# Patient Record
Sex: Female | Born: 1975 | Race: White | Hispanic: No | Marital: Married | Smoking: Never smoker
Health system: Southern US, Community
[De-identification: ages and names within clinical notes are randomized; demographics above are authoritative.]

## PROBLEM LIST (undated history)

## (undated) DIAGNOSIS — J329 Chronic sinusitis, unspecified: Secondary | ICD-10-CM

## (undated) DIAGNOSIS — A048 Other specified bacterial intestinal infections: Secondary | ICD-10-CM

## (undated) DIAGNOSIS — K529 Noninfective gastroenteritis and colitis, unspecified: Secondary | ICD-10-CM

## (undated) DIAGNOSIS — K209 Esophagitis, unspecified without bleeding: Secondary | ICD-10-CM

## (undated) HISTORY — DX: Esophagitis, unspecified without bleeding: K20.90

## (undated) HISTORY — PX: TONSILLECTOMY: SUR1361

## (undated) HISTORY — DX: Other specified bacterial intestinal infections: A04.8

## (undated) HISTORY — PX: WISDOM TOOTH EXTRACTION: SHX21

## (undated) HISTORY — DX: Esophagitis, unspecified: K20.9

---

## 1999-02-15 ENCOUNTER — Other Ambulatory Visit: Admission: RE | Admit: 1999-02-15 | Discharge: 1999-02-15 | Payer: Self-pay | Admitting: Obstetrics & Gynecology

## 1999-03-01 ENCOUNTER — Ambulatory Visit (HOSPITAL_COMMUNITY): Admission: RE | Admit: 1999-03-01 | Discharge: 1999-03-01 | Payer: Self-pay | Admitting: Obstetrics & Gynecology

## 1999-07-13 ENCOUNTER — Other Ambulatory Visit: Admission: RE | Admit: 1999-07-13 | Discharge: 1999-07-13 | Payer: Self-pay | Admitting: Obstetrics & Gynecology

## 2000-03-24 ENCOUNTER — Ambulatory Visit (HOSPITAL_COMMUNITY): Admission: RE | Admit: 2000-03-24 | Discharge: 2000-03-24 | Payer: Self-pay | Admitting: Obstetrics and Gynecology

## 2000-06-06 ENCOUNTER — Inpatient Hospital Stay (HOSPITAL_COMMUNITY): Admission: AD | Admit: 2000-06-06 | Discharge: 2000-06-08 | Payer: Self-pay | Admitting: Obstetrics & Gynecology

## 2000-07-19 ENCOUNTER — Other Ambulatory Visit: Admission: RE | Admit: 2000-07-19 | Discharge: 2000-07-19 | Payer: Self-pay | Admitting: Obstetrics and Gynecology

## 2001-08-15 ENCOUNTER — Other Ambulatory Visit: Admission: RE | Admit: 2001-08-15 | Discharge: 2001-08-15 | Payer: Self-pay | Admitting: Obstetrics & Gynecology

## 2003-01-15 ENCOUNTER — Encounter: Admission: RE | Admit: 2003-01-15 | Discharge: 2003-01-15 | Payer: Self-pay | Admitting: *Deleted

## 2003-01-15 ENCOUNTER — Encounter: Payer: Self-pay | Admitting: *Deleted

## 2003-02-13 ENCOUNTER — Ambulatory Visit (HOSPITAL_COMMUNITY): Admission: RE | Admit: 2003-02-13 | Discharge: 2003-02-13 | Payer: Self-pay | Admitting: Emergency Medicine

## 2003-02-13 ENCOUNTER — Encounter: Payer: Self-pay | Admitting: Emergency Medicine

## 2003-02-14 ENCOUNTER — Other Ambulatory Visit: Admission: RE | Admit: 2003-02-14 | Discharge: 2003-02-14 | Payer: Self-pay | Admitting: Obstetrics & Gynecology

## 2004-06-14 ENCOUNTER — Other Ambulatory Visit: Admission: RE | Admit: 2004-06-14 | Discharge: 2004-06-14 | Payer: Self-pay | Admitting: Obstetrics & Gynecology

## 2004-12-21 ENCOUNTER — Other Ambulatory Visit: Admission: RE | Admit: 2004-12-21 | Discharge: 2004-12-21 | Payer: Self-pay | Admitting: Obstetrics & Gynecology

## 2005-09-02 ENCOUNTER — Emergency Department (HOSPITAL_COMMUNITY): Admission: EM | Admit: 2005-09-02 | Discharge: 2005-09-02 | Payer: Self-pay | Admitting: Family Medicine

## 2005-09-05 ENCOUNTER — Ambulatory Visit (HOSPITAL_COMMUNITY): Admission: RE | Admit: 2005-09-05 | Discharge: 2005-09-05 | Payer: Self-pay | Admitting: Emergency Medicine

## 2007-10-03 ENCOUNTER — Emergency Department (HOSPITAL_COMMUNITY): Admission: EM | Admit: 2007-10-03 | Discharge: 2007-10-03 | Payer: Self-pay | Admitting: Emergency Medicine

## 2008-06-18 ENCOUNTER — Emergency Department (HOSPITAL_COMMUNITY): Admission: EM | Admit: 2008-06-18 | Discharge: 2008-06-18 | Payer: Self-pay | Admitting: Family Medicine

## 2008-06-30 ENCOUNTER — Emergency Department (HOSPITAL_COMMUNITY): Admission: EM | Admit: 2008-06-30 | Discharge: 2008-06-30 | Payer: Self-pay | Admitting: Family Medicine

## 2009-11-24 ENCOUNTER — Ambulatory Visit: Payer: Self-pay | Admitting: Internal Medicine

## 2009-11-24 DIAGNOSIS — J309 Allergic rhinitis, unspecified: Secondary | ICD-10-CM | POA: Insufficient documentation

## 2009-11-24 DIAGNOSIS — D509 Iron deficiency anemia, unspecified: Secondary | ICD-10-CM

## 2009-11-24 DIAGNOSIS — R51 Headache: Secondary | ICD-10-CM

## 2009-11-24 DIAGNOSIS — R519 Headache, unspecified: Secondary | ICD-10-CM | POA: Insufficient documentation

## 2009-12-25 ENCOUNTER — Ambulatory Visit: Payer: Self-pay | Admitting: Internal Medicine

## 2010-01-27 ENCOUNTER — Ambulatory Visit: Payer: Self-pay | Admitting: Internal Medicine

## 2010-02-25 ENCOUNTER — Emergency Department (HOSPITAL_COMMUNITY): Admission: EM | Admit: 2010-02-25 | Discharge: 2010-02-25 | Payer: Self-pay | Admitting: Emergency Medicine

## 2010-04-29 ENCOUNTER — Emergency Department (HOSPITAL_COMMUNITY): Admission: EM | Admit: 2010-04-29 | Discharge: 2010-04-29 | Payer: Self-pay | Admitting: Emergency Medicine

## 2010-09-28 NOTE — Assessment & Plan Note (Signed)
Summary: new / umr / # /cd   Vital Signs:  Patient profile:   35 year old female Menstrual status:  regular LMP:     11/02/2009 Height:      63 inches Weight:      162.75 pounds BMI:     28.93 O2 Sat:      98 % on Room air Temp:     98.4 degrees F oral Pulse rate:   69 / minute Pulse rhythm:   regular Resp:     16 per minute BP sitting:   112 / 68  (left arm) Cuff size:   large  Vitals Entered By: Rock Nephew CMA (November 24, 2009 9:22 AM)  Nutrition Counseling: Patient's BMI is greater than 25 and therefore counseled on weight management options.  O2 Flow:  Room air CC: hot flashes, headache x 2 wks, Headaches, Headache Is Patient Diabetic? No Pain Assessment Patient in pain? yes     Location: head Type: throbbing Onset of pain  Constant LMP (date): 11/02/2009     Menstrual Status regular Enter LMP: 11/02/2009   Primary Care Provider:  Etta Grandchild MD  CC:  hot flashes, headache x 2 wks, Headaches, and Headache.  History of Present Illness:  Headaches      This is a 35 year old woman who presents with Headaches.  The patient reports sweats, but denies nausea, vomiting, tearing of eyes, nasal congestion, sinus pain, sinus pressure, photophobia, and phonophobia.  The headache is described as constant and throbbing.  The location of the pain is bitemporal.  The patient denies the following high-risk features: fever, neck pain/stiffness, vision loss or change, focal weakness, altered mental status, rash, trauma, pain worse with exertion, new type of headache, age >36 years, immunosuppression, concomitant infection, and anticoagulation use.  The headaches are precipitated by stress.  Prior treatment has included acetaminophen.    Headache HPI (Reviewed):      The patient comes in for an acute visit for headaches.  The current headache started approximately 11/16/2009.  She notes 5+ previous headaches similar to the current one.  The headaches will last anywhere from  30 minutes to several days at a time.  She has approximately 1 headaches per month.  Headaches have been occurring since age 35.  There is no family history of migraine headaches.        On a scale of 1-10, she describes the headache as a 3.  The headaches are not associated with an aura.  The location of the headaches are bitemporal.  Headache quality is throbbing or pulsating.        The patient denies first or worst H/A of life, change in frequency from prior H/A's, change in severity from prior H/A's, change in features from prior H/A's, new onset H/A's in middle-age or later, new or progressive H/A lasting days, H/A's with Valsalva (cough/sneeze), mylagia, fever, malaise, weight loss, scalp tenderness, jaw claudication, focal neurologic symptoms, confusion, seizures, and impaired level of consciousness.         Headache Treatment History:      She has tried acetaminophen-plain which was ineffective.     Preventive Screening-Counseling & Management  Alcohol-Tobacco     Alcohol drinks/day: 0     Smoking Status: never  Caffeine-Diet-Exercise     Does Patient Exercise: no      Drug Use:  no.    Current Medications (verified): 1)  None  Allergies (verified): 1)  ! 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    Physical Exam  General:  alert, well-developed, well-nourished, and well-hydrated.   Head:  normocephalic, atraumatic, no  abnormalities observed, and no abnormalities palpated.   Eyes:  vision grossly intact, pupils equal, pupils round, and pupils reactive to light.   Mouth:  Oral mucosa and oropharynx without lesions or exudates.  Teeth in good repair. Neck:  No deformities, masses, or tenderness noted. Lungs:  Normal respiratory effort, chest expands symmetrically. Lungs are clear to auscultation, no crackles or wheezes. Heart:  Normal rate and regular rhythm. S1 and S2 normal without gallop, murmur, click, rub or other extra sounds. Abdomen:  Bowel sounds positive,abdomen soft and non-tender without masses, organomegaly or hernias noted. Msk:  No deformity or scoliosis noted of thoracic or lumbar spine.   Pulses:  R and L carotid,radial,femoral,dorsalis pedis and posterior tibial pulses are full and equal bilaterally Extremities:  No clubbing, cyanosis, edema, or deformity noted with normal full range of motion of all joints.   Neurologic:  No cranial nerve deficits noted. Station and gait are normal. Plantar reflexes are down-going bilaterally. DTRs are symmetrical throughout. Sensory, motor and coordinative functions appear intact. Skin:  Intact without suspicious lesions or rashes Cervical Nodes:  no anterior cervical adenopathy and no posterior cervical adenopathy.   Axillary Nodes:  no R axillary adenopathy and no L axillary adenopathy.   Psych:  Cognition and judgment appear intact. Alert and cooperative with normal attention span and concentration. No apparent delusions, illusions, hallucinations  Headache Neurologic Exam:    Cranial Nerves Intact:  Yes    Focal Neurologic Deficit:  No    Motor Exam/Strength-Left:       Left Biceps Flexion ( ):  normal       Left Triceps Extension ( ):  normal       Left Hand Grasp ( ):  normal       Left Deltoid ( ):  normal       Left Hip Flexors ( ):  normal       Left Ankle Dorsiflexion (L5,L4):  normal       Left Great Toe Dorsiflexion (L5,L4):  normal        Left Heel Walk (L5,some L4):  normal       Left Single Squat & Rise-Quads (L4):  normal       Left Toe Walk-calf (S1):  normal    Motor Exam/Strength-Right:       Right Biceps Flexion ( ):  normal       Right Triceps Extension ( ):  normal       Right Hand Grasp ( ):  normal       Right Deltoid ( ):  normal       Right Hip Flexors ( ):  normal       Right Ankle Dorsiflexion (L5,L4):  normal       Right Great Toe Dorsiflexion (L5,L4):  normal       Right Heel Walk (L5,some L4):  normal       Right  Single Squat & Rise-Quads (L4):  normal       Right Toe Walk-calf (S1):  normal    Sensory Exam/Pinprick-Left:       Left Face:  normal       Left Upper Extremity:  normal       Left Lower Extremity:  normal       Left Medial Foot (L4):  normal       Left Dorsal Foot (L5):  normal       Left Lateral Foot (S1):  normal    Sensory Exam/Pinprick-Right:       Right Face:  normal       Right Upper Extremity:  normal       Right Lower Extremity:  normal       Right Medial Foot (L4):  normal       Right Dorsal Foot (L5):  normal       Right Lateral Foot (S1):  normal    Reflexes-Left:       Left Biceps ( ):  normal       Left Triceps ( ):  normal       Left Brachioradialis ( ):  normal       Left Knee Jerk (L4):  normal       Left Ankle Reflex (S1):  normal    Reflexes-Right:       Right Biceps ( ):  normal       Right Triceps ( ):  normal       Right Brachioradialis ( ):  normal       Right Knee Jerk (L4):  normal       Right Ankle Reflex (S1):  normal    Cerebellar:  normal    Gait:  normal    Speech:  normal   Impression & Recommendations:  Problem # 1:  HEADACHE (ICD-784.0) Assessment New  this sounds like a protracted migraine so she was given a dose of Sumavel in the office today. she recevied some relief from the HA but she is not inerested in the injection form of a triptan so she was given by mouth sample doses of a triptan.  Headache diary reviewed.  Her updated  medication list for this problem includes:    Maxalt 10 Mg Tabs (Rizatriptan benzoate) .Marland Kitchen... Take one as directed for migraine headache  Complete Medication List: 1)  Maxalt 10 Mg Tabs (Rizatriptan benzoate) .... Take one as directed for migraine headache  Other Orders: Admin of Therapeutic Inj  intramuscular or subcutaneous (16109) Depo- Medrol 40mg  (J1030) Depo- Medrol 80mg  (J1040)  Headache Assessment/Plan:    Headache Classification:  Migraine without aura  Patient Instructions: 1)  Please schedule a follow-up appointment in 1 month. Prescriptions: MAXALT 10 MG TABS (RIZATRIPTAN BENZOATE) take one as directed for migraine headache  #2 x 0   Entered and Authorized by:   Etta Grandchild MD   Signed by:   Etta Grandchild MD on 11/24/2009   Method used:   Samples Given   RxID:   [id number]    Medication Administration  Injection # 1:    Medication: Depo- Medrol 80mg     Diagnosis: ALLERGIC RHINITIS (ICD-477.9)    Route: IM    Site: R deltoid    Exp Date: 06/2012    Lot #: [id number]    Mfr: pfizer    Patient tolerated injection without complications    Given by: Rock Nephew CMA (November 24, 2009 9:56 AM)  Injection #  2:    Medication: Depo- Medrol 40mg     Diagnosis: ALLERGIC RHINITIS (ICD-477.9)    Route: IM    Site: R deltoid    Exp Date: 06/2012    Lot #: [id number]    Mfr: pfizer    Patient tolerated injection without complications    Given by: Rock Nephew CMA (November 24, 2009 9:56 AM)  Orders Added: 1)  Admin of Therapeutic Inj  intramuscular or subcutaneous [96372] 2)  Depo- Medrol 40mg  [J1030] 3)  Depo- Medrol 80mg  [J1040] 4)  New Patient Level IV [16109]

## 2010-09-28 NOTE — Assessment & Plan Note (Signed)
Summary: 1 MO ROV / NWS  #   Vital Signs:  Patient profile:   35 year old female Menstrual status:  regular Height:      63 inches Weight:      160 pounds BMI:     28.45 O2 Sat:      97 % on Room air Temp:     98.0 degrees F oral Pulse rate:   74 / minute Pulse rhythm:   regular Resp:     16 per minute BP sitting:   108 / 72  (left arm) Cuff size:   large  Vitals Entered By: Rock Nephew CMA (December 25, 2009 9:33 AM)  Nutrition Counseling: Patient's BMI is greater than 25 and therefore counseled on weight management options.  O2 Flow:  Room air CC: follow-up visit Is Patient Diabetic? No Pain Assessment Patient in pain? no        Primary Care Provider:  Etta Grandchild MD  CC:  follow-up visit.  History of Present Illness:  Follow-Up Visit      This is a 35 year old woman who presents for Follow-up visit.  The patient denies dizziness and syncope.  Since the last visit the patient notes no new problems or concerns.  The patient reports exercising occasionaly.  When questioned about possible medication side effects, the patient notes none.  Her HA resolved and she has not had to take any Maxalt. She feels well today.  Preventive Screening-Counseling & Management  Alcohol-Tobacco     Alcohol drinks/day: 0     Smoking Status: never  Hep-HIV-STD-Contraception     Hepatitis Risk: no risk noted     HIV Risk: no risk noted     STD Risk: no risk noted      Sexual History:  currently monogamous.        Drug Use:  never.        Blood Transfusions:  no.    Medications Prior to Update: 1)  Maxalt 10 Mg Tabs (Rizatriptan Benzoate) .... Take One As Directed For Migraine Headache  Current Medications (verified): 1)  Maxalt 10 Mg Tabs (Rizatriptan Benzoate) .... Take One As Directed For Migraine Headache  Allergies (verified): 1)  ! Amoxicillin  Past History:  Past Medical History: Reviewed history from 11/24/2009 and no changes required. Headache Anemia-iron  deficiency Allergic rhinitis  Past Surgical History: Reviewed history from 11/24/2009 and no changes required. Tonsillectomy  Family History: Reviewed history from 11/24/2009 and no changes required. Family History Thyroid disease  Social History: Reviewed history from 11/24/2009 and no changes required. 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  Physical Exam  General:  alert, well-developed, well-nourished, and well-hydrated.   Mouth:  Oral mucosa and oropharynx without lesions or exudates.  Teeth in good repair. Neck:  No deformities, masses, or tenderness noted. Lungs:  Normal respiratory effort, chest expands symmetrically. Lungs are clear to auscultation, no crackles or wheezes. Heart:  Normal rate and regular rhythm. S1 and S2 normal without gallop, murmur, click, rub or other extra sounds. Abdomen:  Bowel sounds positive,abdomen soft and non-tender without masses, organomegaly or hernias  noted. Msk:  No deformity or scoliosis noted of thoracic or lumbar spine.   Extremities:  No clubbing, cyanosis, edema, or deformity noted with normal full range of motion of all joints.   Neurologic:  No cranial nerve deficits noted. Station and gait are normal. Plantar reflexes are down-going bilaterally. DTRs are symmetrical throughout. Sensory, motor and coordinative functions appear intact. Skin:  Intact without suspicious lesions or rashes Psych:  Cognition and judgment appear intact. Alert and cooperative with normal attention span and concentration. No  apparent delusions, illusions, hallucinations   Impression & Recommendations:  Problem # 1:  HEADACHE (ICD-784.0) Assessment Improved  Her updated medication list for this problem includes:    Maxalt 10 Mg Tabs (Rizatriptan benzoate) .Marland Kitchen... Take one as directed for migraine headache  Headache diary reviewed.  Complete Medication List: 1)  Maxalt 10 Mg Tabs (Rizatriptan benzoate) .... Take one as directed for migraine headache  PAP Screening:    Hx Cervical Dysplasia in last 5 yrs? No    3 normal PAP smears in last 5 yrs? Yes    Reviewed PAP smear recommendations:  patient defers to GYN provider  Osteoporosis Risk Assessment:  Risk Factors for Fracture or Low Bone Density:   Race (White or Asian):     yes   Smoking status:       never  Immunization & Chemoprophylaxis:    Tetanus vaccine: Td  (05/14/2003)  Patient Instructions: 1)  Please schedule a follow-up appointment as needed.   Tetanus/Td Immunization History:    Tetanus/Td # 1:  Td (05/14/2003)

## 2011-04-08 ENCOUNTER — Inpatient Hospital Stay (INDEPENDENT_AMBULATORY_CARE_PROVIDER_SITE_OTHER)
Admission: RE | Admit: 2011-04-08 | Discharge: 2011-04-08 | Disposition: A | Discharge status: Home / Self Care | Payer: 59 | Source: Ambulatory Visit | Attending: Family Medicine | Admitting: Family Medicine

## 2011-04-08 DIAGNOSIS — R198 Other specified symptoms and signs involving the digestive system and abdomen: Secondary | ICD-10-CM

## 2011-05-30 LAB — POCT PREGNANCY, URINE: Preg Test, Ur: NEGATIVE

## 2011-08-28 ENCOUNTER — Emergency Department (HOSPITAL_COMMUNITY)
Admission: EM | Admit: 2011-08-28 | Discharge: 2011-08-28 | Disposition: A | Discharge status: Home / Self Care | Payer: 59 | Source: Home / Self Care | Attending: Emergency Medicine | Admitting: Emergency Medicine

## 2011-08-28 DIAGNOSIS — J329 Chronic sinusitis, unspecified: Secondary | ICD-10-CM

## 2011-08-28 DIAGNOSIS — H6500 Acute serous otitis media, unspecified ear: Secondary | ICD-10-CM

## 2011-08-28 HISTORY — DX: Chronic sinusitis, unspecified: J32.9

## 2011-08-28 MED ORDER — IBUPROFEN 600 MG PO TABS: 600.0000 mg | ORAL_TABLET | Freq: Four times a day (QID) | ORAL | Status: DC | PRN

## 2011-08-28 MED ORDER — DOXYCYCLINE HYCLATE 100 MG PO CAPS: 100.0000 mg | ORAL_CAPSULE | Freq: Two times a day (BID) | ORAL | Status: DC

## 2011-08-28 MED ORDER — PSEUDOEPHEDRINE-GUAIFENESIN ER 120-1200 MG PO TB12: 1.0000 | ORAL_TABLET | Freq: Two times a day (BID) | ORAL | Status: DC

## 2011-08-28 MED ORDER — FLUTICASONE PROPIONATE 50 MCG/ACT NA SUSP: 2.0000 | Freq: Every day | NASAL | Status: DC

## 2011-08-28 NOTE — ED Provider Notes (Signed)
History     CSN: [id number]  Arrival date & time 08/28/11  0935   First MD Initiated Contact with Patient 08/28/11 1023      Chief Complaint  Patient presents with  . Facial Pain    HPI Comments: Pt with URI like sx a week ago. C/o nasal congestion, postnasal drip, ST, nonproductive cough, ear fullness. Got better but several days ago c/o bilateral frontal sinus pain/pressure worse with bending forward/lying down, purulent nasal d/c, L ear pain. No fevers, N/V, other HA, bodyaches, dental pain, change in hearing. Taking otc cold meds w/o relief. No known sick contacts. States feels simlar to prev episodes of sinusitis.     The history is provided by the patient.    Past Medical History  Diagnosis Date  . Sinusitis     Past Surgical History  Procedure Date  . Tonsillectomy     History reviewed. No pertinent family history.  History  Substance Use Topics  . Smoking status: Never Smoker   . Smokeless tobacco: Not on file  . Alcohol Use: No    OB History    Grav Para Term Preterm Abortions TAB SAB Ect Mult Living                  Review of Systems  Constitutional: Negative.  Negative for fever.  HENT: Positive for ear pain, congestion, sore throat, postnasal drip and sinus pressure. Negative for hearing loss, facial swelling, trouble swallowing, dental problem, voice change and ear discharge.   Gastrointestinal: Negative for nausea and vomiting.  Skin: Negative for rash.  Neurological: Positive for headaches.    Allergies  Amoxicillin  Home Medications   Current Outpatient Rx  Name Route Sig Dispense Refill  . ASPIRIN EFFERVESCENT 325 MG PO TBEF Oral Take 325 mg by mouth every 6 (six) hours as needed.      Marland Kitchen DOXYCYCLINE HYCLATE 100 MG PO CAPS Oral Take 1 capsule (100 mg total) by mouth 2 (two) times daily. X 7 days 14 capsule 0  . FLUTICASONE PROPIONATE 50 MCG/ACT NA SUSP Nasal Place 2 sprays into the nose daily. 16 g 0  . IBUPROFEN 600 MG PO TABS Oral Take  1 tablet (600 mg total) by mouth every 6 (six) hours as needed for pain. 30 tablet 0  . PSEUDOEPHEDRINE-GUAIFENESIN [phone] MG PO TB12 Oral Take 1 tablet by mouth 2 (two) times daily. 20 each 0    BP 120/82  Pulse 65  Temp(Src) 98.1 F (36.7 C) (Oral)  Resp 14  SpO2 98%  LMP 08/23/2011  Physical Exam  Nursing note and vitals reviewed. Constitutional: She is oriented to person, place, and time. She appears well-developed and well-nourished. No distress.  HENT:  Head: Normocephalic and atraumatic.  Right Ear: Tympanic membrane is not retracted. A middle ear effusion is present.  Left Ear: Tympanic membrane is erythematous and bulging. A middle ear effusion is present.  Nose: Mucosal edema and rhinorrhea present. Right sinus exhibits maxillary sinus tenderness and frontal sinus tenderness. Left sinus exhibits maxillary sinus tenderness and frontal sinus tenderness.  Mouth/Throat: Uvula is midline, oropharynx is clear and moist and mucous membranes are normal.  Eyes: Conjunctivae and EOM are normal. Pupils are equal, round, and reactive to light.  Neck: Normal range of motion. Neck supple.  Cardiovascular: Normal rate, regular rhythm and normal heart sounds.   Pulmonary/Chest: Effort normal and breath sounds normal.  Abdominal: She exhibits no distension.  Musculoskeletal: Normal range of motion.  Lymphadenopathy:  She has no cervical adenopathy.  Neurological: She is alert and oriented to person, place, and time.  Skin: Skin is warm and dry.  Psychiatric: She has a normal mood and affect. Her behavior is normal. Judgment and thought content normal.    ED Course  Procedures (including critical care time)  Labs Reviewed - No data to display No results found.   1. Sinusitis   2. Acute serous otitis media       MDM  Pt with true allergy to amoxicillin. Starting on doxy, decongestants, saline nasal irrigation, will have her use afin at night x 3 days.   Luiz Blare, MD 08/28/11 [id number]

## 2011-08-28 NOTE — ED Notes (Signed)
Pt had cold symptoms that started on Sunday and felt better on Wed and now has sinus congestion and earache bilaterally

## 2011-09-05 ENCOUNTER — Other Ambulatory Visit: Payer: Self-pay

## 2011-09-05 ENCOUNTER — Encounter (HOSPITAL_COMMUNITY): Payer: Self-pay

## 2011-09-05 ENCOUNTER — Emergency Department (INDEPENDENT_AMBULATORY_CARE_PROVIDER_SITE_OTHER)
Admission: EM | Admit: 2011-09-05 | Discharge: 2011-09-05 | Disposition: A | Discharge status: Home / Self Care | Payer: 59 | Source: Home / Self Care

## 2011-09-05 DIAGNOSIS — R55 Syncope and collapse: Secondary | ICD-10-CM

## 2011-09-05 DIAGNOSIS — R51 Headache: Secondary | ICD-10-CM

## 2011-09-05 DIAGNOSIS — K5900 Constipation, unspecified: Secondary | ICD-10-CM

## 2011-09-05 LAB — POCT I-STAT, CHEM 8
BUN: 10 mg/dL (ref 6–23)
Calcium, Ion: 1.14 mmol/L (ref 1.12–1.32)
Chloride: 102 mEq/L (ref 96–112)
Glucose, Bld: 92 mg/dL (ref 70–99)
TCO2: 27 mmol/L (ref 0–100)

## 2011-09-05 MED ORDER — ACETAMINOPHEN 325 MG PO TABS
ORAL_TABLET | ORAL | Status: AC
  Filled 2011-09-05: qty 2

## 2011-09-05 MED ORDER — DOCUSATE SODIUM 100 MG PO CAPS: 100.0000 mg | ORAL_CAPSULE | Freq: Two times a day (BID) | ORAL | Status: AC | PRN

## 2011-09-05 MED ORDER — ONDANSETRON 4 MG PO TBDP
8.0000 mg | ORAL_TABLET | Freq: Once | ORAL | Status: AC
  Administered 2011-09-05: 8 mg via ORAL

## 2011-09-05 MED ORDER — ONDANSETRON 4 MG PO TBDP
ORAL_TABLET | ORAL | Status: AC
  Filled 2011-09-05: qty 2

## 2011-09-05 MED ORDER — ONDANSETRON HCL 4 MG PO TABS: 4.0000 mg | ORAL_TABLET | Freq: Four times a day (QID) | ORAL | Status: AC

## 2011-09-05 MED ORDER — ACETAMINOPHEN 325 MG PO TABS
650.0000 mg | ORAL_TABLET | Freq: Once | ORAL | Status: AC
  Administered 2011-09-05: 650 mg via ORAL

## 2011-09-05 NOTE — ED Notes (Signed)
Pt states the nausea is better

## 2011-09-05 NOTE — ED Notes (Signed)
1 day hx of  dizziness.  This am woke up and went to [facility] and became dizzy.  In addition was diaphoretic.  Pt. states she has headache as well.  Questionable passing out when she went to bathroom.  Was here 1 week ago with sinus infection.  Was tx with Doxycycline and completed dose yesterday.

## 2011-09-09 NOTE — ED Provider Notes (Signed)
History     CSN: [id number]  Arrival date & time 09/05/11  0827   None     Chief Complaint  Patient presents with  . Dizziness    1 day hx of  dizziness.  This am woke up and went to [facility] and became dizzy.  In addition was diaphoretic.  Pt. states she has headache as well.  Questionable passing out when she went to bathroom.      (Consider location/radiation/quality/duration/timing/severity/associated sxs/prior treatment) HPI Comments: Pt states she awoke at 4 or 5 am this morning feeling that she needed to have a BM. While sitting on the commode trying to pass stool she became lightheaded and nauseated. She called for her husband to help her. He states when got to the bathroom she was diaphoretic, and he had to assist her back to the bed. Pt states once she lied down on the bed she felt better and fell back to sleep. She had taken Dulcolax before she went to bed because she had been constipated and was having stomach cramping.  She did have a BM later in the morning after she awoke and feels better as far as the constipation. She has had no more dizziness, or diaphoresis since the episode early this morning. She denies chest pain or palpitations.    Past Medical History  Diagnosis Date  . Sinusitis     Past Surgical History  Procedure Date  . Tonsillectomy   . Tonsillectomy     History reviewed. No pertinent family history.  History  Substance Use Topics  . Smoking status: Never Smoker   . Smokeless tobacco: Not on file  . Alcohol Use: No    OB History    Grav Para Term Preterm Abortions TAB SAB Ect Mult Living                  Review of Systems  Constitutional: Negative for fever and chills.  Respiratory: Negative for cough, chest tightness and shortness of breath.   Cardiovascular: Negative for chest pain and palpitations.  Gastrointestinal: Positive for abdominal pain and constipation. Negative for nausea and vomiting.  Genitourinary: Negative for dysuria and  frequency.  Neurological: Positive for dizziness. Negative for syncope, weakness and headaches.    Allergies  Amoxicillin  Home Medications   Current Outpatient Rx  Name Route Sig Dispense Refill  . FLUTICASONE PROPIONATE 50 MCG/ACT NA SUSP Nasal Place 2 sprays into the nose daily. 16 g 0  . PSEUDOEPHEDRINE-GUAIFENESIN ER [phone] MG PO TB12 Oral Take 1 tablet by mouth 2 (two) times daily. 20 each 0  . DOCUSATE SODIUM 100 MG PO CAPS Oral Take 1 capsule (100 mg total) by mouth 2 (two) times daily as needed for constipation. 20 capsule 0  . ONDANSETRON HCL 4 MG PO TABS Oral Take 1 tablet (4 mg total) by mouth every 6 (six) hours. 12 tablet 0    BP 117/70  Pulse 84  Temp(Src) 98.6 F (37 C) (Oral)  Resp 18  SpO2 99%  LMP 08/23/2011  Physical Exam  Nursing note and vitals reviewed. Constitutional: She appears well-developed and well-nourished. No distress.  HENT:  Head: Normocephalic and atraumatic.  Right Ear: Tympanic membrane, external ear and ear canal normal.  Left Ear: Tympanic membrane, external ear and ear canal normal.  Nose: Nose normal.  Mouth/Throat: Uvula is midline, oropharynx is clear and moist and mucous membranes are normal. No oropharyngeal exudate, posterior oropharyngeal edema or posterior oropharyngeal erythema.  Neck: Neck supple.  Cardiovascular: Normal rate, regular rhythm and normal heart sounds.   Pulmonary/Chest: Effort normal and breath sounds normal. No respiratory distress.  Lymphadenopathy:    She has no cervical adenopathy.  Neurological: She is alert.  Skin: Skin is warm and dry.  Psychiatric: She has a normal mood and affect.    ED Course  Procedures (including critical care time)   Labs Reviewed  POCT I-STAT, CHEM 8  LAB REPORT - SCANNED   No results found.   1. Headache   2. Vasovagal near syncope   3. Constipation       MDM  EKG NSR, rate 72.  Vasovagal episose early this morning. No syncope.         Melody Comas, [place] 09/09/11 1311

## 2011-09-12 NOTE — ED Provider Notes (Signed)
Medical screening examination/treatment/procedure(s) were performed by non-physician practitioner and as supervising physician I was immediately available for consultation/collaboration.  Luiz Blare MD   Luiz Blare, MD 09/12/11 [phone]

## 2012-04-03 ENCOUNTER — Encounter (HOSPITAL_COMMUNITY): Payer: Self-pay | Admitting: Emergency Medicine

## 2012-04-03 ENCOUNTER — Emergency Department (HOSPITAL_COMMUNITY)
Admission: EM | Admit: 2012-04-03 | Discharge: 2012-04-03 | Disposition: A | Discharge status: Home / Self Care | Payer: 59 | Source: Home / Self Care

## 2012-04-03 DIAGNOSIS — J329 Chronic sinusitis, unspecified: Secondary | ICD-10-CM

## 2012-04-03 DIAGNOSIS — J069 Acute upper respiratory infection, unspecified: Secondary | ICD-10-CM

## 2012-04-03 MED ORDER — SALINE NASAL SPRAY 0.65 % NA SOLN: 1.0000 | NASAL | Status: DC | PRN

## 2012-04-03 MED ORDER — CETIRIZINE-PSEUDOEPHEDRINE ER 5-120 MG PO TB12: 1.0000 | ORAL_TABLET | Freq: Every day | ORAL | Status: AC

## 2012-04-03 NOTE — ED Notes (Signed)
A week ago Sunday returned form a camping trip.  This past weekend started coughing, losing voice, non-productive cough, denies fever.  Reports runny nose, reports drainage in throat.

## 2012-04-03 NOTE — ED Provider Notes (Signed)
History     CSN: [id number]  Arrival date & time 04/03/12  1730   None     Chief Complaint  Patient presents with  . URI    (Consider location/radiation/quality/duration/timing/severity/associated sxs/prior treatment) Patient is a 36 y.o. female presenting with URI. The history is provided by the patient.  URI  Shivon is a 36 y.o. female who complains of onset of cough symptoms for 7 days.  No sore throat + cough, non productive No pleuritic pain No wheezing + nasal congestion No post-nasal drainage + sinus pain/pressure No laryngitis No chest congestion No itchy/red eyes + earache No hemoptysis No SOB No chills/sweats No fever No nausea No vomiting No abdominal pain No diarrhea No skin rashes No fatigue No myalgias + headache  No ill contacts   Past Medical History  Diagnosis Date  . Sinusitis     Past Surgical History  Procedure Date  . Tonsillectomy   . Tonsillectomy     No family history on file.  History  Substance Use Topics  . Smoking status: Never Smoker   . Smokeless tobacco: Not on file  . Alcohol Use: No    OB History    Grav Para Term Preterm Abortions TAB SAB Ect Mult Living                  Review of Systems  All other systems reviewed and are negative.    Allergies  Amoxicillin  Home Medications   Current Outpatient Rx  Name Route Sig Dispense Refill  . SUDAFED 12 HOUR PO Oral Take by mouth.    . CETIRIZINE-PSEUDOEPHEDRINE ER 5-120 MG PO TB12 Oral Take 1 tablet by mouth daily. 30 tablet 0  . FLUTICASONE PROPIONATE 50 MCG/ACT NA SUSP Nasal Place 2 sprays into the nose daily. 16 g 0  . PSEUDOEPHEDRINE-GUAIFENESIN ER [phone] MG PO TB12 Oral Take 1 tablet by mouth 2 (two) times daily. 20 each 0  . SALINE NASAL SPRAY 0.65 % NA SOLN Nasal Place 1 spray into the nose as needed for congestion. 30 mL 12    BP 125/75  Pulse 70  Temp 98.5 F (36.9 C) (Oral)  Resp 16  SpO2 100%  LMP 03/12/2012  Physical Exam    Nursing note and vitals reviewed. Constitutional: She is oriented to person, place, and time. Vital signs are normal. She appears well-developed and well-nourished. She is active and cooperative.  HENT:  Head: Normocephalic.  Right Ear: Hearing, tympanic membrane, external ear and ear canal normal.  Left Ear: Hearing, tympanic membrane, external ear and ear canal normal.  Nose: Right sinus exhibits maxillary sinus tenderness. Right sinus exhibits no frontal sinus tenderness. Left sinus exhibits maxillary sinus tenderness. Left sinus exhibits no frontal sinus tenderness.  Mouth/Throat: Uvula is midline, oropharynx is clear and moist and mucous membranes are normal.       Right ear mildly bulging  Eyes: Conjunctivae are normal. Pupils are equal, round, and reactive to light. No scleral icterus.  Neck: Trachea normal. Neck supple.  Cardiovascular: Normal rate, regular rhythm, normal heart sounds and normal pulses.   Pulmonary/Chest: Effort normal and breath sounds normal. She exhibits no tenderness.  Lymphadenopathy:       Head (right side): No submental, no submandibular, no tonsillar, no preauricular, no posterior auricular and no occipital adenopathy present.       Head (left side): No submental, no submandibular, no tonsillar, no preauricular, no posterior auricular and no occipital adenopathy present.    She has  no cervical adenopathy.  Neurological: She is alert and oriented to person, place, and time. No cranial nerve deficit or sensory deficit.  Skin: Skin is warm and dry. No rash noted.  Psychiatric: She has a normal mood and affect. Her speech is normal and behavior is normal. Judgment and thought content normal. Cognition and memory are normal.    ED Course  Procedures (including critical care time)  Labs Reviewed - No data to display No results found.   1. Sinusitis   2. URI (upper respiratory infection)       MDM  Zyrtec daily, saline nasal spray as instructed.  Follow  up with primary care provider if symptoms are not improved.         Johnsie Kindred, NP 04/03/12 2055

## 2012-04-05 NOTE — ED Provider Notes (Signed)
Medical screening examination/treatment/procedure(s) were performed by non-physician practitioner and as supervising physician I was immediately available for consultation/collaboration.  Luiz Blare MD   Luiz Blare, MD 04/05/12 [phone]

## 2013-05-31 ENCOUNTER — Encounter: Payer: Self-pay | Admitting: Internal Medicine

## 2013-05-31 ENCOUNTER — Ambulatory Visit (INDEPENDENT_AMBULATORY_CARE_PROVIDER_SITE_OTHER): Payer: 59 | Admitting: Internal Medicine

## 2013-05-31 VITALS — BP 120/80 | Temp 98.7°F | Wt 173.2 lb

## 2013-05-31 DIAGNOSIS — F329 Major depressive disorder, single episode, unspecified: Secondary | ICD-10-CM

## 2013-05-31 DIAGNOSIS — IMO0002 Reserved for concepts with insufficient information to code with codable children: Secondary | ICD-10-CM

## 2013-05-31 DIAGNOSIS — R209 Unspecified disturbances of skin sensation: Secondary | ICD-10-CM

## 2013-05-31 DIAGNOSIS — T280XXA Burn of mouth and pharynx, initial encounter: Secondary | ICD-10-CM

## 2013-05-31 DIAGNOSIS — F341 Dysthymic disorder: Secondary | ICD-10-CM

## 2013-05-31 DIAGNOSIS — R200 Anesthesia of skin: Secondary | ICD-10-CM

## 2013-05-31 DIAGNOSIS — T285XXA Corrosion of mouth and pharynx, initial encounter: Secondary | ICD-10-CM

## 2013-05-31 MED ORDER — FLUOXETINE HCL 20 MG PO TABS: 20.0000 mg | ORAL_TABLET | Freq: Every day | ORAL | Status: DC

## 2013-05-31 NOTE — Progress Notes (Signed)
Subjective:    Patient ID: Vanessa Byrd, female    DOB: 1975/10/16, 37 y.o.   MRN: [id number]  HPI  Pt presents to the clinic today with c/o tongue numbness. This started on Sunday. She did eat at church for homecoming. She bit into a piece of cornbread that had jalapeno in it. It was very hot. She does not tolerate spicy foods. She felt like it actually burnt the left side of her tongue. Since that time it has been numb. She is also concerned that it may be Bell's Palsy. Her mother and her husband both have had bell's palsy in the past. Additionally, she c/o anxiety and depression. The depression is mild but the anxiety is the issues. She has started to have panic attacks a few times a week. She is under a lot of stress with her job. She has been on Effexor in the past but did not tolerate it well. She denies SI/HI.  Review of Systems      Past Medical History  Diagnosis Date  . Sinusitis     Current Outpatient Prescriptions  Medication Sig Dispense Refill  . fluticasone (FLONASE) 50 MCG/ACT nasal spray Place 2 sprays into the nose daily.  16 g  0  . Pseudoephedrine HCl (SUDAFED 12 HOUR PO) Take by mouth.      . Pseudoephedrine-Guaifenesin (MUCINEX D) [phone] MG TB12 Take 1 tablet by mouth 2 (two) times daily.  20 each  0  . sodium chloride (OCEAN NASAL SPRAY) 0.65 % nasal spray Place 1 spray into the nose as needed for congestion.  30 mL  12   No current facility-administered medications for this visit.    Allergies  Allergen Reactions  . Amoxicillin     REACTION: swelling. angioedema    History reviewed. No pertinent family history.  History   Social History  . Marital Status: Married    Spouse Name: N/A    Number of Children: N/A  . Years of Education: N/A   Occupational History  . Not on file.   Social History Main Topics  . Smoking status: Never Smoker   . Smokeless tobacco: Not on file  . Alcohol Use: No  . Drug Use: No  . Sexual Activity: Not on  file   Other Topics Concern  . Not on file   Social History Narrative  . No narrative on file     Constitutional: Denies fever, malaise, fatigue, headache or abrupt weight changes.  HEENT: Pt reports tongue numbness. Denies eye pain, eye redness, ear pain, ringing in the ears, wax buildup, runny nose, nasal congestion, bloody nose, or sore throat. Skin: Denies redness, rashes, lesions or ulcercations.  Neurological: Denies dizziness, difficulty with memory, difficulty with speech or problems with balance and coordination.  Psych: Pt reports anxiety and depression. Denies SI/HI.  No other specific complaints in a complete review of systems (except as listed in HPI above).  Objective:   Physical Exam   BP 120/80  Temp(Src) 98.7 F (37.1 C) (Oral)  Wt 173 lb 4 oz (78.586 kg)  BMI 30.7 kg/m2  SpO2 99% Wt Readings from Last 3 Encounters:  05/31/13 173 lb 4 oz (78.586 kg)  12/25/09 160 lb (72.576 kg)  11/24/09 162 lb 12 oz (73.823 kg)    General: Appears her stated age,overweight but  well developed, well nourished in NAD. Skin: Warm, dry and intact. No rashes, lesions or ulcerations noted. HEENT: Head: normal shape and size; Eyes: sclera white, no icterus, conjunctiva  pink, PERRLA and EOMs intact; Ears: Tm's gray and intact, normal light reflex; Nose: mucosa pink and moist, septum midline; Throat/Mouth: Teeth present, mucosa pink and moist, no exudate, lesions or ulcerations noted.  Tongue with smooth taste buds on left side ? Burn. Cardiovascular: Normal rate and rhythm. S1,S2 noted.  No murmur, rubs or gallops noted. No JVD or BLE edema. No carotid bruits noted. Pulmonary/Chest: Normal effort and positive vesicular breath sounds. No respiratory distress. No wheezes, rales or ronchi noted.  Neurological: Alert and oriented. Cranial nerves II-XII intact. Coordination normal. +DTRs bilaterally. Psych: Mood anxious but affect normal. Thought content and behavior normal.  BMET     Component Value Date/Time   NA 140 09/05/2011 1018   K 3.5 09/05/2011 1018   CL 102 09/05/2011 1018   GLUCOSE 92 09/05/2011 1018   BUN 10 09/05/2011 1018   CREATININE 0.90 09/05/2011 1018    Lipid Panel  No results found for this basename: chol, trig, hdl, cholhdl, vldl, ldlcalc    CBC    Component Value Date/Time   HGB 15.0 09/05/2011 1018   HCT 44.0 09/05/2011 1018    Hgb A1C No results found for this basename: HGBA1C        Assessment & Plan:   Left tongue numbness, new onset:  Likely chemical burn from jalopeno No evidence of bells palsy- neuro exam normal

## 2013-05-31 NOTE — Assessment & Plan Note (Signed)
Failed effexor Will trial prozac 20 mg  RTC in 1 month to assess medication effectiveness

## 2013-05-31 NOTE — Patient Instructions (Addendum)

## 2013-09-02 ENCOUNTER — Ambulatory Visit: Payer: 59 | Admitting: Internal Medicine

## 2013-09-18 ENCOUNTER — Encounter: Payer: Self-pay | Admitting: Internal Medicine

## 2013-09-18 ENCOUNTER — Ambulatory Visit (INDEPENDENT_AMBULATORY_CARE_PROVIDER_SITE_OTHER): Payer: 59 | Admitting: Internal Medicine

## 2013-09-18 VITALS — BP 122/70 | HR 56 | Temp 98.8°F | Wt 169.5 lb

## 2013-09-18 DIAGNOSIS — J069 Acute upper respiratory infection, unspecified: Secondary | ICD-10-CM

## 2013-09-18 NOTE — Progress Notes (Signed)
HPI  Pt presents to the clinic today with c/o headache, dizziness, ear pain, fatigue, diarrhea and nausea. This started about 4 days ago. The headache seems to come and go. The headache is located near her temples. The diarrhea and nausea seems to have resolved but she still has no appetite. She has had fever, chills or body aches. She has not taken anything OTC. She has had her flu shot. She has had sick contact.  Review of Systems      Past Medical History  Diagnosis Date  . Sinusitis     No family history on file.  History   Social History  . Marital Status: Married    Spouse Name: N/A    Number of Children: N/A  . Years of Education: N/A   Occupational History  . Not on file.   Social History Main Topics  . Smoking status: Never Smoker   . Smokeless tobacco: Not on file  . Alcohol Use: No  . Drug Use: No  . Sexual Activity: Not on file   Other Topics Concern  . Not on file   Social History Narrative  . No narrative on file    Allergies  Allergen Reactions  . Amoxicillin     REACTION: swelling. angioedema     Constitutional: Positive headache, fatigue and fever. Denies abrupt weight changes.  HEENT:  Pt reports ear pain. Denies eye redness, eye pain, pressure behind the eyes, facial pain, nasal congestion, ringing in the ears, wax buildup, runny nose or bloody nose. Respiratory: Denies difficulty breathing or shortness of breath.  Cardiovascular: Denies chest pain, chest tightness, palpitations or swelling in the hands or feet.   No other specific complaints in a complete review of systems (except as listed in HPI above).  Objective:   BP 122/70  Pulse 56  Temp(Src) 98.8 F (37.1 C) (Oral)  Wt 169 lb 8 oz (76.885 kg)  SpO2 98%  LMP 09/08/2013 Wt Readings from Last 3 Encounters:  09/18/13 169 lb 8 oz (76.885 kg)  05/31/13 173 lb 4 oz (78.586 kg)  12/25/09 160 lb (72.576 kg)     General: Appears her stated age, well developed, well nourished in  NAD. HEENT: Head: normal shape and size; Eyes: sclera white, no icterus, conjunctiva pink, PERRLA and EOMs intact; Ears: Tm's gray and intact, normal light reflex; Nose: mucosa pink and moist, septum midline; Throat/Mouth: + PND. Teeth present, mucosa erythematous and moist, no exudate noted, no lesions or ulcerations noted.  Neck: Neck supple, trachea midline. No massses, lumps or thyromegaly present.  Cardiovascular: Normal rate and rhythm. S1,S2 noted.  No murmur, rubs or gallops noted. No JVD or BLE edema. No carotid bruits noted. Pulmonary/Chest: Normal effort and positive vesicular breath sounds. No respiratory distress. No wheezes, rales or ronchi noted.      Assessment & Plan:   Upper Respiratory Infection, likely viral:  Get some rest and drink plenty of water Can take Sudafed or Zyrtec OTC  RTC as needed or if symptoms persist.

## 2013-09-18 NOTE — Progress Notes (Signed)
Pre-visit discussion using our clinic review tool. No additional management support is needed unless otherwise documented below in the visit note.  

## 2013-09-18 NOTE — Patient Instructions (Signed)

## 2013-10-23 ENCOUNTER — Encounter (HOSPITAL_COMMUNITY): Payer: Self-pay | Admitting: Emergency Medicine

## 2013-10-23 ENCOUNTER — Emergency Department (HOSPITAL_COMMUNITY)
Admission: EM | Admit: 2013-10-23 | Discharge: 2013-10-23 | Disposition: A | Discharge status: Home / Self Care | Payer: 59 | Source: Home / Self Care | Attending: Family Medicine | Admitting: Family Medicine

## 2013-10-23 DIAGNOSIS — A048 Other specified bacterial intestinal infections: Secondary | ICD-10-CM

## 2013-10-23 LAB — CBC WITH DIFFERENTIAL/PLATELET
Basophils Absolute: 0 10*3/uL (ref 0.0–0.1)
Basophils Relative: 0 % (ref 0–1)
Eosinophils Absolute: 0.1 10*3/uL (ref 0.0–0.7)
Eosinophils Relative: 1 % (ref 0–5)
HCT: 39.9 % (ref 36.0–46.0)
HEMOGLOBIN: 13.2 g/dL (ref 12.0–15.0)
LYMPHS ABS: 2.6 10*3/uL (ref 0.7–4.0)
Lymphocytes Relative: 28 % (ref 12–46)
MCH: 26.7 pg (ref 26.0–34.0)
MCHC: 33.1 g/dL (ref 30.0–36.0)
MCV: 80.8 fL (ref 78.0–100.0)
MONOS PCT: 5 % (ref 3–12)
Monocytes Absolute: 0.5 10*3/uL (ref 0.1–1.0)
NEUTROS ABS: 6.2 10*3/uL (ref 1.7–7.7)
NEUTROS PCT: 66 % (ref 43–77)
Platelets: 306 10*3/uL (ref 150–400)
RBC: 4.94 MIL/uL (ref 3.87–5.11)
RDW: 14.2 % (ref 11.5–15.5)
WBC: 9.4 10*3/uL (ref 4.0–10.5)

## 2013-10-23 LAB — COMPREHENSIVE METABOLIC PANEL
ALK PHOS: 63 U/L (ref 39–117)
ALT: 16 U/L (ref 0–35)
AST: 14 U/L (ref 0–37)
Albumin: 3.9 g/dL (ref 3.5–5.2)
BILIRUBIN TOTAL: 0.8 mg/dL (ref 0.3–1.2)
BUN: 9 mg/dL (ref 6–23)
CHLORIDE: 104 meq/L (ref 96–112)
CO2: 25 meq/L (ref 19–32)
Calcium: 9.2 mg/dL (ref 8.4–10.5)
Creatinine, Ser: 0.73 mg/dL (ref 0.50–1.10)
GFR calc non Af Amer: >90 mL/min (ref >90)
GLUCOSE: 113 mg/dL — AB (ref 70–99)
POTASSIUM: 3.6 meq/L — AB (ref 3.7–5.3)
Sodium: 141 mEq/L (ref 137–147)
Total Protein: 7 g/dL (ref 6.0–8.3)

## 2013-10-23 LAB — POCT H PYLORI SCREEN: H. PYLORI SCREEN, POC: POSITIVE — AB

## 2013-10-23 LAB — POCT URINALYSIS DIP (DEVICE)
Bilirubin Urine: NEGATIVE
Glucose, UA: NEGATIVE mg/dL
Hgb urine dipstick: NEGATIVE
Ketones, ur: NEGATIVE mg/dL
Nitrite: NEGATIVE
PROTEIN: NEGATIVE mg/dL
Specific Gravity, Urine: 1.02 (ref 1.005–1.030)
UROBILINOGEN UA: 0.2 mg/dL (ref 0.0–1.0)
pH: 6.5 (ref 5.0–8.0)

## 2013-10-23 LAB — LIPASE, BLOOD: LIPASE: 33 U/L (ref 11–59)

## 2013-10-23 LAB — POCT PREGNANCY, URINE: PREG TEST UR: NEGATIVE

## 2013-10-23 MED ORDER — TETRACYCLINE HCL 500 MG PO CAPS: 500.0000 mg | ORAL_CAPSULE | Freq: Four times a day (QID) | ORAL | Status: DC

## 2013-10-23 MED ORDER — ONDANSETRON 4 MG PO TBDP: 4.0000 mg | ORAL_TABLET | Freq: Three times a day (TID) | ORAL | Status: DC | PRN

## 2013-10-23 MED ORDER — ONDANSETRON 4 MG PO TBDP
4.0000 mg | ORAL_TABLET | Freq: Once | ORAL | Status: AC
  Administered 2013-10-23: 4 mg via ORAL

## 2013-10-23 MED ORDER — METRONIDAZOLE 500 MG PO TABS: 500.0000 mg | ORAL_TABLET | Freq: Two times a day (BID) | ORAL | Status: DC

## 2013-10-23 MED ORDER — OMEPRAZOLE 20 MG PO CPDR: 20.0000 mg | DELAYED_RELEASE_CAPSULE | Freq: Two times a day (BID) | ORAL | Status: DC

## 2013-10-23 MED ORDER — ONDANSETRON 4 MG PO TBDP
ORAL_TABLET | ORAL | Status: AC
  Filled 2013-10-23: qty 1

## 2013-10-23 NOTE — ED Notes (Signed)
Pt  Reports  abd   Pain   With  Nausea       Sweaty       X  1  Month  Worse  Recently       This  Am         Felt  Weak   Almost  Passed  Out        But  According to  Husband  Remained  Awake

## 2013-10-23 NOTE — Discharge Instructions (Signed)
Helicobacter Pylori Antibodies Test °This is a blood test which looks for a germ called Helicobacter pylori. This can also be diagnosed by a breath test or a microscopic examination of a portion (biopsy) of the small bowel. H. pylori is a germ that is found in the cells that line the stomach. It is a risk factor for stomach and small bowel ulcers, long standing inflammation of the lining of the stomach, or even ulcers that may occur in the esophagus (the canal that runs from the mouth to the stomach). This bacterium is also a factor in stomach cancer. The amount of the bacteria is found in about 10% of healthy persons younger than 39 years of age and the amount of the bacteria increases with age. Most persons with these bacteria have no symptoms; however, it is thought that when these bacteria cause ulcers, antibiotic medications can be used to help eliminate or reduce the problem.  °PREPARATION FOR TEST °No preparation or fasting is necessary. °NORMAL FINDINGS °Negative (H-pylori bacteria not present) °Ranges for normal findingsmay vary among different laboratories and hospitals. You should always check with your doctor after having lab work or other tests done to discuss the meaning of your test results and whether or not your values are considered within normal limits. °MEANING OF TEST  °Your caregiver will go over the test results with you and discuss the importance and meaning of your results, as well as treatment options and the need for additional tests if necessary. °It is your responsibility to obtain your test results. Ask the lab or department performing the test when and how you will get your results. °OBTAINING THE TEST RESULTS °It is your responsibility to obtain your test results. Ask the lab or department performing the test when and how you will get your results. °Document Released: 09/08/2004 Document Revised: 11/07/2011 Document Reviewed: 07/26/2008 °Exit[employer]® Patient Information ©2014 [employer]. ° °

## 2013-10-23 NOTE — ED Provider Notes (Signed)
CSN: [id number]     Arrival date & time 10/23/13  5093 History   First MD Initiated Contact with Patient 10/23/13 [phone]     Chief Complaint  Patient presents with  . Abdominal Pain     (Consider location/radiation/quality/duration/timing/severity/associated sxs/prior Treatment) Patient is a 38 y.o. female presenting with abdominal pain. The history is provided by the patient. No language interpreter was used.  Abdominal Pain Pain location:  Generalized Pain quality: aching and gnawing   Pain radiates to:  Does not radiate Pain severity:  No pain Onset quality:  Gradual Duration:  4 weeks Progression:  Worsening Chronicity:  New Relieved by:  Nothing Worsened by:  Nothing tried Ineffective treatments:  None tried Associated symptoms: no chest pain, no fever and no vaginal bleeding     Past Medical History  Diagnosis Date  . Sinusitis    Past Surgical History  Procedure Laterality Date  . Tonsillectomy    . Tonsillectomy     No family history on file. History  Substance Use Topics  . Smoking status: Never Smoker   . Smokeless tobacco: Not on file  . Alcohol Use: No   OB History   Grav Para Term Preterm Abortions TAB SAB Ect Mult Living                 Review of Systems  Constitutional: Negative for fever.  Cardiovascular: Negative for chest pain.  Gastrointestinal: Positive for abdominal pain.  Genitourinary: Negative for vaginal bleeding.  All other systems reviewed and are negative.      Allergies  Amoxicillin  Home Medications   Current Outpatient Rx  Name  Route  Sig  Dispense  Refill  . FLUoxetine (PROZAC) 20 MG tablet   Oral   Take 1 tablet (20 mg total) by mouth daily.   30 tablet   3   . EXPIRED: fluticasone (FLONASE) 50 MCG/ACT nasal spray   Nasal   Place 2 sprays into the nose daily.   16 g   0   . Pseudoephedrine HCl (SUDAFED 12 HOUR PO)   Oral   Take by mouth.         . Pseudoephedrine-Guaifenesin (MUCINEX D) [phone] MG  TB12   Oral   Take 1 tablet by mouth 2 (two) times daily.   20 each   0   . EXPIRED: sodium chloride (OCEAN NASAL SPRAY) 0.65 % nasal spray   Nasal   Place 1 spray into the nose as needed for congestion.   30 mL   12    BP 118/72  Pulse 76  Temp(Src) 98.6 F (37 C) (Oral)  Resp 20  SpO2 100%  LMP 10/09/2013 Physical Exam  Nursing note and vitals reviewed. Constitutional: She is oriented to person, place, and time. She appears well-developed and well-nourished.  HENT:  Head: Normocephalic.  Right Ear: External ear normal.  Nose: Nose normal.  Mouth/Throat: Oropharynx is clear and moist.  Eyes: Conjunctivae and EOM are normal. Pupils are equal, round, and reactive to light.  Neck: Normal range of motion.  Cardiovascular: Normal rate and normal heart sounds.   Pulmonary/Chest: Effort normal and breath sounds normal.  Abdominal: Bowel sounds are normal. She exhibits no distension. There is tenderness.  Musculoskeletal: Normal range of motion.  Neurological: She is alert and oriented to person, place, and time.  Skin: Skin is warm.  Psychiatric: She has a normal mood and affect.    ED Course  Procedures (including critical care time) Labs Review Labs  Reviewed  POCT URINALYSIS DIP (DEVICE) - Abnormal; Notable for the following:    Leukocytes, UA SMALL (*)    All other components within normal limits  CBC WITH DIFFERENTIAL  COMPREHENSIVE METABOLIC PANEL  LIPASE, BLOOD  URINALYSIS, DIPSTICK ONLY  POCT PREGNANCY, URINE   Imaging Review No results found.    MDM   Final diagnoses:  H. pylori infection    Positive h pylori.    Pt given rx for flagyl. Tetracycline, zofran and prilosec.   Pt advised to see her MD for recheck.   Pt counseled on signs and symptoms of worsening illness that would indicate more severe process and need to go to ED  Fransico Meadow, PA-C 10/23/13 1708

## 2013-10-25 NOTE — ED Provider Notes (Signed)
Medical screening examination/treatment/procedure(s) were performed by a resident physician or non-physician practitioner and as the supervising physician I was immediately available for consultation/collaboration.  Lynne Leader, MD    Gregor Hams, MD 10/25/13 [phone]

## 2013-10-28 ENCOUNTER — Telehealth: Payer: Self-pay

## 2013-10-28 NOTE — Telephone Encounter (Signed)
Pt called and thought she had missed a call from our office. I asked her about Scarlette Calico as PCP and she stated she switched to you back in January at the [facility] office. I informed her that someone will get back to her as soon as you get a chance to look at the note. Pt .agreed and understood

## 2013-10-28 NOTE — Telephone Encounter (Signed)
Pt left v/m; was recently seen at [facility] and pt was advised had H pylori and recommended pt contact PCP for referral to Gastroenterologist.Please advise. [facility] note advised pt to f/u with PCP.  Does pt need to schedule appt with  Webb Silversmith NP. PCP listed as Scarlette Calico.

## 2013-10-28 NOTE — Telephone Encounter (Signed)
She has to be seen before I can place a referral

## 2013-10-29 ENCOUNTER — Telehealth: Payer: Self-pay | Admitting: Internal Medicine

## 2013-10-29 NOTE — Telephone Encounter (Signed)
Pt is aware of no openings for today and she already has an appt for tomorrow and will keep that appt

## 2013-10-29 NOTE — Telephone Encounter (Signed)
Patient needs a referral to GI for H-Pylori.  Patient has appointment tomorrow,but she had a rough night last night and this morning.  Patient is asking if she can be seen sooner than tomorrow.

## 2013-10-30 ENCOUNTER — Encounter: Payer: Self-pay | Admitting: Internal Medicine

## 2013-10-30 ENCOUNTER — Ambulatory Visit (INDEPENDENT_AMBULATORY_CARE_PROVIDER_SITE_OTHER): Payer: 59 | Admitting: Internal Medicine

## 2013-10-30 VITALS — BP 110/76 | HR 77 | Temp 98.4°F | Wt 162.0 lb

## 2013-10-30 DIAGNOSIS — R1013 Epigastric pain: Secondary | ICD-10-CM

## 2013-10-30 DIAGNOSIS — A048 Other specified bacterial intestinal infections: Secondary | ICD-10-CM

## 2013-10-30 DIAGNOSIS — R110 Nausea: Secondary | ICD-10-CM

## 2013-10-30 NOTE — Patient Instructions (Addendum)
Helicobacter Pylori Antibodies Test °This is a blood test which looks for a germ called Helicobacter pylori. This can also be diagnosed by a breath test or a microscopic examination of a portion (biopsy) of the small bowel. H. pylori is a germ that is found in the cells that line the stomach. It is a risk factor for stomach and small bowel ulcers, long standing inflammation of the lining of the stomach, or even ulcers that may occur in the esophagus (the canal that runs from the mouth to the stomach). This bacterium is also a factor in stomach cancer. The amount of the bacteria is found in about 10% of healthy persons younger than 39 years of age and the amount of the bacteria increases with age. Most persons with these bacteria have no symptoms; however, it is thought that when these bacteria cause ulcers, antibiotic medications can be used to help eliminate or reduce the problem.  °PREPARATION FOR TEST °No preparation or fasting is necessary. °NORMAL FINDINGS °Negative (H-pylori bacteria not present) °Ranges for normal findingsmay vary among different laboratories and hospitals. You should always check with your doctor after having lab work or other tests done to discuss the meaning of your test results and whether or not your values are considered within normal limits. °MEANING OF TEST  °Your caregiver will go over the test results with you and discuss the importance and meaning of your results, as well as treatment options and the need for additional tests if necessary. °It is your responsibility to obtain your test results. Ask the lab or department performing the test when and how you will get your results. °OBTAINING THE TEST RESULTS °It is your responsibility to obtain your test results. Ask the lab or department performing the test when and how you will get your results. °Document Released: 09/08/2004 Document Revised: 11/07/2011 Document Reviewed: 07/26/2008 °ExitCare® Patient Information ©2014 [employer]  [employer]. ° °

## 2013-10-30 NOTE — Progress Notes (Signed)
Pre visit review using our clinic review tool, if applicable. No additional management support is needed unless otherwise documented below in the visit note. 

## 2013-10-30 NOTE — Progress Notes (Signed)
Subjective:    Patient ID: Vanessa Byrd, female    DOB: 01-Jul-1976, 38 y.o.   MRN: [id number]  HPI  Pt presents to the clinic today reporting that she has not been feeling well x 1 week. She did have a syncopal episode. She did go to UC where she was diagnosed with H Pylori. She is currently undergoing treatment.The medications have not changed how she is feeling. She continues to have upper abdominal pain, and nausea. She denies vomiting or blood in her stool. The abdominal pain does not change in reference to eating.  She is having loose stools daily.  Review of Systems      Past Medical History  Diagnosis Date  . Sinusitis     Current Outpatient Prescriptions  Medication Sig Dispense Refill  . FLUoxetine (PROZAC) 20 MG tablet Take 1 tablet (20 mg total) by mouth daily.  30 tablet  3  . metroNIDAZOLE (FLAGYL) 500 MG tablet Take 1 tablet (500 mg total) by mouth 2 (two) times daily.  20 tablet  0  . omeprazole (PRILOSEC) 20 MG capsule Take 1 capsule (20 mg total) by mouth 2 (two) times daily before a meal.  28 capsule  0  . ondansetron (ZOFRAN ODT) 4 MG disintegrating tablet Take 1 tablet (4 mg total) by mouth every 8 (eight) hours as needed for nausea or vomiting.  20 tablet  0  . Pseudoephedrine HCl (SUDAFED 12 HOUR PO) Take by mouth.      . tetracycline (ACHROMYCIN,SUMYCIN) 500 MG capsule Take 1 capsule (500 mg total) by mouth 4 (four) times daily.  40 capsule  0  . fluticasone (FLONASE) 50 MCG/ACT nasal spray Place 2 sprays into the nose daily.  16 g  0  . sodium chloride (OCEAN NASAL SPRAY) 0.65 % nasal spray Place 1 spray into the nose as needed for congestion.  30 mL  12   No current facility-administered medications for this visit.    Allergies  Allergen Reactions  . Amoxicillin     REACTION: swelling. angioedema    No family history on file.  History   Social History  . Marital Status: Married    Spouse Name: N/A    Number of Children: N/A  . Years of  Education: N/A   Occupational History  . Not on file.   Social History Main Topics  . Smoking status: Never Smoker   . Smokeless tobacco: Not on file  . Alcohol Use: No  . Drug Use: No  . Sexual Activity: Not on file   Other Topics Concern  . Not on file   Social History Narrative  . No narrative on file     Constitutional: Denies fever, malaise, fatigue, headache or abrupt weight changes.  Respiratory: Denies difficulty breathing, shortness of breath, cough or sputum production.   Cardiovascular: Denies chest pain, chest tightness, palpitations or swelling in the hands or feet.  Gastrointestinal: Pt reports abdominal pain and loose stools. Denies bloating, constipation, or blood in the stool.    No other specific complaints in a complete review of systems (except as listed in HPI above).  Objective:   Physical Exam   BP 110/76  Pulse 77  Temp(Src) 98.4 F (36.9 C) (Oral)  Wt 162 lb (73.483 kg)  SpO2 98%  LMP 10/09/2013 Wt Readings from Last 3 Encounters:  10/30/13 162 lb (73.483 kg)  09/18/13 169 lb 8 oz (76.885 kg)  05/31/13 173 lb 4 oz (78.586 kg)    General:  Appears her stated age, well developed, well nourished in NAD. Cardiovascular: Normal rate and rhythm. S1,S2 noted.  No murmur, rubs or gallops noted. No JVD or BLE edema. No carotid bruits noted. Pulmonary/Chest: Normal effort and positive vesicular breath sounds. No respiratory distress. No wheezes, rales or ronchi noted.  Abdomen: Soft and tender in the epigastric region. Normal bowel sounds, no bruits noted. No distention or masses noted. Liver, spleen and kidneys non palpable.  BMET    Component Value Date/Time   NA 141 10/23/2013 0903   K 3.6* 10/23/2013 0903   CL 104 10/23/2013 0903   CO2 25 10/23/2013 0903   GLUCOSE 113* 10/23/2013 0903   BUN 9 10/23/2013 0903   CREATININE 0.73 10/23/2013 0903   CALCIUM 9.2 10/23/2013 0903   GFRNONAA >90 10/23/2013 0903   GFRAA >90 10/23/2013 0903    Lipid Panel    No results found for this basename: chol, trig, hdl, cholhdl, vldl, ldlcalc    CBC    Component Value Date/Time   WBC 9.4 10/23/2013 0903   RBC 4.94 10/23/2013 0903   HGB 13.2 10/23/2013 0903   HCT 39.9 10/23/2013 0903   PLT 306 10/23/2013 0903   MCV 80.8 10/23/2013 0903   MCH 26.7 10/23/2013 0903   MCHC 33.1 10/23/2013 0903   RDW 14.2 10/23/2013 0903   LYMPHSABS 2.6 10/23/2013 0903   MONOABS 0.5 10/23/2013 0903   EOSABS 0.1 10/23/2013 0903   BASOSABS 0.0 10/23/2013 0903    Hgb A1C No results found for this basename: HGBA1C        Assessment & Plan:   Nausea, abdominal pain, and H pylori infection:  Already on appropriate medicinal therapy Drink plenty of water to avoid dehydration Will refer to GI  RTC as needed or if symptoms persist or worsen

## 2013-11-01 ENCOUNTER — Telehealth: Payer: Self-pay

## 2013-11-01 ENCOUNTER — Other Ambulatory Visit: Payer: Self-pay | Admitting: Internal Medicine

## 2013-11-01 MED ORDER — PROMETHAZINE HCL 12.5 MG PO TABS: 12.5000 mg | ORAL_TABLET | Freq: Three times a day (TID) | ORAL | Status: DC | PRN

## 2013-11-01 NOTE — Telephone Encounter (Signed)
Will try phenergan. Will call into her pharmacy

## 2013-11-01 NOTE — Telephone Encounter (Signed)
lmovm for pt to return call.  

## 2013-11-01 NOTE — Telephone Encounter (Signed)
Pt taking Zofran but not really helping severe nausea; pt kept down toast last night; pt said nausea worse if pt has to cough or sneeze. No fever. Please advise. [facility].pt request cb.

## 2013-11-08 ENCOUNTER — Encounter: Payer: Self-pay | Admitting: Gastroenterology

## 2013-11-08 ENCOUNTER — Ambulatory Visit (INDEPENDENT_AMBULATORY_CARE_PROVIDER_SITE_OTHER): Payer: 59 | Admitting: Gastroenterology

## 2013-11-08 ENCOUNTER — Other Ambulatory Visit: Payer: 59

## 2013-11-08 VITALS — BP 120/64 | HR 72 | Ht 63.0 in | Wt 162.0 lb

## 2013-11-08 DIAGNOSIS — K5289 Other specified noninfective gastroenteritis and colitis: Secondary | ICD-10-CM

## 2013-11-08 MED ORDER — FAMOTIDINE 40 MG PO TABS: 40.0000 mg | ORAL_TABLET | Freq: Every day | ORAL | Status: DC

## 2013-11-08 MED ORDER — HYOSCYAMINE SULFATE ER 0.375 MG PO TBCR: EXTENDED_RELEASE_TABLET | ORAL | Status: DC

## 2013-11-08 NOTE — Progress Notes (Signed)
    _                                                                                                                History of Present Illness: 38 year old white female referred for evaluation of nausea and diarrhea.  Over the past 6 weeks if she's been complaining of moderately severe nausea, upper abdominal pain and diarrhea.  She's had several episodes of vomiting.  Symptoms began rather acutely but then have continued.  Nausea and pain are not particularly related to eating.  She has seen small amounts of blood on the toilet tissue which she attributes to irritation.  He was treated with a ten-day course of Prevacid, tetracycline and metronidazole for H. Pylori .  Symptoms initially improved but have worsened over the past week since she has been off medications.  She's lost about 10 pounds.  She has no antecedent GI complaints.  She denies pyrosis or lower abdominal pain.    Past Medical History  Diagnosis Date  . Sinusitis   . H. pylori infection    Past Surgical History  Procedure Laterality Date  . Tonsillectomy     family history is negative for Colon cancer. Current Outpatient Prescriptions  Medication Sig Dispense Refill  . ondansetron (ZOFRAN ODT) 4 MG disintegrating tablet Take 1 tablet (4 mg total) by mouth every 8 (eight) hours as needed for nausea or vomiting.  20 tablet  0  . Pseudoephedrine HCl (SUDAFED 12 HOUR PO) Take by mouth.      Marland Kitchen FLUoxetine (PROZAC) 20 MG tablet Take 1 tablet (20 mg total) by mouth daily.  30 tablet  3   No current facility-administered medications for this visit.   Allergies as of 11/08/2013 - Review Complete 11/08/2013  Allergen Reaction Noted  . Amoxicillin      reports that she has never smoked. She has never used smokeless tobacco. She reports that she does not drink alcohol or use illicit drugs.     Review of Systems: Pertinent positive and negative review of systems were noted in the above HPI section. All other review  of systems were otherwise negative.  Vital signs were reviewed in today's medical record Physical Exam: General: Well developed , well nourished, no acute distress Skin: anicteric Head: Normocephalic and atraumatic Eyes:  sclerae anicteric, EOMI Ears: Normal auditory acuity Mouth: No deformity or lesions Neck: Supple, no masses or thyromegaly Lungs: Clear throughout to auscultation Heart: Regular rate and rhythm; no murmurs, rubs or bruits Abdomen: Soft, non tender and non distended. No masses, hepatosplenomegaly or hernias noted. Normal Bowel sounds Rectal:deferred Musculoskeletal: Symmetrical with no gross deformities  Skin: No lesions on visible extremities Pulses:  Normal pulses noted Extremities: No clubbing, cyanosis, edema or deformities noted Neurological: Alert oriented x 4, grossly nonfocal Cervical Nodes:  No significant cervical adenopathy Inguinal Nodes: No significant inguinal adenopathy Psychological:  Alert and cooperative. Normal mood and affect  See Assessment and Plan under Problem List

## 2013-11-08 NOTE — Patient Instructions (Signed)
You have been scheduled for an endoscopy with propofol. Please follow written instructions given to you at your visit today. If you use inhalers (even only as needed), please bring them with you on the day of your procedure. Your physician has requested that you go to www.startemmi.com and enter the access code given to you at your visit today. This web site gives a general overview about your procedure. However, you should still follow specific instructions given to you by our office regarding your preparation for the procedure.  Go to the basement for a lab test

## 2013-11-08 NOTE — Assessment & Plan Note (Addendum)
Six-week history of nausea, vomiting and diarrhea with initial improvement after receiving therapy for an H. pylori infection but with  symptoms and weight loss following completion of therapy.  Recurrent H. pylori infection, persistent enteric infection and active peptic ulcer disease should be ruled out.  Diarrhea could be postinfectious as well.  Recommendations #1 stool pathogen panel #2 begin Pepcid 40 mg daily and hyomax when necessary #3 upper endoscopy pending results of #1 #4 begin flora q one daily for 14 days

## 2013-11-11 ENCOUNTER — Other Ambulatory Visit: Payer: 59

## 2013-11-11 DIAGNOSIS — K5289 Other specified noninfective gastroenteritis and colitis: Secondary | ICD-10-CM

## 2013-11-12 LAB — GASTROINTESTINAL PATHOGEN PANEL PCR
C. difficile Tox A/B, PCR: NEGATIVE
CAMPYLOBACTER, PCR: NEGATIVE
Cryptosporidium, PCR: NEGATIVE
E COLI (ETEC) LT/ST, PCR: NEGATIVE
E COLI (STEC) STX1/STX2, PCR: NEGATIVE
E coli 0157, PCR: NEGATIVE
Giardia lamblia, PCR: NEGATIVE
Norovirus, PCR: NEGATIVE
ROTAVIRUS, PCR: NEGATIVE
SHIGELLA, PCR: NEGATIVE
Salmonella, PCR: NEGATIVE

## 2013-11-14 ENCOUNTER — Ambulatory Visit (AMBULATORY_SURGERY_CENTER): Payer: 59 | Admitting: Gastroenterology

## 2013-11-14 ENCOUNTER — Encounter: Payer: Self-pay | Admitting: Gastroenterology

## 2013-11-14 VITALS — BP 113/69 | HR 82 | Temp 97.9°F | Resp 11 | Ht 63.0 in | Wt 162.0 lb

## 2013-11-14 DIAGNOSIS — K2940 Chronic atrophic gastritis without bleeding: Secondary | ICD-10-CM

## 2013-11-14 DIAGNOSIS — R1013 Epigastric pain: Secondary | ICD-10-CM

## 2013-11-14 DIAGNOSIS — K209 Esophagitis, unspecified without bleeding: Secondary | ICD-10-CM

## 2013-11-14 DIAGNOSIS — R110 Nausea: Secondary | ICD-10-CM

## 2013-11-14 MED ORDER — SODIUM CHLORIDE 0.9 % IV SOLN: 500.0000 mL | INTRAVENOUS | Status: DC

## 2013-11-14 MED ORDER — PANTOPRAZOLE SODIUM 40 MG PO TBEC: 40.0000 mg | DELAYED_RELEASE_TABLET | Freq: Every day | ORAL | Status: DC

## 2013-11-14 NOTE — Progress Notes (Signed)
Pt stable to RR 

## 2013-11-14 NOTE — Op Note (Signed)
[place]  [address] [place], [address]   ENDOSCOPY PROCEDURE REPORT  PATIENT: Vanessa Byrd, Vanessa Byrd  MR#: [id number] BIRTHDATE: 04-24-1976 , 74  yrs. old GENDER: Female ENDOSCOPIST: Inda Castle, MD REFERRED BY: PROCEDURE DATE:  11/14/2013 PROCEDURE:  EGD w/ biopsy ASA CLASS:     Class I INDICATIONS:  Nausea. MEDICATIONS: MAC sedation, administered by CRNA, Propofol (Diprivan) 120 mg IV, and Simethicone 0.6cc PO TOPICAL ANESTHETIC:  DESCRIPTION OF PROCEDURE: After the risks benefits and alternatives of the procedure were thoroughly explained, informed consent was obtained.  The LB [id number] endoscope was introduced through the mouth and advanced to the third portion of the duodenum. Without limitations.  The instrument was slowly withdrawn as the mucosa was fully examined.      In the lower third of the esophagus in the area of the GE junction there was diffuse erythema.   In the lower third of the esophagus in the area of the GE junction there was diffuse erythema.   The remainder of the upper endoscopy exam was otherwise normal. Retroflexed views revealed no abnormalities.   biopsies of the gastric antrum were taken to rule out H. pyloric, in view of recent H. pylori infection.  The scope was then withdrawn from the patient and the procedure completed.  COMPLICATIONS: There were no complications. ENDOSCOPIC IMPRESSION: nonerosive esophagitis RECOMMENDATIONS: 1.  begin Protonix 40 mg daily 2.  office visit 3-4 weeks REPEAT EXAM:  eSigned:  Inda Castle, MD 11/14/2013 4:13 PM   CC: Webb Silversmith, MD

## 2013-11-14 NOTE — Progress Notes (Signed)
Called to room to assist during endoscopic procedure.  Patient ID and intended procedure confirmed with present staff. Received instructions for my participation in the procedure from the performing physician.  

## 2013-11-14 NOTE — Patient Instructions (Signed)
YOU HAD AN ENDOSCOPIC PROCEDURE TODAY AT THE [facility]: Refer to the procedure report that was given to you for any specific questions about what was found during the examination.  If the procedure report does not answer your questions, please call your gastroenterologist to clarify.  If you requested that your care partner not be given the details of your procedure findings, then the procedure report has been included in a sealed envelope for you to review at your convenience later.  YOU SHOULD EXPECT: Some feelings of bloating in the abdomen. Passage of more gas than usual.  Walking can help get rid of the air that was put into your GI tract during the procedure and reduce the bloating. If you had a lower endoscopy (such as a colonoscopy or flexible sigmoidoscopy) you may notice spotting of blood in your stool or on the toilet paper. If you underwent a bowel prep for your procedure, then you may not have a normal bowel movement for a few days.  DIET: Your first meal following the procedure should be a light meal and then it is ok to progress to your normal diet.  A half-sandwich or bowl of soup is an example of a good first meal.  Heavy or fried foods are harder to digest and may make you feel nauseous or bloated.  Likewise meals heavy in dairy and vegetables can cause extra gas to form and this can also increase the bloating.  Drink plenty of fluids but you should avoid alcoholic beverages for 24 hours.  ACTIVITY: Your care partner should take you home directly after the procedure.  You should plan to take it easy, moving slowly for the rest of the day.  You can resume normal activity the day after the procedure however you should NOT DRIVE or use heavy machinery for 24 hours (because of the sedation medicines used during the test).    SYMPTOMS TO REPORT IMMEDIATELY: A gastroenterologist can be reached at any hour.  During normal business hours, 8:30 AM to 5:00 PM Monday through Friday,  call [phone].  After hours and on weekends, please call the GI answering service at [phone] who will take a message and have the physician on call contact you.  Following upper endoscopy (EGD)  Vomiting of blood or coffee ground material  New chest pain or pain under the shoulder blades  Painful or persistently difficult swallowing  New shortness of breath  Fever of 100F or higher  Black, tarry-looking stools  FOLLOW UP: If any biopsies were taken you will be contacted by phone or by letter within the next 1-3 weeks.  Call your gastroenterologist if you have not heard about the biopsies in 3 weeks.  Our staff will call the home number listed on your records the next business day following your procedure to check on you and address any questions or concerns that you may have at that time regarding the information given to you following your procedure. This is a courtesy call and so if there is no answer at the home number and we have not heard from you through the emergency physician on call, we will assume that you have returned to your regular daily activities without incident.  SIGNATURES/CONFIDENTIALITY: You and/or your care partner have signed paperwork which will be entered into your electronic medical record.  These signatures attest to the fact that that the information above on your After Visit Summary has been reviewed and is understood.  Full responsibility of   the confidentiality of this discharge information lies with you and/or your care-partner. 

## 2013-11-15 ENCOUNTER — Telehealth: Payer: Self-pay

## 2013-11-15 NOTE — Telephone Encounter (Signed)
No answer, left message

## 2013-11-19 ENCOUNTER — Telehealth: Payer: Self-pay | Admitting: Gastroenterology

## 2013-11-19 NOTE — Telephone Encounter (Signed)
Pt called to ask about H pylori results and to say that she had had some rectal bleeding. Discussed with pt that the H pylori results were not back yet and that she may be having some bleeding from hemorrhoids. Offered pt an OV appt with [facility] but pt states she cannot come due to her work schedule. Pt encouraged to try some Tucs pads and Prep H suppositories and to call back if she got worse or decided she wanted to be seen. Pt verbalized understanding.

## 2013-11-24 ENCOUNTER — Encounter: Payer: Self-pay | Admitting: Gastroenterology

## 2013-12-19 ENCOUNTER — Ambulatory Visit: Payer: 59 | Admitting: Gastroenterology

## 2014-10-06 ENCOUNTER — Other Ambulatory Visit: Payer: Self-pay | Admitting: Obstetrics & Gynecology

## 2014-10-07 LAB — CYTOLOGY - PAP

## 2014-10-27 ENCOUNTER — Ambulatory Visit (INDEPENDENT_AMBULATORY_CARE_PROVIDER_SITE_OTHER): Payer: 59 | Admitting: Internal Medicine

## 2014-10-27 ENCOUNTER — Encounter: Payer: Self-pay | Admitting: Internal Medicine

## 2014-10-27 VITALS — BP 106/74 | HR 89 | Temp 98.9°F | Wt 180.5 lb

## 2014-10-27 DIAGNOSIS — Z91048 Other nonmedicinal substance allergy status: Secondary | ICD-10-CM

## 2014-10-27 DIAGNOSIS — Z9109 Other allergy status, other than to drugs and biological substances: Secondary | ICD-10-CM

## 2014-10-27 NOTE — Progress Notes (Signed)
HPI  Pt presents to the clinic today with headache, facial pressure, nasal congestion, post nasal drip and cough. This has been intermittent over the last 2-3 months. She does not feel bad but reports her symptoms will not go away. The cough is productive of light green mucous, mostly in the morning after a shower. She denies fever, chills or body aches. She has tried Sudafed intermittently with short term relief. She has had people in her office with similar symptoms over the last 3 months (they had a bad mold infection that was supposedly cleared). She has no history of allergies or breathing problems. She does not smoke.  Review of Systems    Past Medical History  Diagnosis Date  . Sinusitis   . H. pylori infection     Family History  Problem Relation Age of Onset  . Colon cancer Neg Hx   . GER disease Mother     History   Social History  . Marital Status: Married    Spouse Name: N/A  . Number of Children: N/A  . Years of Education: N/A   Occupational History  . Not on file.   Social History Main Topics  . Smoking status: Never Smoker   . Smokeless tobacco: Never Used  . Alcohol Use: No  . Drug Use: No  . Sexual Activity: Not on file   Other Topics Concern  . Not on file   Social History Narrative    Allergies  Allergen Reactions  . Amoxicillin     REACTION: swelling. angioedema     Constitutional: Positive headache. Denies fatigue, fever or abrupt weight changes.  HEENT:  Positive facial pressure, nasal congestion and post nasal drip. Denies eye redness, ear pain, ringing in the ears, wax buildup, runny nose or sore throat. Respiratory: Positive cough. Denies difficulty breathing or shortness of breath.  Cardiovascular: Denies chest pain, chest tightness, palpitations or swelling in the hands or feet.   No other specific complaints in a complete review of systems (except as listed in HPI above).  Objective:  BP 106/74 mmHg  Pulse 89  Temp(Src) 98.9 F  (37.2 C) (Oral)  Wt 180 lb 8 oz (81.874 kg)  SpO2 99%   General: Appears herstated age, well developed, well nourished in NAD. HEENT: Head: normal shape and size, no sinus tenderness noted; Eyes: sclera white, no icterus, conjunctiva pink; Ears: Tm's gray and intact, normal light reflex, + serous effusion on the right; Nose: mucosa boggyand moist, septum midline; Throat/Mouth: + PND. Teeth present, mucosa pink and moist, no exudate noted, no lesions or ulcerations noted.  Neck: No adenopathy. Cardiovascular: Normal rate and rhythm. S1,S2 noted.  No murmur, rubs or gallops noted.  Pulmonary/Chest: Normal effort and positive vesicular breath sounds. No respiratory distress. No wheezes, rales or ronchi noted.      Assessment & Plan:   Environmental Allergies:  Zyrtec daily at [facility] x 2 weeks. Flonase 2 sprays each nostril for 3 days and then daily thereafter x 2 weeks. Will consider referral for allergy testing in the near future if symptoms do not resolve.   RTC as needed or if symptoms persist.

## 2014-10-27 NOTE — Patient Instructions (Signed)

## 2014-10-27 NOTE — Progress Notes (Signed)
   Subjective:    Patient ID: EURETHA NAJARRO, female    DOB: 1976/06/29, 39 y.o.   MRN: [id number]  HPI  Ms. Yamamoto is a 39 year old female who presents today with complaints of facial pain and pressure.  Symptoms began in December and have gotten better and then worse.  She also has post nasal drip and congestion.  She has a mild cough, usually after she gets out of the shower.  She taken sudafed with relief, but symptoms return in a few days.  Denies fever.     Review of Systems  Constitutional: Negative for fever, chills and fatigue.  HENT: Positive for congestion, postnasal drip and sinus pressure. Negative for ear pain, sneezing and sore throat.   Respiratory: Positive for cough. Negative for shortness of breath and wheezing.   Cardiovascular: Negative for chest pain, palpitations and leg swelling.  Skin: Negative for color change, pallor, rash and wound.  Neurological: Positive for headaches. Negative for dizziness and light-headedness.   Past Medical History  Diagnosis Date  . Sinusitis   . H. pylori infection    Family History  Problem Relation Age of Onset  . Colon cancer Neg Hx   . GER disease Mother    Current Outpatient Prescriptions on File Prior to Visit  Medication Sig Dispense Refill  . FLUoxetine (PROZAC) 20 MG tablet Take 1 tablet (20 mg total) by mouth daily. 30 tablet 3  . Hyoscyamine Sulfate 0.375 MG TBCR Take one tab twice a day as needed abdominal pain 25 tablet 1  . ondansetron (ZOFRAN ODT) 4 MG disintegrating tablet Take 1 tablet (4 mg total) by mouth every 8 (eight) hours as needed for nausea or vomiting. 20 tablet 0  . pantoprazole (PROTONIX) 40 MG tablet Take 1 tablet (40 mg total) by mouth daily. 30 tablet 3  . Pseudoephedrine HCl (SUDAFED 12 HOUR PO) Take by mouth.     No current facility-administered medications on file prior to visit.       Objective:   Physical Exam  Constitutional: She is oriented to person, place, and time. She appears  well-developed and well-nourished.  HENT:  Head: Normocephalic and atraumatic.  Mouth/Throat: Oropharynx is clear and moist. No oropharyngeal exudate.  Serous effusion on right   Neck: Normal range of motion. Neck supple.  Cardiovascular: Normal rate, regular rhythm and normal heart sounds.   Pulmonary/Chest: Effort normal and breath sounds normal. She has no wheezes.  Musculoskeletal: Normal range of motion.  Lymphadenopathy:    She has no cervical adenopathy.  Neurological: She is alert and oriented to person, place, and time.  Skin: Skin is warm and dry.     BP 106/74 mmHg  Pulse 89  Temp(Src) 98.9 F (37.2 C) (Oral)  Wt 180 lb 8 oz (81.874 kg)  SpO2 99%      Assessment & Plan:  Advised patient to begin taking zyrtec daily as well as flonase.  Can take Sudafed for 3 days.  Follow up with office if symptoms are not better by Thursday for evaluation for antibiotic.

## 2014-10-27 NOTE — Progress Notes (Signed)
Pre visit review using our clinic review tool, if applicable. No additional management support is needed unless otherwise documented below in the visit note. 

## 2015-03-08 ENCOUNTER — Encounter (HOSPITAL_COMMUNITY): Payer: Self-pay | Admitting: Emergency Medicine

## 2015-03-08 ENCOUNTER — Emergency Department (HOSPITAL_COMMUNITY)
Admission: EM | Admit: 2015-03-08 | Discharge: 2015-03-08 | Disposition: A | Discharge status: Home / Self Care | Payer: 59 | Source: Home / Self Care | Attending: Emergency Medicine | Admitting: Emergency Medicine

## 2015-03-08 DIAGNOSIS — M62830 Muscle spasm of back: Secondary | ICD-10-CM | POA: Diagnosis not present

## 2015-03-08 LAB — POCT I-STAT, CHEM 8
BUN: 15 mg/dL (ref 6–20)
CREATININE: 0.6 mg/dL (ref 0.44–1.00)
Calcium, Ion: 1.26 mmol/L — ABNORMAL HIGH (ref 1.12–1.23)
Chloride: 103 mmol/L (ref 101–111)
Glucose, Bld: 93 mg/dL (ref 65–99)
HCT: 41 % (ref 36.0–46.0)
Hemoglobin: 13.9 g/dL (ref 12.0–15.0)
POTASSIUM: 3.8 mmol/L (ref 3.5–5.1)
Sodium: 140 mmol/L (ref 135–145)
TCO2: 23 mmol/L (ref 0–100)

## 2015-03-08 MED ORDER — CYCLOBENZAPRINE HCL 10 MG PO TABS: 10.0000 mg | ORAL_TABLET | Freq: Three times a day (TID) | ORAL | Status: DC | PRN

## 2015-03-08 MED ORDER — MELOXICAM 15 MG PO TABS: 15.0000 mg | ORAL_TABLET | Freq: Every day | ORAL | Status: DC

## 2015-03-08 NOTE — Discharge Instructions (Signed)
You are having muscle spasms in your back. Continue the icy hot, massage, and heat. Start meloxicam daily for the next few days. Once your symptoms start getting better, you can use it as needed. Take Flexeril every 8 hours as needed for muscle spasms. This medicine will make you drowsy and loopy. Once your spasms resolve, please think about starting a fitness program such as yoga or Pilates to help strengthen the back muscles. Follow-up as needed.

## 2015-03-08 NOTE — ED Provider Notes (Signed)
CSN: [id number]     Arrival date & time 03/08/15  1314 History   First MD Initiated Contact with Patient 03/08/15 1423     Chief Complaint  Patient presents with  . Spasms   (Consider location/radiation/quality/duration/timing/severity/associated sxs/prior Treatment) HPI  She is a 39 year old woman here for evaluation of back spasms. She states this started about 5 days ago. They're located in her right back. She states that yesterday was the worst day with constant pain. Today is better, but has had multiple episodes of the muscle seizing up on her. She is unable to sit up without rolling to her side first. No pain radiating down her legs. No urinary symptoms. She denies any injury or trauma. No new activities. She states she has had spasms in the past, but they only lasted 1-2 days. She has tried icy hot patches, icy hot roller, massage, ice, and Aleve without improvement.  Past Medical History  Diagnosis Date  . Sinusitis   . H. pylori infection    Past Surgical History  Procedure Laterality Date  . Tonsillectomy     Family History  Problem Relation Age of Onset  . Colon cancer Neg Hx   . GER disease Mother    History  Substance Use Topics  . Smoking status: Never Smoker   . Smokeless tobacco: Never Used  . Alcohol Use: No   OB History    No data available     Review of Systems As in history of present illness Allergies  Amoxicillin  Home Medications   Prior to Admission medications   Medication Sig Start Date End Date Taking? Authorizing Provider  cyclobenzaprine (FLEXERIL) 10 MG tablet Take 1 tablet (10 mg total) by mouth 3 (three) times daily as needed for muscle spasms. 03/08/15   Melony Overly, MD  FLUoxetine (PROZAC) 20 MG tablet Take 1 tablet (20 mg total) by mouth daily. 05/31/13   Jearld Fenton, NP  Hyoscyamine Sulfate 0.375 MG TBCR Take one tab twice a day as needed abdominal pain 11/08/13   Inda Castle, MD  meloxicam (MOBIC) 15 MG tablet Take 1 tablet (15  mg total) by mouth daily. 03/08/15   Melony Overly, MD  ondansetron (ZOFRAN ODT) 4 MG disintegrating tablet Take 1 tablet (4 mg total) by mouth every 8 (eight) hours as needed for nausea or vomiting. 10/23/13   Fransico Meadow, PA-C  pantoprazole (PROTONIX) 40 MG tablet Take 1 tablet (40 mg total) by mouth daily. 11/14/13   Inda Castle, MD  Pseudoephedrine HCl (SUDAFED 12 HOUR PO) Take by mouth.    Historical Provider, MD   BP 124/73 mmHg  Pulse 64  Temp(Src) 98.8 F (37.1 C) (Oral)  Resp 16  SpO2 100%  LMP 03/07/2015 Physical Exam  Constitutional: She is oriented to person, place, and time. She appears well-developed and well-nourished. She appears distressed (sitting stiffly).  Cardiovascular: Normal rate.   Pulmonary/Chest: Effort normal.  Musculoskeletal:  Back: No vertebral tenderness or step-offs. She has tenderness and spasm in the right lumbar and thoracic paraspinous musculature.  Neurological: She is alert and oriented to person, place, and time.    ED Course  Procedures (including critical care time) Labs Review Labs Reviewed  POCT I-STAT, CHEM 8 - Abnormal; Notable for the following:    Calcium, Ion 1.26 (*)    All other components within normal limits    Imaging Review No results found.   MDM   1. Back spasm  Continue symptomatic treatment at home. Will add meloxicam and Flexeril. Follow-up as needed.    Melony Overly, MD 03/08/15 [id number]

## 2015-03-08 NOTE — ED Notes (Signed)
C/o back spasms for 5 days now States she has spasms on right side of back Heat, ice and aleve used as tx

## 2015-08-14 ENCOUNTER — Telehealth: Payer: 59 | Admitting: Nurse Practitioner

## 2015-08-14 DIAGNOSIS — J0100 Acute maxillary sinusitis, unspecified: Secondary | ICD-10-CM | POA: Diagnosis not present

## 2015-08-14 MED ORDER — AZITHROMYCIN 250 MG PO TABS: ORAL_TABLET | ORAL | Status: DC

## 2015-08-14 NOTE — Progress Notes (Signed)

## 2015-10-20 ENCOUNTER — Telehealth: Payer: 59 | Admitting: Family

## 2015-10-20 DIAGNOSIS — B9689 Other specified bacterial agents as the cause of diseases classified elsewhere: Secondary | ICD-10-CM

## 2015-10-20 DIAGNOSIS — J019 Acute sinusitis, unspecified: Secondary | ICD-10-CM

## 2015-10-20 MED ORDER — AZITHROMYCIN 250 MG PO TABS: ORAL_TABLET | ORAL | Status: DC

## 2015-10-20 MED ORDER — FLUTICASONE PROPIONATE 50 MCG/ACT NA SUSP: 2.0000 | Freq: Every day | NASAL | Status: DC

## 2015-10-20 MED FILL — FLUTICASONE PROP 50 MCG SPR: 50 | 30 days supply | Qty: 16 | Fill #0

## 2015-10-20 MED FILL — AZITHROMYCIN 250 MG TABLET: 250 | 5 days supply | Qty: 6 | Fill #0

## 2015-10-20 NOTE — Progress Notes (Signed)
We are sorry that you are not feeling well.  Here is how we plan to help!  Based on what you have shared with me it looks like you have sinusitis.  Sinusitis is inflammation and infection in the sinus cavities of the head.  Based on your presentation I believe you most likely have Acute Bacterial Sinusitis.  This is an infection caused by bacteria and is treated with antibiotics. I have prescribed Zpak. Take 2 tabs today and 1 tab daily x 4 more days. You may use an oral decongestant such as Mucinex D or if you have glaucoma or high blood pressure use plain Mucinex. Saline nasal spray help and can safely be used as often as needed for congestion.  If you develop worsening sinus pain, fever or notice severe headache and vision changes, or if symptoms are not better after completion of antibiotic, please schedule an appointment with a health care provider.    Flonase 2 sprays in each nostril once a day has been sent as well.   Sinus infections are not as easily transmitted as other respiratory infection, however we still recommend that you avoid close contact with loved ones, especially the very young and elderly.  Remember to wash your hands thoroughly throughout the day as this is the number one way to prevent the spread of infection!  Home Care:  Only take medications as instructed by your medical team.  Complete the entire course of an antibiotic.  Do not take these medications with alcohol.  A steam or ultrasonic humidifier can help congestion.  You can place a towel over your head and breathe in the steam from hot water coming from a faucet.  Avoid close contacts especially the very young and the elderly.  Cover your mouth when you cough or sneeze.  Always remember to wash your hands.  Get Help Right Away If:  You develop worsening fever or sinus pain.  You develop a severe head ache or visual changes.  Your symptoms persist after you have completed your treatment plan.  Make sure  you  Understand these instructions.  Will watch your condition.  Will get help right away if you are not doing well or get worse.  Your e-visit answers were reviewed by a board certified advanced clinical practitioner to complete your personal care plan.  Depending on the condition, your plan could have included both over the counter or prescription medications.  If there is a problem please reply  once you have received a response from your provider.  Your safety is important to [place].  If you have drug allergies check your prescription carefully.    You can use MyChart to ask questions about today's visit, request a non-urgent call back, or ask for a work or school excuse for 24 hours related to this e-Visit. If it has been greater than 24 hours you will need to follow up with your provider, or enter a new e-Visit to address those concerns.  You will get an e-mail in the next two days asking about your experience.  I hope that your e-visit has been valuable and will speed your recovery. Thank you for using e-visits.

## 2016-06-13 DIAGNOSIS — H5213 Myopia, bilateral: Secondary | ICD-10-CM | POA: Diagnosis not present

## 2016-06-13 DIAGNOSIS — H52223 Regular astigmatism, bilateral: Secondary | ICD-10-CM | POA: Diagnosis not present

## 2016-08-05 DIAGNOSIS — R5383 Other fatigue: Secondary | ICD-10-CM | POA: Diagnosis not present

## 2016-08-05 DIAGNOSIS — L659 Nonscarring hair loss, unspecified: Secondary | ICD-10-CM | POA: Diagnosis not present

## 2016-08-05 DIAGNOSIS — N926 Irregular menstruation, unspecified: Secondary | ICD-10-CM | POA: Diagnosis not present

## 2016-09-13 ENCOUNTER — Other Ambulatory Visit: Payer: Self-pay | Admitting: Obstetrics & Gynecology

## 2016-09-13 DIAGNOSIS — Z3202 Encounter for pregnancy test, result negative: Secondary | ICD-10-CM | POA: Diagnosis not present

## 2016-09-13 DIAGNOSIS — N926 Irregular menstruation, unspecified: Secondary | ICD-10-CM | POA: Diagnosis not present

## 2016-09-13 DIAGNOSIS — Z1231 Encounter for screening mammogram for malignant neoplasm of breast: Secondary | ICD-10-CM | POA: Diagnosis not present

## 2016-09-13 DIAGNOSIS — Z6832 Body mass index (BMI) 32.0-32.9, adult: Secondary | ICD-10-CM | POA: Diagnosis not present

## 2016-09-13 DIAGNOSIS — N643 Galactorrhea not associated with childbirth: Secondary | ICD-10-CM | POA: Diagnosis not present

## 2016-09-13 DIAGNOSIS — Z01419 Encounter for gynecological examination (general) (routine) without abnormal findings: Secondary | ICD-10-CM | POA: Diagnosis not present

## 2016-09-13 DIAGNOSIS — Z124 Encounter for screening for malignant neoplasm of cervix: Secondary | ICD-10-CM | POA: Diagnosis not present

## 2016-09-13 LAB — HM PAP SMEAR: HM Pap smear: NORMAL

## 2016-09-13 LAB — HM MAMMOGRAPHY: HM MAMMO: NORMAL (ref 0–4)

## 2016-09-15 LAB — CYTOLOGY - PAP

## 2016-09-17 ENCOUNTER — Encounter: Payer: Self-pay | Admitting: Internal Medicine

## 2016-09-21 MED FILL — MEDROXYPROGESTERONE 10 MG T: 10 | 7 days supply | Qty: 7 | Fill #0

## 2016-09-30 ENCOUNTER — Telehealth: Payer: 59 | Admitting: Nurse Practitioner

## 2016-09-30 DIAGNOSIS — R059 Cough, unspecified: Secondary | ICD-10-CM

## 2016-09-30 DIAGNOSIS — R05 Cough: Secondary | ICD-10-CM | POA: Diagnosis not present

## 2016-09-30 NOTE — Progress Notes (Signed)

## 2016-11-22 ENCOUNTER — Ambulatory Visit (INDEPENDENT_AMBULATORY_CARE_PROVIDER_SITE_OTHER): Payer: 59 | Admitting: Internal Medicine

## 2016-11-22 ENCOUNTER — Encounter: Payer: Self-pay | Admitting: Internal Medicine

## 2016-11-22 VITALS — BP 118/76 | HR 63 | Temp 98.1°F | Wt 182.8 lb

## 2016-11-22 DIAGNOSIS — R51 Headache: Secondary | ICD-10-CM | POA: Diagnosis not present

## 2016-11-22 DIAGNOSIS — R519 Headache, unspecified: Secondary | ICD-10-CM

## 2016-11-22 MED ORDER — BUTALBITAL-APAP-CAFFEINE 50-325-40 MG PO TABS: 1.0000 | ORAL_TABLET | Freq: Four times a day (QID) | ORAL | 0 refills | Status: DC | PRN

## 2016-11-22 MED FILL — BUTALBITAL/APAP/CAFFEINE TB: 50-325-40 | 3 days supply | Qty: 20 | Fill #0

## 2016-11-22 NOTE — Progress Notes (Signed)
Subjective:    Patient ID: Vanessa Byrd, female    DOB: 11-23-1975, 41 y.o.   MRN: [id number]  HPI  Pt presents to the clinic today with c/o a headache. She reports this started 10 days ago. It started behind her left eye, but the pain is now generalized over her whole head. She describes the pain as achy and throbbing. She does note some stiffness in her neck. She reports associated nausea but no vomiting. She denies dizziness, sensitivity to light or sound. She has tried Ibuprofen, and Excedrin with minimal relief. She has no history of migraines. She denies head trauma.   Review of Systems      Past Medical History:  Diagnosis Date  . H. pylori infection   . Sinusitis     Current Outpatient Prescriptions  Medication Sig Dispense Refill  . fluticasone (FLONASE) 50 MCG/ACT nasal spray Place 2 sprays into both nostrils daily. (Patient not taking: Reported on 11/22/2016) 16 g 0   No current facility-administered medications for this visit.     Allergies  Allergen Reactions  . Amoxicillin     REACTION: swelling. angioedema    Family History  Problem Relation Age of Onset  . Colon cancer Neg Hx   . GER disease Mother     Social History   Social History  . Marital status: Married    Spouse name: N/A  . Number of children: N/A  . Years of education: N/A   Occupational History  . Not on file.   Social History Main Topics  . Smoking status: Never Smoker  . Smokeless tobacco: Never Used  . Alcohol use No  . Drug use: No  . Sexual activity: Not on file   Other Topics Concern  . Not on file   Social History Narrative  . No narrative on file     Constitutional: Pt reports headache. Denies fever, malaise, fatigue, or abrupt weight changes.  HEENT: Denies eye pain, eye redness, ear pain, ringing in the ears, wax buildup, runny nose, nasal congestion, bloody nose, or sore throat. Gastrointestinal: Pt reports nausea. Denies abdominal pain, bloating,  constipation, diarrhea or blood in the stool.  Neurological: Denies dizziness, difficulty with memory, difficulty with speech or problems with balance and coordination.   No other specific complaints in a complete review of systems (except as listed in HPI above).  Objective:   Physical Exam BP 118/76   Pulse 63   Temp 98.1 F (36.7 C) (Oral)   Wt 182 lb 12 oz (82.9 kg)   LMP 11/06/2016   SpO2 98%   BMI 32.37 kg/m  Wt Readings from Last 3 Encounters:  11/22/16 182 lb 12 oz (82.9 kg)  10/27/14 180 lb 8 oz (81.9 kg)  11/14/13 162 lb (73.5 kg)    General: Appears her stated age, in NAD. HEENT: Head: normal shape and size, no sinus tenderness noted; Eyes: sclera white, no icterus, conjunctiva pink, PERRLA and EOMs intact, no nystagmus;  Musculoskeletal: Normal flexion, extension and rotation of the cervical spine. No bony tenderness noted over the spine. Neurological: Alert and oriented. Coordination normal.    BMET    Component Value Date/Time   NA 140 03/08/2015 1449   K 3.8 03/08/2015 1449   CL 103 03/08/2015 1449   CO2 25 10/23/2013 0903   GLUCOSE 93 03/08/2015 1449   BUN 15 03/08/2015 1449   CREATININE 0.60 03/08/2015 1449   CALCIUM 9.2 10/23/2013 0903   GFRNONAA >90 10/23/2013 4098  GFRAA >90 10/23/2013 0903    Lipid Panel  No results found for: CHOL, TRIG, HDL, CHOLHDL, VLDL, LDLCALC  CBC    Component Value Date/Time   WBC 9.4 10/23/2013 0903   RBC 4.94 10/23/2013 0903   HGB 13.9 03/08/2015 1449   HCT 41.0 03/08/2015 1449   PLT 306 10/23/2013 0903   MCV 80.8 10/23/2013 0903   MCH 26.7 10/23/2013 0903   MCHC 33.1 10/23/2013 0903   RDW 14.2 10/23/2013 0903   LYMPHSABS 2.6 10/23/2013 0903   MONOABS 0.5 10/23/2013 0903   EOSABS 0.1 10/23/2013 0903   BASOSABS 0.0 10/23/2013 0903    Hgb A1C No results found for: HGBA1C           Assessment & Plan:   Headache:  She drove today and plans to go to work after this, so will not give Toradol  injection 80 mg Depo IM today RX for Fioricet provided today Increase fluid intake and make sure you are well hydrated  Let me know if pain persists, if worsens, go to ER Webb Silversmith, NP

## 2016-11-22 NOTE — Patient Instructions (Signed)
General Headache Without Cause A headache is pain or discomfort felt around the head or neck area. There are many causes and types of headaches. In some cases, the cause may not be found. Follow these instructions at home: Managing pain   Take over-the-counter and prescription medicines only as told by your doctor.  Lie down in a dark, quiet room when you have a headache.  If directed, apply ice to the head and neck area:  Put ice in a plastic bag.  Place a towel between your skin and the bag.  Leave the ice on for 20 minutes, 2-3 times per day.  Use a heating pad or hot shower to apply heat to the head and neck area as told by your doctor.  Keep lights dim if bright lights bother you or make your headaches worse. Eating and drinking   Eat meals on a regular schedule.  Lessen how much alcohol you drink.  Lessen how much caffeine you drink, or stop drinking caffeine. General instructions   Keep all follow-up visits as told by your doctor. This is important.  Keep a journal to find out if certain things bring on headaches. For example, write down:  What you eat and drink.  How much sleep you get.  Any change to your diet or medicines.  Relax by getting a massage or doing other relaxing activities.  Lessen stress.  Sit up straight. Do not tighten (tense) your muscles.  Do not use tobacco products. This includes cigarettes, chewing tobacco, or e-cigarettes. If you need help quitting, ask your doctor.  Exercise regularly as told by your doctor.  Get enough sleep. This often means 7-9 hours of sleep. Contact a doctor if:  Your symptoms are not helped by medicine.  You have a headache that feels different than the other headaches.  You feel sick to your stomach (nauseous) or you throw up (vomit).  You have a fever. Get help right away if:  Your headache becomes really bad.  You keep throwing up.  You have a stiff neck.  You have trouble seeing.  You have  trouble speaking.  You have pain in the eye or ear.  Your muscles are weak or you lose muscle control.  You lose your balance or have trouble walking.  You feel like you will pass out (faint) or you pass out.  You have confusion. This information is not intended to replace advice given to you by your health care provider. Make sure you discuss any questions you have with your health care provider. Document Released: 05/24/2008 Document Revised: 01/21/2016 Document Reviewed: 12/08/2014 Elsevier Interactive Patient Education  2017 [employer].

## 2016-11-23 MED ORDER — METHYLPREDNISOLONE ACETATE 80 MG/ML IJ SUSP
80.0000 mg | Freq: Once | INTRAMUSCULAR | Status: AC
  Administered 2016-11-22: 80 mg via INTRAMUSCULAR

## 2016-11-23 NOTE — Addendum Note (Signed)
Addended by: Lurlean Nanny on: 11/23/2016 11:42 AM   Modules accepted: Orders

## 2016-12-28 ENCOUNTER — Telehealth: Payer: 59 | Admitting: Nurse Practitioner

## 2016-12-28 DIAGNOSIS — J0100 Acute maxillary sinusitis, unspecified: Secondary | ICD-10-CM | POA: Diagnosis not present

## 2016-12-28 MED ORDER — AZITHROMYCIN 250 MG PO TABS: ORAL_TABLET | ORAL | 0 refills | Status: DC

## 2016-12-28 MED FILL — AZITHROMYCIN 250 MG TABLET: 250 | 5 days supply | Qty: 6 | Fill #0

## 2016-12-28 NOTE — Progress Notes (Signed)

## 2017-01-02 ENCOUNTER — Telehealth: Payer: Self-pay | Admitting: *Deleted

## 2017-01-02 NOTE — Telephone Encounter (Signed)
Pt left vm at triage and states she was seen in 10/2016, and advised to update with status of HA. Her HA has not improved and she is now experiencing nausea and dizziness, which she was not having before. pls requesting a cb with advice how to proceed

## 2017-01-02 NOTE — Telephone Encounter (Signed)
Its been 5 weeks. She needs to make a follow up appt to discuss

## 2017-01-02 NOTE — Telephone Encounter (Signed)
Pt has appt tomorrow

## 2017-01-03 ENCOUNTER — Encounter: Payer: Self-pay | Admitting: Internal Medicine

## 2017-01-03 ENCOUNTER — Ambulatory Visit (INDEPENDENT_AMBULATORY_CARE_PROVIDER_SITE_OTHER): Payer: 59 | Admitting: Internal Medicine

## 2017-01-03 VITALS — BP 118/80 | HR 66 | Temp 97.9°F | Wt 186.0 lb

## 2017-01-03 DIAGNOSIS — R519 Headache, unspecified: Secondary | ICD-10-CM

## 2017-01-03 DIAGNOSIS — R51 Headache: Secondary | ICD-10-CM

## 2017-01-03 MED ORDER — ACETAMINOPHEN-CODEINE #3 300-30 MG PO TABS: 1.0000 | ORAL_TABLET | Freq: Three times a day (TID) | ORAL | 0 refills | Status: DC | PRN

## 2017-01-03 MED FILL — ACETAMINOPHEN/COD #3 TABLET: 300-30 | 5 days supply | Qty: 15 | Fill #0

## 2017-01-03 NOTE — Progress Notes (Signed)
Subjective:    Patient ID: Vanessa Byrd, female    DOB: September 10, 1975, 41 y.o.   MRN: [id number]  HPI  Pt presents to the clinic today to follow up headaches. She reports she has been having daily headaches for the last 6-8 weeks. She has not been able to identify any patterns with them. They occur in different places, during different times of the day and vary in intensity. She has intermittent dizziness but denies visual changes, nasal congestion, runny nose, ear pressure. She is sleeping 6-7 hours at night but reports she has constantly been tossing and turing. She does not feel rested when she wakes up. She denies any increase in stress. She denies neck pain or head trauma. She was seen for the same 10/2016, prescribed Fioricet but reports she has not gotten any relief with. She reports she also intermittently takes Ibuprofen and Tylenol which dulls the pain but her headaches never completely go away.    Review of Systems      Past Medical History:  Diagnosis Date  . H. pylori infection   . Sinusitis     Current Outpatient Prescriptions  Medication Sig Dispense Refill  . azithromycin (ZITHROMAX Z-PAK) 250 MG tablet As directed 6 tablet 0  . butalbital-acetaminophen-caffeine (FIORICET, ESGIC) 50-325-40 MG tablet Take 1-2 tablets by mouth every 6 (six) hours as needed for headache. 20 tablet 0  . fluticasone (FLONASE) 50 MCG/ACT nasal spray Place 2 sprays into both nostrils daily. (Patient not taking: Reported on 11/22/2016) 16 g 0   No current facility-administered medications for this visit.     Allergies  Allergen Reactions  . Amoxicillin     REACTION: swelling. angioedema    Family History  Problem Relation Age of Onset  . GER disease Mother   . Colon cancer Neg Hx     Social History   Social History  . Marital status: Married    Spouse name: N/A  . Number of children: N/A  . Years of education: N/A   Occupational History  . Not on file.   Social History  Main Topics  . Smoking status: Never Smoker  . Smokeless tobacco: Never Used  . Alcohol use No  . Drug use: No  . Sexual activity: Not on file   Other Topics Concern  . Not on file   Social History Narrative  . No narrative on file     Constitutional: Pt reports headaches. Denies fever, malaise, fatigue, or abrupt weight changes.  HEENT: Denies eye pain, eye redness, ear pain, ringing in the ears, wax buildup, runny nose, nasal congestion, bloody nose, or sore throat. Neurological: Pt reports dizziness. Denies difficulty with memory, difficulty with speech or problems with balance and coordination.    No other specific complaints in a complete review of systems (except as listed in HPI above).  Objective:   Physical Exam  BP 118/80   Pulse 66   Temp 97.9 F (36.6 C) (Oral)   Wt 186 lb (84.4 kg)   SpO2 98%   BMI 32.95 kg/m  Wt Readings from Last 3 Encounters:  01/03/17 186 lb (84.4 kg)  11/22/16 182 lb 12 oz (82.9 kg)  10/27/14 180 lb 8 oz (81.9 kg)    General: Appears her stated age, obese in NAD. HEENT: Head: normal shape and size; Eyes: PERRLA and EOMs intact, no nystagmus noted; Ears: Tm's gray and intact, normal light reflex, + serous effusion bilaterally;  Musculoskeletal: Normal flexion, extension, rotation and lateral bending  of the cervical spine. No bony tenderness noted over the cervical spine. Neurological: Alert and oriented. Coordination normal.     BMET    Component Value Date/Time   NA 140 03/08/2015 1449   K 3.8 03/08/2015 1449   CL 103 03/08/2015 1449   CO2 25 10/23/2013 0903   GLUCOSE 93 03/08/2015 1449   BUN 15 03/08/2015 1449   CREATININE 0.60 03/08/2015 1449   CALCIUM 9.2 10/23/2013 0903   GFRNONAA >90 10/23/2013 0903   GFRAA >90 10/23/2013 0903    Lipid Panel  No results found for: CHOL, TRIG, HDL, CHOLHDL, VLDL, LDLCALC  CBC    Component Value Date/Time   WBC 9.4 10/23/2013 0903   RBC 4.94 10/23/2013 0903   HGB 13.9 03/08/2015  1449   HCT 41.0 03/08/2015 1449   PLT 306 10/23/2013 0903   MCV 80.8 10/23/2013 0903   MCH 26.7 10/23/2013 0903   MCHC 33.1 10/23/2013 0903   RDW 14.2 10/23/2013 0903   LYMPHSABS 2.6 10/23/2013 0903   MONOABS 0.5 10/23/2013 0903   EOSABS 0.1 10/23/2013 0903   BASOSABS 0.0 10/23/2013 0903    Hgb A1C No results found for: HGBA1C          Assessment & Plan:   Reoccurring Headaches:  Discussed preventative treatment, but she wants to hold off at this time Advised her to start Flonase that is on her prescription list She will make an appt to get her eyes evaluated 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

## 2017-01-03 NOTE — Patient Instructions (Signed)
General Headache Without Cause A headache is pain or discomfort felt around the head or neck area. There are many causes and types of headaches. In some cases, the cause may not be found. Follow these instructions at home: Managing pain   Take over-the-counter and prescription medicines only as told by your doctor.  Lie down in a dark, quiet room when you have a headache.  If directed, apply ice to the head and neck area:  Put ice in a plastic bag.  Place a towel between your skin and the bag.  Leave the ice on for 20 minutes, 2-3 times per day.  Use a heating pad or hot shower to apply heat to the head and neck area as told by your doctor.  Keep lights dim if bright lights bother you or make your headaches worse. Eating and drinking   Eat meals on a regular schedule.  Lessen how much alcohol you drink.  Lessen how much caffeine you drink, or stop drinking caffeine. General instructions   Keep all follow-up visits as told by your doctor. This is important.  Keep a journal to find out if certain things bring on headaches. For example, write down:  What you eat and drink.  How much sleep you get.  Any change to your diet or medicines.  Relax by getting a massage or doing other relaxing activities.  Lessen stress.  Sit up straight. Do not tighten (tense) your muscles.  Do not use tobacco products. This includes cigarettes, chewing tobacco, or e-cigarettes. If you need help quitting, ask your doctor.  Exercise regularly as told by your doctor.  Get enough sleep. This often means 7-9 hours of sleep. Contact a doctor if:  Your symptoms are not helped by medicine.  You have a headache that feels different than the other headaches.  You feel sick to your stomach (nauseous) or you throw up (vomit).  You have a fever. Get help right away if:  Your headache becomes really bad.  You keep throwing up.  You have a stiff neck.  You have trouble seeing.  You have  trouble speaking.  You have pain in the eye or ear.  Your muscles are weak or you lose muscle control.  You lose your balance or have trouble walking.  You feel like you will pass out (faint) or you pass out.  You have confusion. This information is not intended to replace advice given to you by your health care provider. Make sure you discuss any questions you have with your health care provider. Document Released: 05/24/2008 Document Revised: 01/21/2016 Document Reviewed: 12/08/2014 Elsevier Interactive Patient Education  2017 [employer].

## 2017-01-20 MED FILL — HYDROCODON-APAP 7.5-325: 7.5-325 | 3 days supply | Qty: 20 | Fill #0

## 2017-09-17 ENCOUNTER — Encounter (HOSPITAL_COMMUNITY): Payer: Self-pay | Admitting: Family Medicine

## 2017-09-17 ENCOUNTER — Ambulatory Visit (HOSPITAL_COMMUNITY)
Admission: EM | Admit: 2017-09-17 | Discharge: 2017-09-17 | Disposition: A | Discharge status: Home / Self Care | Payer: No Typology Code available for payment source | Attending: Internal Medicine | Admitting: Internal Medicine

## 2017-09-17 DIAGNOSIS — H73893 Other specified disorders of tympanic membrane, bilateral: Secondary | ICD-10-CM

## 2017-09-17 DIAGNOSIS — R519 Headache, unspecified: Secondary | ICD-10-CM

## 2017-09-17 DIAGNOSIS — R51 Headache: Secondary | ICD-10-CM

## 2017-09-17 DIAGNOSIS — H6593 Unspecified nonsuppurative otitis media, bilateral: Secondary | ICD-10-CM

## 2017-09-17 MED ORDER — METOCLOPRAMIDE HCL 5 MG/ML IJ SOLN
INTRAMUSCULAR | Status: AC
  Filled 2017-09-17: qty 2

## 2017-09-17 MED ORDER — FLUTICASONE PROPIONATE 50 MCG/ACT NA SUSP: 2.0000 | Freq: Every day | NASAL | 0 refills | Status: AC

## 2017-09-17 MED ORDER — METOCLOPRAMIDE HCL 5 MG/ML IJ SOLN
5.0000 mg | Freq: Once | INTRAMUSCULAR | Status: AC
  Administered 2017-09-17: 5 mg via INTRAMUSCULAR

## 2017-09-17 MED ORDER — DEXAMETHASONE SODIUM PHOSPHATE 10 MG/ML IJ SOLN
INTRAMUSCULAR | Status: AC
  Filled 2017-09-17: qty 1

## 2017-09-17 MED ORDER — KETOROLAC TROMETHAMINE 60 MG/2ML IM SOLN
60.0000 mg | Freq: Once | INTRAMUSCULAR | Status: AC
  Administered 2017-09-17: 60 mg via INTRAMUSCULAR

## 2017-09-17 MED ORDER — KETOROLAC TROMETHAMINE 60 MG/2ML IM SOLN
INTRAMUSCULAR | Status: AC
  Filled 2017-09-17: qty 2

## 2017-09-17 MED ORDER — DEXAMETHASONE SODIUM PHOSPHATE 10 MG/ML IJ SOLN
10.0000 mg | Freq: Once | INTRAMUSCULAR | Status: AC
  Administered 2017-09-17: 10 mg via INTRAMUSCULAR

## 2017-09-17 MED ORDER — MELOXICAM 7.5 MG PO TABS: 7.5000 mg | ORAL_TABLET | Freq: Every day | ORAL | 0 refills | Status: DC

## 2017-09-17 NOTE — Discharge Instructions (Signed)
Toradol, Decadron, Reglan injection in office today.  Headache, eye pressure could be due to sinus pressure. Start Flonase as directed.  You can take Mobic for any continued pain.  Monitor for any worsening of symptoms, confusion, worsening headache, blurry vision, dizziness,  one-sided weakness, worse headache in your life, go to the emergency department for further evaluation.

## 2017-09-17 NOTE — ED Provider Notes (Signed)
[place]    CSN: [id number] Arrival date & time: 09/17/17  1646     History   Chief Complaint Chief Complaint  Patient presents with  . Headache    HPI Vanessa Byrd is a 42 y.o. female.   42 year old female comes in for 2-day history of left-sided headache.  States symptoms started yesterday, that traveled down her face.  She has had left-sided eye pressure.  States continued pressure of the left face today.  Denies URI symptoms such as cough, congestion, sore throat.  Does experience some left  ear fullness without changes in hearing.  She states she has some dizziness with nausea when the headache first started, but has since resolved.  She took Aleve, Excedrin with some relief.  She denies any vision changes, photophobia.  Denies worse headache of her life.      Past Medical History:  Diagnosis Date  . H. pylori infection   . Sinusitis     Patient Active Problem List   Diagnosis Date Noted  . Anxiety and depression 05/31/2013  . ANEMIA-IRON DEFICIENCY 11/24/2009  . ALLERGIC RHINITIS 11/24/2009    Past Surgical History:  Procedure Laterality Date  . TONSILLECTOMY      OB History    No data available       Home Medications    Prior to Admission medications   Medication Sig Start Date End Date Taking? Authorizing Provider  acetaminophen-codeine (TYLENOL #3) 300-30 MG tablet Take 1 tablet by mouth every 8 (eight) hours as needed for moderate pain. 01/03/17   Jearld Fenton, NP  fluticasone (FLONASE) 50 MCG/ACT nasal spray Place 2 sprays into both nostrils daily. 09/17/17   Tasia Catchings, Kenyada Dosch V, PA-C  meloxicam (MOBIC) 7.5 MG tablet Take 1 tablet (7.5 mg total) by mouth daily. 09/17/17   Ok Edwards, PA-C    Family History Family History  Problem Relation Age of Onset  . GER disease Mother   . Colon cancer Neg Hx     Social History Social History   Tobacco Use  . Smoking status: Never Smoker  . Smokeless tobacco: Never Used  Substance Use  Topics  . Alcohol use: No  . Drug use: No     Allergies   Amoxicillin   Review of Systems Review of Systems  Reason unable to perform ROS: See HPI as above.     Physical Exam Triage Vital Signs ED Triage Vitals  Enc Vitals Group     BP 09/17/17 1759 130/74     Pulse Rate 09/17/17 1759 64     Resp 09/17/17 1759 16     Temp 09/17/17 1759 98.4 F (36.9 C)     Temp Source 09/17/17 1759 Oral     SpO2 09/17/17 1759 99 %     Weight --      Height --      Head Circumference --      Peak Flow --      Pain Score 09/17/17 1809 4     Pain Loc --      Pain Edu? --      Excl. in [place]? --    No data found.  Updated Vital Signs BP 130/74 (BP Location: Left Arm)   Pulse 64   Temp 98.4 F (36.9 C) (Oral)   Resp 16   LMP 09/08/2017   SpO2 99%   Physical Exam  Constitutional: She is oriented to person, place, and time. She appears well-developed and well-nourished. No  distress.  HENT:  Head: Normocephalic and atraumatic.  Right Ear: External ear and ear canal normal. Tympanic membrane is not erythematous and not bulging. A middle ear effusion is present.  Left Ear: External ear and ear canal normal. Tympanic membrane is not erythematous and not bulging. A middle ear effusion is present.  Nose: Nose normal. Right sinus exhibits no maxillary sinus tenderness and no frontal sinus tenderness. Left sinus exhibits no maxillary sinus tenderness and no frontal sinus tenderness.  Mouth/Throat: Uvula is midline, oropharynx is clear and moist and mucous membranes are normal.  Eyes: Conjunctivae, EOM and lids are normal. Pupils are equal, round, and reactive to light.  Neck: Normal range of motion. Neck supple. No spinous process tenderness and no muscular tenderness present. Normal range of motion present.  Cardiovascular: Normal rate, regular rhythm and normal heart sounds. Exam reveals no gallop and no friction rub.  No murmur heard. Pulmonary/Chest: Effort normal and breath sounds  normal. She has no decreased breath sounds. She has no wheezes. She has no rhonchi. She has no rales.  Lymphadenopathy:    She has no cervical adenopathy.  Neurological: She is alert and oriented to person, place, and time. She has normal strength. She is not disoriented. No cranial nerve deficit or sensory deficit. Coordination and gait normal. GCS eye subscore is 4. GCS verbal subscore is 5. GCS motor subscore is 6.  Skin: Skin is warm and dry.  Psychiatric: She has a normal mood and affect. Her behavior is normal. Judgment normal.     UC Treatments / Results  Labs (all labs ordered are listed, but only abnormal results are displayed) Labs Reviewed - No data to display  EKG  EKG Interpretation None       Radiology No results found.  Procedures Procedures (including critical care time)  Medications Ordered in UC Medications  ketorolac (TORADOL) injection 60 mg (60 mg Intramuscular Given 09/17/17 1915)  metoCLOPramide (REGLAN) injection 5 mg (5 mg Intramuscular Given 09/17/17 1916)  dexamethasone (DECADRON) injection 10 mg (10 mg Intramuscular Given 09/17/17 1916)     Initial Impression / Assessment and Plan / UC Course  I have reviewed the triage vital signs and the nursing notes.  Pertinent labs & imaging results that were available during my care of the patient were reviewed by me and considered in my medical decision making (see chart for details).    No alarming signs on exam.  Toradol, Decadron, Reglan injection in office today.  Discussed possible sinus pressure/middle ear effusion causing symptoms.  Start Flonase as directed.  She can take Mobic for any residual headache.  Strict return precautions given.  Patient expresses understanding and agrees to plan.  Final Clinical Impressions(s) / UC Diagnoses   Final diagnoses:  Acute intractable headache, unspecified headache type  Fluid level behind tympanic membrane of both ears    ED Discharge Orders         Ordered    fluticasone (FLONASE) 50 MCG/ACT nasal spray  Daily     09/17/17 1911    meloxicam (MOBIC) 7.5 MG tablet  Daily     09/17/17 1911        Ok Edwards, PA-C 09/17/17 1920

## 2017-09-17 NOTE — ED Triage Notes (Signed)
Pt here for pressure behind her left eye. sts that it occurred yesterday when she felt a pop  And then the pain radiated into face. Pt still there today. Hx of migraines. sts that this is different. Denies vision changes. sts some dizziness and nausea.

## 2017-09-19 ENCOUNTER — Ambulatory Visit: Payer: Self-pay

## 2017-09-19 NOTE — Telephone Encounter (Signed)
Phone call from pt.  Reported she was in the ER on Sunday and was treated for a possible migraine headache.  Reported she was given a "headache cocktail of Toradol, Decadron, and Reglan."  Stated she developed intermittent tingling in hands, and weakness/ heaviness of legs.  Stated "my calves feel heavy like I have been working out."   Also, noted a intermittent episodes of feeling heart beat in her neck; stated that it feels like she has to take a breath at that time; "like I get winded."  Reported an episode at 4:30 PM yesterday, and again at 8:30 AM this morning.  Episode lasted several minutes between 8:30 and 8:40 AM. Stated both symptoms of weakness in lower legs and feeling heart beat in throat occur with increased activity; ie: walking up flight of stairs and also in lifting boxes.  Reported intermittent flushing in her face.  voiced concern about reaction to medication.  Was advised to start Meloxicam and Flonase for fluid in ears and sinus pressure.  Reported she has only taken one dose of each of above medications, due to delay in getting them filled at correct pharmacy.  Requested an appt. For evaluation of above symptoms.    Appt. Scheduled for 1/23 @ 3:00 PM; advised to call back or go to ER if symptoms worsen.  Verb. Understanding.      Reason for Disposition . [1] MODERATE weakness (i.e., interferes with work, school, normal activities) AND [2] persists > 3 days  Answer Assessment - Initial Assessment Questions 1. DESCRIPTION: "Describe how you are feeling."   My lower legs / calves feel heavy like I've been working out; feels like heart beating in her throat with activity (lifting boxes and walking up a flight of stairs)  2. SEVERITY: "How bad is it?"  "Can you stand and walk?"   - MILD - Feels weak or tired, but does not interfere with work, school or normal activities   - [place] to stand and walk; weakness interferes with work, school, or normal activities   - SEVERE - Unable  to stand or walk     mild 3. ONSET:  "When did the weakness begin?"     Legs - Yesterday prior to lunch; heart beat in throat - 4:30 PM yesterday 4. CAUSE: "What do you think is causing the weakness?"     Unknown 5. MEDICINES: "Have you recently started a new medicine or had a change in the amount of a medicine?"     Flonase, Meloxicam 6. OTHER SYMPTOMS: "Do you have any other symptoms?" (e.g., chest pain, fever, cough, SOB, vomiting, diarrhea, bleeding)     Feels a tightness in throat and sensation of heart beating in throat; intermittent flushing of the chin and neck 7. PREGNANCY: "Is there any chance you are pregnant?" "When was your last menstrual period?"     No; LMP 09/09/17  Protocols used: WEAKNESS (GENERALIZED) AND FATIGUE-A-AH

## 2017-09-20 ENCOUNTER — Encounter: Payer: Self-pay | Admitting: Internal Medicine

## 2017-09-20 ENCOUNTER — Ambulatory Visit (INDEPENDENT_AMBULATORY_CARE_PROVIDER_SITE_OTHER): Payer: No Typology Code available for payment source | Admitting: Internal Medicine

## 2017-09-20 VITALS — BP 122/78 | HR 66 | Temp 98.5°F | Wt 193.0 lb

## 2017-09-20 DIAGNOSIS — H6983 Other specified disorders of Eustachian tube, bilateral: Secondary | ICD-10-CM

## 2017-09-20 DIAGNOSIS — R29898 Other symptoms and signs involving the musculoskeletal system: Secondary | ICD-10-CM | POA: Diagnosis not present

## 2017-09-20 DIAGNOSIS — G43019 Migraine without aura, intractable, without status migrainosus: Secondary | ICD-10-CM | POA: Diagnosis not present

## 2017-09-20 DIAGNOSIS — R0602 Shortness of breath: Secondary | ICD-10-CM

## 2017-09-20 DIAGNOSIS — H6993 Unspecified Eustachian tube disorder, bilateral: Secondary | ICD-10-CM

## 2017-09-20 DIAGNOSIS — R202 Paresthesia of skin: Secondary | ICD-10-CM | POA: Diagnosis not present

## 2017-09-20 DIAGNOSIS — R221 Localized swelling, mass and lump, neck: Secondary | ICD-10-CM

## 2017-09-20 LAB — COMPREHENSIVE METABOLIC PANEL
ALK PHOS: 53 U/L (ref 39–117)
ALT: 15 U/L (ref 0–35)
AST: 13 U/L (ref 0–37)
Albumin: 4 g/dL (ref 3.5–5.2)
BUN: 13 mg/dL (ref 6–23)
CHLORIDE: 104 meq/L (ref 96–112)
CO2: 29 meq/L (ref 19–32)
Calcium: 8.9 mg/dL (ref 8.4–10.5)
Creatinine, Ser: 0.74 mg/dL (ref 0.40–1.20)
GFR: 91.4 mL/min (ref >60.00)
GLUCOSE: 98 mg/dL (ref 70–99)
POTASSIUM: 3.5 meq/L (ref 3.5–5.1)
SODIUM: 141 meq/L (ref 135–145)
TOTAL PROTEIN: 6.6 g/dL (ref 6.0–8.3)
Total Bilirubin: 0.6 mg/dL (ref 0.2–1.2)

## 2017-09-20 LAB — TSH: TSH: 2.02 u[IU]/mL (ref 0.35–4.50)

## 2017-09-20 LAB — VITAMIN D 25 HYDROXY (VIT D DEFICIENCY, FRACTURES): VITD: 14.88 ng/mL — ABNORMAL LOW (ref 30.00–100.00)

## 2017-09-20 LAB — CBC
HEMATOCRIT: 38.3 % (ref 36.0–46.0)
Hemoglobin: 12.8 g/dL (ref 12.0–15.0)
MCHC: 33.6 g/dL (ref 30.0–36.0)
MCV: 85.5 fl (ref 78.0–100.0)
Platelets: 300 10*3/uL (ref 150.0–400.0)
RBC: 4.47 Mil/uL (ref 3.87–5.11)
RDW: 14.3 % (ref 11.5–15.5)
WBC: 8.7 10*3/uL (ref 4.0–10.5)

## 2017-09-20 LAB — VITAMIN B12: Vitamin B-12: 310 pg/mL (ref 211–911)

## 2017-09-20 MED ORDER — METHYLPREDNISOLONE ACETATE 80 MG/ML IJ SUSP
80.0000 mg | Freq: Once | INTRAMUSCULAR | Status: AC
  Administered 2017-09-20: 80 mg via INTRAMUSCULAR

## 2017-09-20 NOTE — Progress Notes (Signed)
Subjective:    Patient ID: Vanessa Byrd, female    DOB: [birth year], 42 y.o.   MRN: [id number]  HPI  Pt presents to the clinic today for ER Follow Up. She went to the ER 1/20, with c/o a sharp stabbing pain above her right eye. The pain radiated into the right side of her jaw. The pain only lasted a few seconds, but throughout the night, she continued to not feel well, had some ear fullness and nausea. She denies visual changes but did feel a little lightheaded. She denied vomiting or diarrhea. She was diagnosed with ETD, bilateral and a migraine. She was treated with a migraine cocktail, RX for Meloxicam and she was advised to get Flonase OTC. She reports at work on Monday, she felt some tingling in her hands and weakness in her legs. This lasted for about an hour, and then resolved. She took her first dose of Meloxicam and Flonase Tuesday morning. Since that time, she has had some facial flushing, feelings of throat swelling and a sensation of a lump in her throat, and some chest heaviness. She is not running fevers. She denies swelling of the eyes, lips or tongue. She denies difficulty swallowing. She denies chest pain or shortness of breath, but reports she feels like she can not take a deep breath. She denies cough or chest congestion. She denies nausea, vomiting or diarrhea. She has not tried anything OTC for her symptoms. She stopped taking the Meloxicam and Flonase in case she was having an reaction to them.   Review of Systems      Past Medical History:  Diagnosis Date  . H. pylori infection   . Sinusitis     Current Outpatient Medications  Medication Sig Dispense Refill  . acetaminophen-codeine (TYLENOL #3) 300-30 MG tablet Take 1 tablet by mouth every 8 (eight) hours as needed for moderate pain. 15 tablet 0  . fluticasone (FLONASE) 50 MCG/ACT nasal spray Place 2 sprays into both nostrils daily. 16 g 0  . meloxicam (MOBIC) 7.5 MG tablet Take 1 tablet (7.5 mg total) by mouth  daily. 15 tablet 0   No current facility-administered medications for this visit.     Allergies  Allergen Reactions  . Amoxicillin     REACTION: swelling. angioedema    Family History  Problem Relation Age of Onset  . GER disease Mother   . Colon cancer Neg Hx     Social History   Socioeconomic History  . Marital status: Married    Spouse name: Not on file  . Number of children: Not on file  . Years of education: Not on file  . Highest education level: Not on file  Social Needs  . Financial resource strain: Not on file  . Food insecurity - worry: Not on file  . Food insecurity - inability: Not on file  . Transportation needs - medical: Not on file  . Transportation needs - non-medical: Not on file  Occupational History  . Not on file  Tobacco Use  . Smoking status: Never Smoker  . Smokeless tobacco: Never Used  Substance and Sexual Activity  . Alcohol use: No  . Drug use: No  . Sexual activity: Not on file  Other Topics Concern  . Not on file  Social History Narrative  . Not on file     Constitutional: Denies fever, malaise, fatigue, headache or abrupt weight changes.  HEENT: Pt reports sensation of throat swelling, lump in throat. Denies eye pain,  eye redness, ear pain, ringing in the ears, wax buildup, runny nose, nasal congestion, bloody nose, or sore throat. Respiratory: Pt reports difficulty taking a deep breath. Denies difficulty breathing, shortness of breath, cough or sputum production.   Cardiovascular: Pt reports chest heaviness. Denies chest pain, chest tightness, palpitations or swelling in the hands or feet.  Gastrointestinal: Denies abdominal pain, bloating, constipation, diarrhea or blood in the stool.  GU: Denies urgency, frequency, pain with urination, burning sensation, blood in urine, odor or discharge. Musculoskeletal: Denies decrease in range of motion, difficulty with gait, muscle pain or joint pain and swelling.  Skin: Denies redness,  rashes, lesions or ulcercations.  Neurological: Denies dizziness, difficulty with memory, difficulty with speech or problems with balance and coordination.    No other specific complaints in a complete review of systems (except as listed in HPI above).  Objective:   Physical Exam   BP 122/78   Pulse 66   Temp 98.5 F (36.9 C) (Oral)   Wt 193 lb (87.5 kg)   LMP 09/08/2017   SpO2 98%   BMI 34.19 kg/m  Wt Readings from Last 3 Encounters:  09/20/17 193 lb (87.5 kg)  01/03/17 186 lb (84.4 kg)  11/22/16 182 lb 12 oz (82.9 kg)    General: Appears her stated age, obese in NAD. Skin: Warm, dry and intact. No rashes noted. HEENT: Head: normal shape and size, no sinus tenderness noted; Ears: Tm's gray and intact, normal light reflex, + serous effusion bilaterally; Throat/Mouth: Teeth present, mucosa pink and moist, no exudate, lesions or ulcerations noted.  Neck:  Neck supple, trachea midline. No masses, lumps present. Thyromegaly noted. Cardiovascular: Normal rate and rhythm. S1,S2 noted.  No murmur, rubs or gallops noted.  Pulmonary/Chest: Normal effort and positive vesicular breath sounds. No respiratory distress. No wheezes, rales or ronchi noted.  Abdomen: Soft and nontender. Normal bowel sounds. No distention or masses noted.  Musculoskeletal: Strength 5/5 BLE. She is able to walk on heels and toes. No difficulty with gait.  Neurological: Alert and oriented. Coordination normal.  Psychiatric: She is very anxious appearing.  BMET    Component Value Date/Time   NA 140 03/08/2015 1449   K 3.8 03/08/2015 1449   CL 103 03/08/2015 1449   CO2 25 10/23/2013 0903   GLUCOSE 93 03/08/2015 1449   BUN 15 03/08/2015 1449   CREATININE 0.60 03/08/2015 1449   CALCIUM 9.2 10/23/2013 0903   GFRNONAA >90 10/23/2013 0903   GFRAA >90 10/23/2013 0903    Lipid Panel  No results found for: CHOL, TRIG, HDL, CHOLHDL, VLDL, LDLCALC  CBC    Component Value Date/Time   WBC 9.4 10/23/2013 0903     RBC 4.94 10/23/2013 0903   HGB 13.9 03/08/2015 1449   HCT 41.0 03/08/2015 1449   PLT 306 10/23/2013 0903   MCV 80.8 10/23/2013 0903   MCH 26.7 10/23/2013 0903   MCHC 33.1 10/23/2013 0903   RDW 14.2 10/23/2013 0903   LYMPHSABS 2.6 10/23/2013 0903   MONOABS 0.5 10/23/2013 0903   EOSABS 0.1 10/23/2013 0903   BASOSABS 0.0 10/23/2013 0903    Hgb A1C No results found for: HGBA1C         Assessment & Plan:   Migraine, ETD, Paresthesia of Hands, Lower Extremity Weakness, Sensation of Throat Swelling, SOB:  ER notes, labs and imaging reviewed Exam benign today Will check CBC, CMET, TSH, B12 and Vit D Could have been an allergic reaction but ? To which med Advised her  to not take the Meloxicam Should be okay to restart the Flonase   Will follow up after labs, return/ER precautions discussed Webb Silversmith, NP

## 2017-09-20 NOTE — Addendum Note (Signed)
Addended by: Lurlean Nanny on: 09/20/2017 02:57 PM   Modules accepted: Orders

## 2017-09-20 NOTE — Patient Instructions (Signed)
Barotitis Media Barotitis media is inflammation of the middle ear. This condition occurs when an auditory tube (eustachian tube) is blocked in one or both ears. These tubes lead from the middle ear to the back of the nose (nasopharynx). This condition typically occurs when you experience changes in pressure, such as when flying or scuba diving. Untreated barotitis media may lead to damage or hearing loss (barotrauma), which may become permanent. What are the causes? This condition may be caused by changes in air pressure from:  Flying.  Scuba diving.  A nearby explosion. What increases the risk? The following factors may make you more likely to develop this condition:  Middle ear infection.  Sinus infection.  A cold.  Environmental allergies.  Small eustachian tubes.  Recent ear surgery. What are the signs or symptoms? Symptoms of this condition may include:  Ear pain.  Hearing loss. In severe cases, symptoms can include:  Dizziness and nausea (vertigo).  Temporary facial paralysis. How is this diagnosed? This condition is diagnosed based on:  A physical exam. Your health care provider may:  Use a device (otoscope) to look into your ear canal and check your eardrum.  Do a test that changes air pressure in the middle ear to check how well the eardrum moves and to see if the eustachian tube is working(tympanogram).  Your medical history. In some cases, your health care provider may have you take a hearing test. You may also be referred to someone who specializes in ear treatment (otolaryngologist, "ENT"). How is this treated? This condition may be treated with:  Medicines to relieve congestion in your nose, sinus, or upper respiratory tract (decongestants).  Techniques to equalize pressure (to "pop" your ears), such as:  Yawning.  Chewing gum.  Swallowing. In severe cases, you may need surgery to relieve your symptoms or to prevent future inflammation. Follow  these instructions at home:  Take over-the-counter and prescription medicines only as told by your health care provider.  Do not put anything into your ears to clean or unplug them. Ear drops will not help.  Keep all follow-up visits as told by your health care provider. This is important. How is this prevented? Using these strategies may help to prevent barotitis media:  Chewing gum with frequent, forceful swallowing during takeoff and landing when flying.  Holding your nose and gently blowing to pop your ears for equalizing pressure changes. This forces air into the eustachian tube.  Yawning during air pressure changes.  Using a nasal decongestant about 30-60 minutes before flying, if you have nasal congestion. Contact a health care provider if:  You have vertigo.  You have hearing loss.  Your symptoms do not get better or they get worse.  You have a fever. Get help right away if:  You have a severe headache, ear pain, and dizziness.  You have balance problems.  You cannot move or feel part of your face.  You have bloody or pus-like drainage from your ears. Summary  Barotitis media is inflammation of the middle ear.  This condition typically occurs when you experience changes in pressure, such as when flying or scuba diving.  You may be at a higher risk for this condition if you have small eustachian tubes, had recent ear surgery, or have allergies, a cold, or sinus or middle ear infection.  This condition may be treated with medicines or techniques to equalize pressure in your ears.  Strategies can be used to help prevent barotitis media. This information is   not intended to replace advice given to you by your health care provider. Make sure you discuss any questions you have with your health care provider. Document Released: 08/12/2000 Document Revised: 07/04/2016 Document Reviewed: 07/04/2016 Elsevier Interactive Patient Education  2017 [employer]  

## 2017-09-21 ENCOUNTER — Other Ambulatory Visit: Payer: Self-pay | Admitting: Internal Medicine

## 2017-09-21 DIAGNOSIS — E559 Vitamin D deficiency, unspecified: Secondary | ICD-10-CM

## 2017-09-21 MED ORDER — VITAMIN D (ERGOCALCIFEROL) 1.25 MG (50000 UNIT) PO CAPS: 50000.0000 [IU] | ORAL_CAPSULE | ORAL | 0 refills | Status: DC

## 2017-12-11 ENCOUNTER — Telehealth: Payer: Self-pay | Admitting: Internal Medicine

## 2017-12-11 NOTE — Telephone Encounter (Signed)
Baity pt, Pt has not been on this medication in over 1 yr... Was on Prozac 20mg ... Please advise as pt has not had OV to talk about anxiety/depression since 2014... Do you think pt should schedule appt with Rollene Fare to discuss as I do not see notes in reference to this medication in any other OV note?

## 2017-12-11 NOTE — Telephone Encounter (Signed)
Please schedule a f/u with Webb Silversmith when she returns Thanks

## 2017-12-11 NOTE — Telephone Encounter (Signed)
Copied from [place] [id number]. Topic: Quick Communication - See Telephone Encounter >> Dec 11, 2017  4:01 PM Robina Ade, Helene Kelp D wrote: CRM for notification. See Telephone encounter for: 12/11/17. Patient called and said that [facility]  prescribed prozac and wanted to talk to the [facility] or Rollene Fare about starting back on it. Please call patient back.

## 2017-12-12 ENCOUNTER — Ambulatory Visit: Payer: Self-pay

## 2017-12-12 ENCOUNTER — Encounter: Payer: Self-pay | Admitting: Family Medicine

## 2017-12-12 ENCOUNTER — Telehealth: Payer: Self-pay

## 2017-12-12 ENCOUNTER — Ambulatory Visit (INDEPENDENT_AMBULATORY_CARE_PROVIDER_SITE_OTHER): Payer: No Typology Code available for payment source | Admitting: Family Medicine

## 2017-12-12 VITALS — BP 122/72 | HR 71 | Temp 98.3°F | Wt 184.8 lb

## 2017-12-12 DIAGNOSIS — R1031 Right lower quadrant pain: Secondary | ICD-10-CM | POA: Diagnosis not present

## 2017-12-12 DIAGNOSIS — R110 Nausea: Secondary | ICD-10-CM

## 2017-12-12 LAB — CBC WITH DIFFERENTIAL/PLATELET
Basophils Absolute: 0 10*3/uL (ref 0.0–0.1)
Basophils Relative: 0.4 % (ref 0.0–3.0)
EOS PCT: 0.8 % (ref 0.0–5.0)
Eosinophils Absolute: 0.1 10*3/uL (ref 0.0–0.7)
HCT: 42.1 % (ref 36.0–46.0)
Hemoglobin: 14.3 g/dL (ref 12.0–15.0)
LYMPHS ABS: 2.7 10*3/uL (ref 0.7–4.0)
Lymphocytes Relative: 34.9 % (ref 12.0–46.0)
MCHC: 34 g/dL (ref 30.0–36.0)
MCV: 83.3 fl (ref 78.0–100.0)
MONO ABS: 0.3 10*3/uL (ref 0.1–1.0)
MONOS PCT: 3.6 % (ref 3.0–12.0)
NEUTROS ABS: 4.7 10*3/uL (ref 1.4–7.7)
NEUTROS PCT: 60.3 % (ref 43.0–77.0)
PLATELETS: 284 10*3/uL (ref 150.0–400.0)
RBC: 5.05 Mil/uL (ref 3.87–5.11)
RDW: 13.3 % (ref 11.5–15.5)
WBC: 7.7 10*3/uL (ref 4.0–10.5)

## 2017-12-12 LAB — POC URINALSYSI DIPSTICK (AUTOMATED)
BILIRUBIN UA: NEGATIVE
GLUCOSE UA: NEGATIVE
KETONES UA: NEGATIVE
Leukocytes, UA: NEGATIVE
NITRITE UA: NEGATIVE
PH UA: 6.5 (ref 5.0–8.0)
Protein, UA: NEGATIVE
RBC UA: NEGATIVE
SPEC GRAV UA: 1.015 (ref 1.010–1.025)
Urobilinogen, UA: 0.2 E.U./dL

## 2017-12-12 LAB — COMPREHENSIVE METABOLIC PANEL
ALK PHOS: 65 U/L (ref 39–117)
ALT: 20 U/L (ref 0–35)
AST: 15 U/L (ref 0–37)
Albumin: 4.4 g/dL (ref 3.5–5.2)
BUN: 10 mg/dL (ref 6–23)
CO2: 27 meq/L (ref 19–32)
Calcium: 9.3 mg/dL (ref 8.4–10.5)
Chloride: 105 mEq/L (ref 96–112)
Creatinine, Ser: 0.63 mg/dL (ref 0.40–1.20)
GFR: 109.93 mL/min (ref >60.00)
GLUCOSE: 101 mg/dL — AB (ref 70–99)
POTASSIUM: 3.5 meq/L (ref 3.5–5.1)
SODIUM: 139 meq/L (ref 135–145)
TOTAL PROTEIN: 6.9 g/dL (ref 6.0–8.3)
Total Bilirubin: 1.4 mg/dL — ABNORMAL HIGH (ref 0.2–1.2)

## 2017-12-12 LAB — POCT URINE PREGNANCY: Preg Test, Ur: NEGATIVE

## 2017-12-12 LAB — LIPASE: LIPASE: 24 U/L (ref 11.0–59.0)

## 2017-12-12 MED ORDER — TRAMADOL HCL 50 MG PO TABS: 50.0000 mg | ORAL_TABLET | Freq: Two times a day (BID) | ORAL | 0 refills | Status: DC | PRN

## 2017-12-12 MED FILL — traMADol HCL 50 MG TABS: 50 | 10 days supply | Qty: 20 | Fill #0

## 2017-12-12 NOTE — Assessment & Plan Note (Addendum)
Ongoing over 10 days, associated with marked nausea and loss of appetite. She did have rash on R lower abdomen 1 month ago that cleared up with OTC hydrocortisone. Anticipate post-herpetic neuralgia. Ddx includes R sided diverticulitis, nephrolithiasis, ovarian cyst, other intra abdominal process like appendicitis.  Exam today not consistent with acute appendicitis or acute abdomen. Check labs and UA today. Upreg today.  Red flags to seek urgent care reviewed. Update [place] if not improving with treatment. Rx ibuprofen 600mg  with meals, tramadol for breakthrough pain. Pt agrees with plan.  UA normal.  Upreg negative.

## 2017-12-12 NOTE — Patient Instructions (Addendum)
Urine check today - normal.  Labs today. 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 Let us know if fever> 101, worsening pain or not improving with treatment.  Postherpetic Neuralgia Postherpetic neuralgia (PHN) is nerve pain that occurs after a shingles infection. Shingles is a painful rash that appears on one side of the body, usually on your trunk or face. Shingles is caused by the varicella-zoster virus. This is the same virus that causes chickenpox. In people who have had chickenpox, the virus can resurface years later and cause shingles. You may have PHN if you continue to have pain for 3 months after your shingles rash has gone away. PHN appears in the same area where you had the shingles rash. For most people, PHN goes away within 1 year. Getting a vaccination for shingles can prevent PHN. This vaccine is recommended for people older than 50. It may prevent shingles and may also lower your risk of PHN if you do get shingles. What are the causes? PHN is caused by damage to your nerves from the varicella-zoster virus. This damage makes your nerves overly sensitive. What increases the risk? Aging is the biggest risk factor for developing PHN. Most people who get PHN are older than 18. Other risk factors include:  Having very bad pain before your shingles rash starts.  Having a very bad rash.  Having shingles in the nerve that supplies your face and eye (trigeminal nerve).  What are the signs or symptoms? Pain is the main symptom of PHN. The pain is often very bad and may be described as stabbing, burning, or feeling like an electric shock. The pain may come and go or may be there all the time. Pain may be triggered by light touches on the skin or changes in temperature. You may have itching along with the pain. How is this diagnosed? Your health care provider may diagnose PHN based on your symptoms  and your history of shingles. Lab studies and other diagnostic tests are usually not needed. How is this treated? There is no cure for PHN. Treatment for PHN will focus on pain relief. Over-the-counter pain relievers do not usually relieve PHN pain. You may need to work with a pain specialist. Treatment may include:  Antidepressant medicines to help with pain and improve sleep.  Antiseizure medicines to relieve nerve pain.  Strong pain relievers (opioids).  A numbing patch worn on the skin (lidocaine patch).  Follow these instructions at home: It may take a long time to recover from PHN. Work closely with your health care provider, and have a good support system at home.  Take all medicines as directed by your health care provider.  Wear loose, comfortable clothing.  Cover sensitive areas with a dressing to reduce friction from clothing rubbing on the area.  If cold does not make your pain worse, try applying a cool compress or cooling gel pack to the area.  Talk to your health care provider if you feel depressed or desperate. Living with long-term pain can be depressing.  Contact a health care provider if:  Your medicine is not helping.  You are struggling to manage your pain at home. This information is not intended to replace advice given to you by your health care provider. Make sure you discuss any questions you have with your health care provider. Document Released: 11/05/2002 Document Revised: 01/21/2016 Document Reviewed: 08/06/2013 Elsevier Interactive Patient Education  2018  [employer].

## 2017-12-12 NOTE — Progress Notes (Signed)
BP 122/72 (BP Location: Right Arm, Patient Position: Sitting, Cuff Size: Normal)   Pulse 71   Temp 98.3 F (36.8 C) (Oral)   Wt 184 lb 12 oz (83.8 kg)   LMP 09/13/2017 (Approximate)   SpO2 99%   BMI 32.73 kg/m    CC: abd pain, nausea Subjective:    Patient ID: ADAMARY SAVARY, female    DOB: 08-26-1976, 42 y.o.   MRN: [id number]  HPI: MARC SIVERTSEN is a 42 y.o. female presenting on 12/12/2017 for Nausea (Associated with right side pain. Started about a week and a half ago. Pt states the pain is waking her up in the night and cannot stand the smell of anything without feeling sick.)   Today when she awoke noted sharp R lower abdominal discomfort associated with nausea with radiation to flank. 1.5 wks ago sharp RLQ pain woke her up from sleep - it did get better with time however she's not felt well all week. Has had to leave work due to feeling ill. H/o ovarian cysts, she thought this was recurrence. Mild diarrhea. No appetite. 9 lb weight loss in last several months. Any food smell worsens pain/nausea. Bending at waist worsens pain. Some improvement with laying down and rest.   No fevers/chills, vomiting, constipation, blood in stool, urinary symptoms of dysuria, urgency, hematuria, frequency.  No h/o kidneys stones.  No h/o shingles.   By the way - had tender erythematous rash R lower abdomen that blistered then dried up 1 month ago, treated with cortisone-10.   Relevant past medical, surgical, family and social history reviewed and updated as indicated. Interim medical history since our last visit reviewed. Allergies and medications reviewed and updated. Outpatient Medications Prior to Visit  Medication Sig Dispense Refill  . fluticasone (FLONASE) 50 MCG/ACT nasal spray Place 2 sprays into both nostrils daily. 16 g 0  . meloxicam (MOBIC) 7.5 MG tablet Take 1 tablet (7.5 mg total) by mouth daily. (Patient not taking: Reported on 12/12/2017) 15 tablet 0  . Vitamin D,  Ergocalciferol, (DRISDOL) 50000 units CAPS capsule Take 1 capsule (50,000 Units total) by mouth every 7 (seven) days. (Patient not taking: Reported on 12/12/2017) 12 capsule 0   No facility-administered medications prior to visit.      Per HPI unless specifically indicated in ROS section below Review of Systems     Objective:    BP 122/72 (BP Location: Right Arm, Patient Position: Sitting, Cuff Size: Normal)   Pulse 71   Temp 98.3 F (36.8 C) (Oral)   Wt 184 lb 12 oz (83.8 kg)   LMP 09/13/2017 (Approximate)   SpO2 99%   BMI 32.73 kg/m   Wt Readings from Last 3 Encounters:  12/12/17 184 lb 12 oz (83.8 kg)  09/20/17 193 lb (87.5 kg)  01/03/17 186 lb (84.4 kg)    Physical Exam  Constitutional: She appears well-developed and well-nourished. No distress.  HENT:  Head: Normocephalic and atraumatic.  Mouth/Throat: Oropharynx is clear and moist. No oropharyngeal exudate.  Eyes: Pupils are equal, round, and reactive to light. EOM are normal.  Neck: Normal range of motion. Neck supple.  Cardiovascular: Normal rate, regular rhythm and normal heart sounds.  No murmur heard. Pulmonary/Chest: Effort normal and breath sounds normal. No stridor. No respiratory distress. She has no wheezes. She has no rales.  Abdominal: Soft. Bowel sounds are normal. She exhibits no distension and no mass. There is no hepatosplenomegaly. There is tenderness in the right lower quadrant, suprapubic area,  left upper quadrant and left lower quadrant. There is no rigidity, no rebound, no guarding, no CVA tenderness and negative Murphy's sign. No hernia.  Neg psoas sign  Musculoskeletal: She exhibits no edema.  Skin: Skin is warm and dry. There is erythema (drying vesicular erythematous rash RLQ abdomen).     Nursing note and vitals reviewed.  Results for orders placed or performed in visit on 12/12/17  POCT Urinalysis Dipstick (Automated)  Result Value Ref Range   Color, UA yellow    Clarity, UA clear     Glucose, UA negative    Bilirubin, UA negative    Ketones, UA negative    Spec Grav, UA 1.015 1.010 - 1.025   Blood, UA negative    pH, UA 6.5 5.0 - 8.0   Protein, UA negative    Urobilinogen, UA 0.2 0.2 or 1.0 E.U./dL   Nitrite, UA negative    Leukocytes, UA Negative Negative  POCT urine pregnancy  Result Value Ref Range   Preg Test, Ur Negative Negative      Assessment & Plan:   Problem List Items Addressed This Visit    RLQ abdominal pain - Primary    Ongoing over 10 days, associated with marked nausea and loss of appetite. She did have rash on R lower abdomen 1 month ago that cleared up with OTC hydrocortisone. Anticipate post-herpetic neuralgia. Ddx includes R sided diverticulitis, nephrolithiasis, ovarian cyst, other intra abdominal process like appendicitis.  Exam today not consistent with acute appendicitis or acute abdomen. Check labs and UA today. Upreg today.  Red flags to seek urgent care reviewed. Update [place] if not improving with treatment. Rx ibuprofen 600mg  with meals, tramadol for breakthrough pain. Pt agrees with plan.  UA normal.  Upreg negative.       Relevant Orders   Comprehensive metabolic panel   CBC with Differential/Platelet   POCT Urinalysis Dipstick (Automated) (Completed)    Other Visit Diagnoses    Nausea       Relevant Orders   Lipase   POCT urine pregnancy (Completed)       Meds ordered this encounter  Medications  . traMADol (ULTRAM) 50 MG tablet    Sig: Take 1 tablet (50 mg total) by mouth 2 (two) times daily as needed for moderate pain.    Dispense:  20 tablet    Refill:  0   Orders Placed This Encounter  Procedures  . Comprehensive metabolic panel  . CBC with Differential/Platelet  . Lipase  . POCT Urinalysis Dipstick (Automated)  . POCT urine pregnancy    Follow up plan: Return if symptoms worsen or fail to improve.  Ria Bush, MD

## 2017-12-12 NOTE — Telephone Encounter (Signed)
Pt has appt with [facility] Monday

## 2017-12-12 NOTE — Telephone Encounter (Signed)
Copied from [place]. Topic: General - Other >> Dec 12, 2017 12:25 PM [name] wrote: Reason for CRM: Patient seen today by Dr Danise Mina, needs a work note. Requesting to be placed in Mychart

## 2017-12-12 NOTE — Telephone Encounter (Signed)
Spoke with pt about work note, explaining we cannot send it via [place].  Pt requests note be mailed to her.  Mailed note.

## 2017-12-12 NOTE — Telephone Encounter (Signed)
Pt. Reports she has abdominal pain that radiates to her back - on the right. States this woke her up in the middle of the night.  Does have some nausea this morning with this and "a little diarrhea." Denies any urinary symptoms. Denies any injury to her abdomen or back. Appointment made today by the agent. Answer Assessment - Initial Assessment Questions 1. LOCATION: "Where does it hurt?"      Right abdomen and radiates to the back 2. RADIATION: "Does the pain shoot anywhere else?" (e.g., chest, back)     No 3. ONSET: "When did the pain begin?" (e.g., minutes, hours or days ago)      Last week 4. SUDDEN: "Gradual or sudden onset?"     Gradual 5. PATTERN "Does the pain come and go, or is it constant?"    - If constant: "Is it getting better, staying the same, or worsening?"      (Note: Constant means the pain never goes away completely; most serious pain is constant and it progresses)     - If intermittent: "How long does it last?" "Do you have pain now?"     (Note: Intermittent means the pain goes away completely between bouts)     Comes and goes 6. SEVERITY: "How bad is the pain?"  (e.g., Scale 1-10; mild, moderate, or severe)   - MILD (1-3): doesn't interfere with normal activities, abdomen soft and not tender to touch    - MODERATE (4-7): interferes with normal activities or awakens from sleep, tender to touch    - SEVERE (8-10): excruciating pain, doubled over, unable to do any normal activities      6-7 7. RECURRENT SYMPTOM: "Have you ever had this type of abdominal pain before?" If so, ask: "When was the last time?" and "What happened that time?"      No 8. CAUSE: "What do you think is causing the abdominal pain?"     Unsure 9. RELIEVING/AGGRAVATING FACTORS: "What makes it better or worse?" (e.g., movement, antacids, bowel movement)     Bending over makes it worse 10. OTHER SYMPTOMS: "Has there been any vomiting, diarrhea, constipation, or urine problems?"       Nausea - small  amount of diarrhea yesterday and today 11. PREGNANCY: "Is there any chance you are pregnant?" "When was your last menstrual period?"       No  Protocols used: ABDOMINAL PAIN - [facility]

## 2017-12-18 ENCOUNTER — Ambulatory Visit (INDEPENDENT_AMBULATORY_CARE_PROVIDER_SITE_OTHER): Payer: No Typology Code available for payment source | Admitting: Internal Medicine

## 2017-12-18 ENCOUNTER — Encounter: Payer: Self-pay | Admitting: Internal Medicine

## 2017-12-18 VITALS — BP 102/78 | HR 75 | Temp 98.2°F | Wt 184.2 lb

## 2017-12-18 DIAGNOSIS — F329 Major depressive disorder, single episode, unspecified: Secondary | ICD-10-CM

## 2017-12-18 DIAGNOSIS — F419 Anxiety disorder, unspecified: Secondary | ICD-10-CM

## 2017-12-18 DIAGNOSIS — R1011 Right upper quadrant pain: Secondary | ICD-10-CM | POA: Diagnosis not present

## 2017-12-18 DIAGNOSIS — F32A Depression, unspecified: Secondary | ICD-10-CM

## 2017-12-18 MED ORDER — FLUOXETINE HCL 10 MG PO TABS: 10.0000 mg | ORAL_TABLET | Freq: Every day | ORAL | 2 refills | Status: DC

## 2017-12-18 NOTE — Progress Notes (Signed)
Subjective:    Patient ID: Vanessa Byrd, female    DOB: 31-May-1976, 42 y.o.   MRN: [id number]  HPI  Pt presents to the clinic today to follow up anxiety and depression. She feels like her symptoms have deteriorated over the last month. She reports she is having issues with her son, who is a Paramedic not wanting to go to school because he is having issues with IBS. She tried to go out of town and was worried about him the entire time, and she couldn't even enjoy herself. She cries at times, finds herself not wanting to leave the house. She would like to get restarted back on her Fluoxetine. She denies SI/HI.  She also reports persistent RUQ pain. This has been going on for 3 weeks. The pain is intermittent. It does seem to radiate into her back. She denies nausea, vomiting, diarrhea Tylenol and Ibuprofen as needed with minimal relief.  Review of Systems      Past Medical History:  Diagnosis Date  . H. pylori infection   . Sinusitis     Current Outpatient Medications  Medication Sig Dispense Refill  . fluticasone (FLONASE) 50 MCG/ACT nasal spray Place 2 sprays into both nostrils daily. 16 g 0  . meloxicam (MOBIC) 7.5 MG tablet Take 1 tablet (7.5 mg total) by mouth daily. (Patient not taking: Reported on 12/12/2017) 15 tablet 0  . traMADol (ULTRAM) 50 MG tablet Take 1 tablet (50 mg total) by mouth 2 (two) times daily as needed for moderate pain. 20 tablet 0  . Vitamin D, Ergocalciferol, (DRISDOL) 50000 units CAPS capsule Take 1 capsule (50,000 Units total) by mouth every 7 (seven) days. (Patient not taking: Reported on 12/12/2017) 12 capsule 0   No current facility-administered medications for this visit.     Allergies  Allergen Reactions  . Amoxicillin     REACTION: swelling. angioedema    Family History  Problem Relation Age of Onset  . GER disease Mother   . Colon cancer Neg Hx     Social History   Socioeconomic History  . Marital status: Married    Spouse name: Not  on file  . Number of children: Not on file  . Years of education: Not on file  . Highest education level: Not on file  Occupational History  . Not on file  Social Needs  . Financial resource strain: Not on file  . Food insecurity:    Worry: Not on file    Inability: Not on file  . Transportation needs:    Medical: Not on file    Non-medical: Not on file  Tobacco Use  . Smoking status: Never Smoker  . Smokeless tobacco: Never Used  Substance and Sexual Activity  . Alcohol use: No  . Drug use: No  . Sexual activity: Not on file  Lifestyle  . Physical activity:    Days per week: Not on file    Minutes per session: Not on file  . Stress: Not on file  Relationships  . Social connections:    Talks on phone: Not on file    Gets together: Not on file    Attends religious service: Not on file    Active member of club or organization: Not on file    Attends meetings of clubs or organizations: Not on file    Relationship status: Not on file  . Intimate partner violence:    Fear of current or ex partner: Not on file  Emotionally abused: Not on file    Physically abused: Not on file    Forced sexual activity: Not on file  Other Topics Concern  . Not on file  Social History Narrative  . Not on file     Constitutional: Denies fever, malaise, fatigue, headache or abrupt weight changes.  Respiratory: Denies difficulty breathing, shortness of breath, cough or sputum production.   Cardiovascular: Denies chest pain, chest tightness, palpitations or swelling in the hands or feet.  Gastrointestinal: Pt reports right upper quadrant abdominal pain. Denies bloating, constipation, diarrhea or blood in the stool.  GU: Denies urgency, frequency, pain with urination, burning sensation, blood in urine, odor or discharge. Psych: Pt reports anxiety and depression. Denies SI/HI.  No other specific complaints in a complete review of systems (except as listed in HPI above).  Objective:    Physical Exam  BP 102/78 (BP Location: Right Arm, Patient Position: Sitting, Cuff Size: Normal)   Pulse 75   Temp 98.2 F (36.8 C) (Oral)   Wt 184 lb 4 oz (83.6 kg)   LMP 09/02/2016 (Approximate)   SpO2 97%   BMI 32.64 kg/m  Wt Readings from Last 3 Encounters:  12/18/17 184 lb 4 oz (83.6 kg)  12/12/17 184 lb 12 oz (83.8 kg)  09/20/17 193 lb (87.5 kg)    General: Appears her stated age, well developed, well nourished in NAD. Cardiovascular: Normal rate and rhythm.  Pulmonary/Chest: Normal effort and positive vesicular breath sounds. No respiratory distress. No wheezes, rales or ronchi noted.  Abdomen: Soft and nontender. Normal bowel sounds. No distention or masses noted.  Neurological: Alert and oriented.  Psychiatric: She seems stressed but mood and affect normal. Behavior is normal. Judgment and thought content normal.    BMET    Component Value Date/Time   NA 140 12/18/2017 1628   K 3.6 12/18/2017 1628   CL 103 12/18/2017 1628   CO2 31 12/18/2017 1628   GLUCOSE 84 12/18/2017 1628   BUN 11 12/18/2017 1628   CREATININE 0.70 12/18/2017 1628   CALCIUM 9.1 12/18/2017 1628   GFRNONAA >90 10/23/2013 0903   GFRAA >90 10/23/2013 0903    Lipid Panel  No results found for: CHOL, TRIG, HDL, CHOLHDL, VLDL, LDLCALC  CBC    Component Value Date/Time   WBC 7.7 12/12/2017 1204   RBC 5.05 12/12/2017 1204   HGB 14.3 12/12/2017 1204   HCT 42.1 12/12/2017 1204   PLT 284.0 12/12/2017 1204   MCV 83.3 12/12/2017 1204   MCH 26.7 10/23/2013 0903   MCHC 34.0 12/12/2017 1204   RDW 13.3 12/12/2017 1204   LYMPHSABS 2.7 12/12/2017 1204   MONOABS 0.3 12/12/2017 1204   EOSABS 0.1 12/12/2017 1204   BASOSABS 0.0 12/12/2017 1204    Hgb A1C No results found for: HGBA1C          Assessment & Plan:   RUQ Pain:  Will check CMET and H Pylori today Consider RUQ ultrasound pending labs Avoid NSAID's for now  Anxiety and Depression:  Deteriorated Support offered today eRx for  Fluoxetine 10 mg daily  Will follow up with you after labs are back, follow up with me via mychart in 1 month and let me know how the anxiety and depression are doing Webb Silversmith, NP

## 2017-12-19 ENCOUNTER — Other Ambulatory Visit: Payer: Self-pay | Admitting: Obstetrics & Gynecology

## 2017-12-19 DIAGNOSIS — R1011 Right upper quadrant pain: Secondary | ICD-10-CM

## 2017-12-19 LAB — HM MAMMOGRAPHY: HM MAMMO: NORMAL (ref 0–4)

## 2017-12-19 LAB — COMPREHENSIVE METABOLIC PANEL
ALT: 25 U/L (ref 0–35)
AST: 15 U/L (ref 0–37)
Albumin: 4.2 g/dL (ref 3.5–5.2)
Alkaline Phosphatase: 60 U/L (ref 39–117)
BUN: 11 mg/dL (ref 6–23)
CHLORIDE: 103 meq/L (ref 96–112)
CO2: 31 meq/L (ref 19–32)
Calcium: 9.1 mg/dL (ref 8.4–10.5)
Creatinine, Ser: 0.7 mg/dL (ref 0.40–1.20)
GFR: 97.34 mL/min (ref >60.00)
GLUCOSE: 84 mg/dL (ref 70–99)
POTASSIUM: 3.6 meq/L (ref 3.5–5.1)
Sodium: 140 mEq/L (ref 135–145)
Total Bilirubin: 0.9 mg/dL (ref 0.2–1.2)
Total Protein: 6.5 g/dL (ref 6.0–8.3)

## 2017-12-19 LAB — H. PYLORI ANTIBODY, IGG: H Pylori IgG: NEGATIVE

## 2017-12-20 ENCOUNTER — Encounter: Payer: Self-pay | Admitting: Internal Medicine

## 2017-12-20 ENCOUNTER — Ambulatory Visit (HOSPITAL_COMMUNITY)
Admission: RE | Admit: 2017-12-20 | Discharge: 2017-12-20 | Disposition: A | Discharge status: Home / Self Care | Payer: No Typology Code available for payment source | Source: Ambulatory Visit | Attending: Obstetrics & Gynecology | Admitting: Obstetrics & Gynecology

## 2017-12-20 ENCOUNTER — Other Ambulatory Visit: Payer: Self-pay | Admitting: Obstetrics & Gynecology

## 2017-12-20 ENCOUNTER — Telehealth: Payer: Self-pay

## 2017-12-20 DIAGNOSIS — R928 Other abnormal and inconclusive findings on diagnostic imaging of breast: Secondary | ICD-10-CM

## 2017-12-20 DIAGNOSIS — R1011 Right upper quadrant pain: Secondary | ICD-10-CM | POA: Insufficient documentation

## 2017-12-20 NOTE — Telephone Encounter (Signed)
I haven't even gotten the results from the ultrasound. I will review the results once they come via fax or in my inbox and we will get in contact with her.

## 2017-12-20 NOTE — Patient Instructions (Signed)

## 2017-12-20 NOTE — Telephone Encounter (Signed)
Copied from [place] [id number]. Topic: General - Other >> Dec 20, 2017  2:53 PM Valla Leaver wrote: Reason for CRM: Patient had [place] of abdomen today and results were fine, however her OB doctor told her to get back in contact with her PCP NP Baity regarding the possible GI concern she is having. Patient needs to know what to do from here. Please advise.

## 2017-12-20 NOTE — Telephone Encounter (Signed)
Spoke to pt and advised per [facility]; verbally expressed understanding

## 2017-12-21 NOTE — Telephone Encounter (Signed)
Pt calling to check status of Vanessa Byrd receiving [place] results. Pt states she is not feeling well and wondering if she needs to go to ER or next steps. Pt requesting call back at [phone].

## 2017-12-21 NOTE — Telephone Encounter (Signed)
I did not receive the results. The went to Dr. Antonieta Pert. Does she know a doctor by that name. The ultrasound was normal so if her pain is worse, I would recommend UC or ER eval

## 2017-12-21 NOTE — Telephone Encounter (Signed)
Called and left a detailed voicemail informing her of results and recommendations.

## 2017-12-22 ENCOUNTER — Ambulatory Visit: Payer: No Typology Code available for payment source

## 2017-12-22 ENCOUNTER — Ambulatory Visit (HOSPITAL_COMMUNITY): Payer: No Typology Code available for payment source

## 2017-12-22 ENCOUNTER — Ambulatory Visit
Admission: RE | Admit: 2017-12-22 | Discharge: 2017-12-22 | Disposition: A | Discharge status: Home / Self Care | Payer: No Typology Code available for payment source | Source: Ambulatory Visit | Attending: Obstetrics & Gynecology | Admitting: Obstetrics & Gynecology

## 2017-12-22 DIAGNOSIS — R928 Other abnormal and inconclusive findings on diagnostic imaging of breast: Secondary | ICD-10-CM

## 2017-12-22 LAB — HM MAMMOGRAPHY

## 2017-12-24 DIAGNOSIS — K529 Noninfective gastroenteritis and colitis, unspecified: Secondary | ICD-10-CM | POA: Insufficient documentation

## 2017-12-24 DIAGNOSIS — R109 Unspecified abdominal pain: Secondary | ICD-10-CM | POA: Diagnosis present

## 2017-12-24 DIAGNOSIS — Z79899 Other long term (current) drug therapy: Secondary | ICD-10-CM | POA: Insufficient documentation

## 2017-12-25 ENCOUNTER — Encounter (HOSPITAL_COMMUNITY): Payer: Self-pay | Admitting: *Deleted

## 2017-12-25 ENCOUNTER — Emergency Department (HOSPITAL_COMMUNITY)
Admission: EM | Admit: 2017-12-25 | Discharge: 2017-12-25 | Disposition: A | Discharge status: Home / Self Care | Payer: No Typology Code available for payment source | Attending: Emergency Medicine | Admitting: Emergency Medicine

## 2017-12-25 ENCOUNTER — Other Ambulatory Visit: Payer: Self-pay

## 2017-12-25 ENCOUNTER — Telehealth: Payer: Self-pay

## 2017-12-25 ENCOUNTER — Emergency Department (HOSPITAL_COMMUNITY): Payer: No Typology Code available for payment source

## 2017-12-25 DIAGNOSIS — K529 Noninfective gastroenteritis and colitis, unspecified: Secondary | ICD-10-CM

## 2017-12-25 LAB — URINALYSIS, ROUTINE W REFLEX MICROSCOPIC
Bilirubin Urine: NEGATIVE
Glucose, UA: NEGATIVE mg/dL
Hgb urine dipstick: NEGATIVE
KETONES UR: NEGATIVE mg/dL
LEUKOCYTES UA: NEGATIVE
Nitrite: NEGATIVE
PH: 7 (ref 5.0–8.0)
Protein, ur: NEGATIVE mg/dL
Specific Gravity, Urine: 1.021 (ref 1.005–1.030)

## 2017-12-25 LAB — COMPREHENSIVE METABOLIC PANEL
ALT: 22 U/L (ref 14–54)
ANION GAP: 9 (ref 5–15)
AST: 18 U/L (ref 15–41)
Albumin: 4 g/dL (ref 3.5–5.0)
Alkaline Phosphatase: 64 U/L (ref 38–126)
BILIRUBIN TOTAL: 1.2 mg/dL (ref 0.3–1.2)
BUN: 6 mg/dL (ref 6–20)
CHLORIDE: 102 mmol/L (ref 101–111)
CO2: 27 mmol/L (ref 22–32)
Calcium: 9.2 mg/dL (ref 8.9–10.3)
Creatinine, Ser: 0.74 mg/dL (ref 0.44–1.00)
Glucose, Bld: 109 mg/dL — ABNORMAL HIGH (ref 65–99)
POTASSIUM: 3.4 mmol/L — AB (ref 3.5–5.1)
Sodium: 138 mmol/L (ref 135–145)
TOTAL PROTEIN: 6.7 g/dL (ref 6.5–8.1)

## 2017-12-25 LAB — CBC
HEMATOCRIT: 40.7 % (ref 36.0–46.0)
HEMOGLOBIN: 14.4 g/dL (ref 12.0–15.0)
MCH: 29.7 pg (ref 26.0–34.0)
MCHC: 35.4 g/dL (ref 30.0–36.0)
MCV: 83.9 fL (ref 78.0–100.0)
Platelets: 226 10*3/uL (ref 150–400)
RBC: 4.85 MIL/uL (ref 3.87–5.11)
RDW: 13.2 % (ref 11.5–15.5)
WBC: 11.1 10*3/uL — AB (ref 4.0–10.5)

## 2017-12-25 LAB — LIPASE, BLOOD: LIPASE: 38 U/L (ref 11–51)

## 2017-12-25 LAB — I-STAT BETA HCG BLOOD, ED (MC, WL, AP ONLY): I-stat hCG, quantitative: <5 m[IU]/mL (ref <5)

## 2017-12-25 MED ORDER — METRONIDAZOLE 500 MG PO TABS: 500.0000 mg | ORAL_TABLET | Freq: Two times a day (BID) | ORAL | 0 refills | Status: DC

## 2017-12-25 MED ORDER — ONDANSETRON 4 MG PO TBDP: 4.0000 mg | ORAL_TABLET | Freq: Once | ORAL | Status: DC | PRN

## 2017-12-25 MED ORDER — CIPROFLOXACIN HCL 500 MG PO TABS: 500.0000 mg | ORAL_TABLET | Freq: Two times a day (BID) | ORAL | 0 refills | Status: DC

## 2017-12-25 MED ORDER — SODIUM CHLORIDE 0.9 % IV BOLUS
1000.0000 mL | Freq: Once | INTRAVENOUS | Status: AC
  Administered 2017-12-25: 1000 mL via INTRAVENOUS

## 2017-12-25 MED ORDER — ONDANSETRON 4 MG PO TBDP: 4.0000 mg | ORAL_TABLET | Freq: Three times a day (TID) | ORAL | 0 refills | Status: DC | PRN

## 2017-12-25 MED ORDER — IOHEXOL 300 MG/ML  SOLN
100.0000 mL | Freq: Once | INTRAMUSCULAR | Status: AC | PRN
  Administered 2017-12-25: 100 mL via INTRAVENOUS

## 2017-12-25 NOTE — ED Provider Notes (Signed)
[facility] EMERGENCY DEPARTMENT Provider Note   CSN: [id number] Arrival date & time: 12/24/17  2352     History   Chief Complaint Chief Complaint  Patient presents with  . Abdominal Pain    HPI Vanessa Byrd is a 42 y.o. female.  Patient presents to the emergency department with a chief complaint of abdominal pain.  She reports right-sided abdominal pain that radiates to her back for the past several weeks.  She states that it has been gradually worsening.  She reports associated nausea and vomiting.  She had an ultrasound of her right upper quadrant performed about 3 days ago which was negative for gallstones.  She denies any fevers or chills.  Denies any cough, shortness of breath, chest pain.  She denies any dysuria, hematuria, or vaginal discharge.  She states that she has had unintentional weight loss.  She reports that she feels very anxious.  She was recently started on Prozac by her PCP as well as on tramadol for the pain that she has been experiencing.  Additionally, patient states that she passed out in triage.  RN recorded blood pressure in the 60s, which came up significantly shortly thereafter.  Patient states that she still feels fatigued.  The history is provided by the patient. No language interpreter was used.    Past Medical History:  Diagnosis Date  . H. pylori infection   . Sinusitis     Patient Active Problem List   Diagnosis Date Noted  . Anxiety and depression 05/31/2013  . ALLERGIC RHINITIS 11/24/2009    Past Surgical History:  Procedure Laterality Date  . TONSILLECTOMY    . WISDOM TOOTH EXTRACTION       OB History   None      Home Medications    Prior to Admission medications   Medication Sig Start Date End Date Taking? Authorizing Provider  FLUoxetine (PROZAC) 10 MG tablet Take 1 tablet (10 mg total) by mouth daily. 12/18/17   Jearld Fenton, NP  fluticasone (FLONASE) 50 MCG/ACT nasal spray Place 2 sprays into both  nostrils daily. 09/17/17   Tasia Catchings, Amy V, PA-C  meloxicam (MOBIC) 7.5 MG tablet Take 1 tablet (7.5 mg total) by mouth daily. 09/17/17   Tasia Catchings, Amy V, PA-C  naproxen sodium (ALEVE) 220 MG tablet Aleve 220 mg tablet  Take 1 tablet every 12 hours by oral route.    [provider]  traMADol (ULTRAM) 50 MG tablet Take 1 tablet (50 mg total) by mouth 2 (two) times daily as needed for moderate pain. 12/12/17   Ria Bush, MD  Vitamin D, Ergocalciferol, (DRISDOL) 50000 units CAPS capsule Take 1 capsule (50,000 Units total) by mouth every 7 (seven) days. 09/21/17   Jearld Fenton, NP    Family History Family History  Problem Relation Age of Onset  . GER disease Mother   . Colon cancer Neg Hx     Social History Social History   Tobacco Use  . Smoking status: Never Smoker  . Smokeless tobacco: Never Used  Substance Use Topics  . Alcohol use: No  . Drug use: No     Allergies   Amoxicillin   Review of Systems Review of Systems  All other systems reviewed and are negative.    Physical Exam Updated Vital Signs BP (!) 86/54   Pulse (!) 58   Temp 98.1 F (36.7 C) (Oral)   Resp 11   Ht 5\' 3"  (1.6 m)   Wt  83.5 kg (184 lb)   LMP 09/25/2017   SpO2 99%   BMI 32.59 kg/m   Physical Exam  Constitutional: She is oriented to person, place, and time. She appears well-developed and well-nourished.  HENT:  Head: Normocephalic and atraumatic.  Eyes: Pupils are equal, round, and reactive to light. Conjunctivae and EOM are normal.  Neck: Normal range of motion. Neck supple.  Cardiovascular: Normal rate and regular rhythm. Exam reveals no gallop and no friction rub.  No murmur heard. Pulmonary/Chest: Effort normal and breath sounds normal. No respiratory distress. She has no wheezes. She has no rales. She exhibits no tenderness.  Abdominal: Soft. Bowel sounds are normal. She exhibits no distension and no mass. There is no tenderness. There is no rebound and no guarding.    Musculoskeletal: Normal range of motion. She exhibits no edema or tenderness.  Neurological: She is alert and oriented to person, place, and time.  Skin: Skin is warm and dry.  Psychiatric: She has a normal mood and affect. Her behavior is normal. Judgment and thought content normal.  Nursing note and vitals reviewed.    ED Treatments / Results  Labs (all labs ordered are listed, but only abnormal results are displayed) Labs Reviewed  CBC - Abnormal; Notable for the following components:      Result Value   WBC 11.1 (*)    All other components within normal limits  LIPASE, BLOOD  COMPREHENSIVE METABOLIC PANEL  URINALYSIS, ROUTINE W REFLEX MICROSCOPIC  I-STAT BETA HCG BLOOD, ED ([facility], [facility], [facility] ONLY)  CBG MONITORING, ED    EKG EKG Interpretation  Date/Time:  Monday December 25 2017 00:33:38 EDT Ventricular Rate:  58 PR Interval:  148 QRS Duration: 103 QT Interval:  453 QTC Calculation: 445 R Axis:   54 Text Interpretation:  Sinus rhythm RSR' in V1 or V2, right VCD or RVH Confirmed by Ripley Fraise [phone]) on 12/25/2017 12:38:36 AM   Radiology No results found.  Procedures Procedures (including critical care time)  Medications Ordered in ED Medications  ondansetron (ZOFRAN-ODT) disintegrating tablet 4 mg (has no administration in time range)  sodium chloride 0.9 % bolus 1,000 mL (has no administration in time range)     Initial Impression / Assessment and Plan / ED Course  I have reviewed the triage vital signs and the nursing notes.  Pertinent labs & imaging results that were available during my care of the patient were reviewed by me and considered in my medical decision making (see chart for details).     Patient with generalized abdominal pain and nausea and vomiting times several weeks.  She also feels very fatigued.  States that she passed out in triage.  Likely vasovagal secondary to having blood drawn.  Hemoglobin is normal.  Patient is not pregnant.  She  does have a mild leukocytosis.  Urinalysis is pending.  Recent ultrasound shows no gallstones.  She does have some pain that goes into her back, consider kidney stones.  CT shows evidence of colitis, but normal appendix, no renal stones.  Patient has had vomiting and diarrhea over the past several weeks.  Colitis is likely.  Given length of symptoms, will give antibiotic trial.  Final Clinical Impressions(s) / ED Diagnoses   Final diagnoses:  Colitis    ED Discharge Orders        Ordered    ondansetron (ZOFRAN ODT) 4 MG disintegrating tablet  Every 8 hours PRN     12/25/17 0405    ciprofloxacin (CIPRO) 500  MG tablet  2 times daily     12/25/17 0405    metroNIDAZOLE (FLAGYL) 500 MG tablet  2 times daily     12/25/17 0405       Montine Circle, PA-C 12/25/17 0444    Ripley Fraise, MD 12/25/17 [phone]

## 2017-12-25 NOTE — ED Notes (Signed)
After triage, pt c/o feeling faint; had a syncopal episode lasting 30seconds. Pressure 67/50; IV placed with fluid bolus hung.

## 2017-12-25 NOTE — ED Triage Notes (Signed)
Pt has had R side pain for the past 2 weeks; pain started in RUQ radiating to back; was seen by PCP and given tramadol for pain. Pt had an US done that was negative Today reports weakness and "feeling loopy." Pt has recently started taking prozac as well.

## 2017-12-25 NOTE — ED Notes (Signed)
Pt ambulated to BR w/ no asst. Showing NAD. RR even and unlabored.

## 2017-12-25 NOTE — Telephone Encounter (Signed)
noted 

## 2017-12-25 NOTE — Telephone Encounter (Signed)
There is a problem with [employer] server; receiving fax from [facility] instead of portal; faxed note from [facility] in [place] NP in box.

## 2017-12-27 ENCOUNTER — Telehealth: Payer: Self-pay | Admitting: Internal Medicine

## 2017-12-27 ENCOUNTER — Encounter: Payer: Self-pay | Admitting: Internal Medicine

## 2017-12-27 NOTE — Telephone Encounter (Signed)
Yes, it is fine to put her in 2 15 minute appts

## 2017-12-27 NOTE — Telephone Encounter (Signed)
See below crm Is it ok to work pt in.  Combining 2- 15 min appointments  Copied from [place]. Topic: Appointment Scheduling - Scheduling Inquiry for Clinic >> Dec 27, 2017  7:39 AM Lennox Solders wrote: Reason for CRM:pt needs post er fup for colitis with in a week per pt. Pt was seen at [facility] 4-28

## 2017-12-27 NOTE — Telephone Encounter (Signed)
Appointment 5/13 offered sooner appointment Pt refused due to work schedule

## 2018-01-03 ENCOUNTER — Ambulatory Visit: Payer: Self-pay | Admitting: *Deleted

## 2018-01-03 NOTE — Telephone Encounter (Signed)
Contacted pt. Stated she has nasal congestion and post nasal drip.  C/o some nausea from the post nasal drip.  Denied fever/ chills or any other symptoms.  Stated she called the pharmacist and was advised to take Flonase nasal spray.  Pt. Stated she has an appt. With Webb Silversmith on Mon., 01/08/18.  Declined triage questions at this time, and agreed to call back if symptoms worsen.

## 2018-01-08 ENCOUNTER — Ambulatory Visit (INDEPENDENT_AMBULATORY_CARE_PROVIDER_SITE_OTHER): Payer: No Typology Code available for payment source | Admitting: Internal Medicine

## 2018-01-08 ENCOUNTER — Encounter: Payer: Self-pay | Admitting: Internal Medicine

## 2018-01-08 VITALS — BP 116/72 | HR 81 | Temp 98.8°F | Wt 177.0 lb

## 2018-01-08 DIAGNOSIS — R1011 Right upper quadrant pain: Secondary | ICD-10-CM

## 2018-01-08 DIAGNOSIS — K529 Noninfective gastroenteritis and colitis, unspecified: Secondary | ICD-10-CM | POA: Diagnosis not present

## 2018-01-08 DIAGNOSIS — R221 Localized swelling, mass and lump, neck: Secondary | ICD-10-CM | POA: Diagnosis not present

## 2018-01-08 NOTE — Patient Instructions (Signed)
Colitis Colitis is inflammation of the colon. Colitis may last a short time (acute) or it may last a long time (chronic). What are the causes? This condition may be caused by:  Viruses.  Bacteria.  Reactions to medicine.  Certain autoimmune diseases, such as Crohn disease or ulcerative colitis.  What are the signs or symptoms? Symptoms of this condition include:  Diarrhea.  Passing bloody or tarry stool.  Pain.  Fever.  Vomiting.  Tiredness (fatigue).  Weight loss.  Bloating.  Sudden increase in abdominal pain.  Having fewer bowel movements than usual.  How is this diagnosed? This condition is diagnosed with a stool test or a blood test. You may also have other tests, including X-rays, a CT scan, or a colonoscopy. How is this treated? Treatment may include:  Resting the bowel. This involves not eating or drinking for a period of time.  Fluids that are given through an IV tube.  Medicine for pain and diarrhea.  Antibiotic medicines.  Cortisone medicines.  Surgery.  Follow these instructions at home: Eating and drinking  Follow instructions from your health care provider about eating or drinking restrictions.  Drink enough fluid to keep your urine clear or pale yellow.  Work with a dietitian to determine which foods cause your condition to flare up.  Avoid foods that cause flare-ups.  Eat a well-balanced diet. Medicines  Take over-the-counter and prescription medicines only as told by your health care provider.  If you were prescribed an antibiotic medicine, take it as told by your health care provider. Do not stop taking the antibiotic even if you start to feel better. General instructions  Keep all follow-up visits as told by your health care provider. This is important. Contact a health care provider if:  Your symptoms do not go away.  You develop new symptoms. Get help right away if:  You have a fever that does not go away with  treatment.  You develop chills.  You have extreme weakness, fainting, or dehydration.  You have repeated vomiting.  You develop severe pain in your abdomen.  You pass bloody or tarry stool. This information is not intended to replace advice given to you by your health care provider. Make sure you discuss any questions you have with your health care provider. Document Released: 09/22/2004 Document Revised: 01/21/2016 Document Reviewed: 12/08/2014 Elsevier Interactive Patient Education  2018 Elsevier Inc.  

## 2018-01-08 NOTE — Progress Notes (Signed)
Subjective:    Patient ID: Vanessa Byrd, female    DOB: 06/01/1976, 42 y.o.   MRN: [id number]  HPI  Pt presents to the clinic today for ER followup. She went to the ER 4/29 with c/o abdominal pain. She had been seen 4/16 for the same by Dr. Lucretia Roers. Urine pregnancy test was negative. Urinalysis was normal, labs were unremarkable. She was given Tramadol for pain. She was seen again 4/22 by me. Labs unremarkable. RUQ ultrasound was negative for gallstones. In the ER, WBC was slightly elevated. CT scan was concerning for colitis. She was treated with Cipro and Flagyl. She reports nausea, vomiting and loose stools have subsided. She continues to have RUQ pain, but it seems to be improving.   She also c/o a lump in her throat. She reports this comes and goes. She denies difficulty swallowing, or breathing. She denies getting choked on food or liquids. She does have some allergies, but is taking Flonase daily. She denies reflux. She has not tried anything OTC for her symptoms. She has had an upper GI in the past, which did not show any stricture.    Review of Systems  Past Medical History:  Diagnosis Date  . H. pylori infection   . Sinusitis     Current Outpatient Medications  Medication Sig Dispense Refill  . FLUoxetine (PROZAC) 10 MG tablet Take 1 tablet (10 mg total) by mouth daily. 30 tablet 2  . fluticasone (FLONASE) 50 MCG/ACT nasal spray Place 2 sprays into both nostrils daily. (Patient taking differently: Place 2 sprays into both nostrils daily as needed for allergies. ) 16 g 0  . naproxen sodium (ALEVE) 220 MG tablet Aleve 220 mg tablet  Take 1 tablet every 12 hours as needed for pain.    Marland Kitchen ondansetron (ZOFRAN ODT) 4 MG disintegrating tablet Take 1 tablet (4 mg total) by mouth every 8 (eight) hours as needed for nausea or vomiting. 10 tablet 0   No current facility-administered medications for this visit.     Allergies  Allergen Reactions  . Amoxicillin     REACTION:  swelling. angioedema    Family History  Problem Relation Age of Onset  . GER disease Mother   . Colon cancer Neg Hx     Social History   Socioeconomic History  . Marital status: Married    Spouse name: Not on file  . Number of children: Not on file  . Years of education: Not on file  . Highest education level: Not on file  Occupational History  . Not on file  Social Needs  . Financial resource strain: Not on file  . Food insecurity:    Worry: Not on file    Inability: Not on file  . Transportation needs:    Medical: Not on file    Non-medical: Not on file  Tobacco Use  . Smoking status: Never Smoker  . Smokeless tobacco: Never Used  Substance and Sexual Activity  . Alcohol use: No  . Drug use: No  . Sexual activity: Not on file  Lifestyle  . Physical activity:    Days per week: Not on file    Minutes per session: Not on file  . Stress: Not on file  Relationships  . Social connections:    Talks on phone: Not on file    Gets together: Not on file    Attends religious service: Not on file    Active member of club or organization: Not on file  Attends meetings of clubs or organizations: Not on file    Relationship status: Not on file  . Intimate partner violence:    Fear of current or ex partner: Not on file    Emotionally abused: Not on file    Physically abused: Not on file    Forced sexual activity: Not on file  Other Topics Concern  . Not on file  Social History Narrative  . Not on file     Constitutional: Denies fever, malaise, fatigue, headache or abrupt weight changes.  HEENT: Pt reports lump in throat. Denies eye pain, eye redness, ear pain, ringing in the ears, wax buildup, runny nose, nasal congestion, bloody nose, or sore throat. Respiratory: Denies difficulty breathing, shortness of breath, cough or sputum production.   Cardiovascular: Denies chest pain, chest tightness, palpitations or swelling in the hands or feet.  Gastrointestinal: Pt  reports RUQ pain. Denies abdominal pain, bloating, constipation, diarrhea or blood in the stool.  GU: Denies urgency, frequency, pain with urination, burning sensation, blood in urine, odor or discharge.  No other specific complaints in a complete review of systems (except as listed in HPI above).     Objective:   Physical Exam  BP 116/72   Pulse 81   Temp 98.8 F (37.1 C) (Oral)   Wt 177 lb (80.3 kg)   SpO2 98%   BMI 31.35 kg/m  Wt Readings from Last 3 Encounters:  01/08/18 177 lb (80.3 kg)  12/25/17 184 lb (83.5 kg)  12/18/17 184 lb 4 oz (83.6 kg)    General: Appears her stated age, well developed, well nourished in NAD. HEENT: HThroat/Mouth: Teeth present, mucosa pink and moist, + PND, no exudate, lesions or ulcerations noted.  Neck:  Neck supple, trachea midline. No masses, lumps or thyromegaly present.  Cardiovascular: Normal rate and rhythm. S1,S2 noted.  No murmur, rubs or gallops noted.  Pulmonary/Chest: Normal effort and positive vesicular breath sounds. No respiratory distress. No wheezes, rales or ronchi noted.  Abdomen: Soft and mildly tender in the RLQ. Normal bowel sounds. No distention or masses noted.  Neurological: Alert and oriented.  BMET    Component Value Date/Time   NA 138 12/25/2017 0025   K 3.4 (L) 12/25/2017 0025   CL 102 12/25/2017 0025   CO2 27 12/25/2017 0025   GLUCOSE 109 (H) 12/25/2017 0025   BUN 6 12/25/2017 0025   CREATININE 0.74 12/25/2017 0025   CALCIUM 9.2 12/25/2017 0025   GFRNONAA >60 12/25/2017 0025   GFRAA >60 12/25/2017 0025    Lipid Panel  No results found for: CHOL, TRIG, HDL, CHOLHDL, VLDL, LDLCALC  CBC    Component Value Date/Time   WBC 11.1 (H) 12/25/2017 0025   RBC 4.85 12/25/2017 0025   HGB 14.4 12/25/2017 0025   HCT 40.7 12/25/2017 0025   PLT 226 12/25/2017 0025   MCV 83.9 12/25/2017 0025   MCH 29.7 12/25/2017 0025   MCHC 35.4 12/25/2017 0025   RDW 13.2 12/25/2017 0025   LYMPHSABS 2.7 12/12/2017 1204    MONOABS 0.3 12/12/2017 1204   EOSABS 0.1 12/12/2017 1204   BASOSABS 0.0 12/12/2017 1204    Hgb A1C No results found for: HGBA1C          Assessment & Plan:   ER Follow Up for RUQ Pain, Colitis:  ER notes, labs and imaging reviewed Symptoms improving If reoccurs, will send to GI for possible colonoscopy  Lump in Throat:  Start Zyrtec daily x 2 weeks If no improvement,  consider trial of Omeprazole  Return precautions discussed Webb Silversmith, NP

## 2018-01-25 ENCOUNTER — Ambulatory Visit (INDEPENDENT_AMBULATORY_CARE_PROVIDER_SITE_OTHER): Payer: No Typology Code available for payment source | Admitting: Internal Medicine

## 2018-01-25 ENCOUNTER — Encounter: Payer: Self-pay | Admitting: Internal Medicine

## 2018-01-25 VITALS — BP 118/68 | HR 69 | Temp 98.6°F | Wt 176.0 lb

## 2018-01-25 DIAGNOSIS — H66002 Acute suppurative otitis media without spontaneous rupture of ear drum, left ear: Secondary | ICD-10-CM

## 2018-01-25 MED ORDER — AZITHROMYCIN 250 MG PO TABS: ORAL_TABLET | ORAL | 0 refills | Status: DC

## 2018-01-25 MED ORDER — AZITHROMYCIN 250 MG PO TABS
ORAL_TABLET | ORAL | 0 refills | Status: DC
Start: 2018-01-25 — End: 2018-01-25

## 2018-01-25 MED FILL — AZITHROMYCIN 250 MG TABLET: 250 | 5 days supply | Qty: 6 | Fill #0

## 2018-01-25 NOTE — Progress Notes (Signed)
HPI  Pt presents to the clinic today with c/o nasal congestion, sore throat, ear pain and cough. She reports this started 2 days ago. She describes the ear pain as sharp, stabbing and full. The left is worse than the right. She is not blowing anything out of her nose. She denies difficulty swallowing. The cough is non productive. She denies fever, chills or body aches. She has tried Copywriter, advertising, Flonase and Robitussin with minimal relief. She has not had sick contacts that she is aware of.  Review of Systems      Past Medical History:  Diagnosis Date  . H. pylori infection   . Sinusitis     Family History  Problem Relation Age of Onset  . GER disease Mother   . Colon cancer Neg Hx     Social History   Socioeconomic History  . Marital status: Married    Spouse name: Not on file  . Number of children: Not on file  . Years of education: Not on file  . Highest education level: Not on file  Occupational History  . Not on file  Social Needs  . Financial resource strain: Not on file  . Food insecurity:    Worry: Not on file    Inability: Not on file  . Transportation needs:    Medical: Not on file    Non-medical: Not on file  Tobacco Use  . Smoking status: Never Smoker  . Smokeless tobacco: Never Used  Substance and Sexual Activity  . Alcohol use: No  . Drug use: No  . Sexual activity: Not on file  Lifestyle  . Physical activity:    Days per week: Not on file    Minutes per session: Not on file  . Stress: Not on file  Relationships  . Social connections:    Talks on phone: Not on file    Gets together: Not on file    Attends religious service: Not on file    Active member of club or organization: Not on file    Attends meetings of clubs or organizations: Not on file    Relationship status: Not on file  . Intimate partner violence:    Fear of current or ex partner: Not on file    Emotionally abused: Not on file    Physically abused: Not on file    Forced sexual  activity: Not on file  Other Topics Concern  . Not on file  Social History Narrative  . Not on file    Allergies  Allergen Reactions  . Amoxicillin     REACTION: swelling. angioedema     Constitutional: Denies headache, fatigue, fever or abrupt weight changes.  HEENT:  Positive ear pain, nasal congestion, sore throat. Denies eye redness, eye pain, pressure behind the eyes, facial pain, ringing in the ears, wax buildup, runny nose or bloody nose. Respiratory: Positive cough. Denies difficulty breathing or shortness of breath.  Cardiovascular: Denies chest pain, chest tightness, palpitations or swelling in the hands or feet.   No other specific complaints in a complete review of systems (except as listed in HPI above).  Objective:   BP 118/68   Pulse 69   Temp 98.6 F (37 C) (Oral)   Wt 176 lb (79.8 kg)   LMP 08/31/2017 Comment: irregular  SpO2 98%   BMI 31.18 kg/m   Wt Readings from Last 3 Encounters:  01/25/18 176 lb (79.8 kg)  01/08/18 177 lb (80.3 kg)  12/25/17 184 lb (83.5  kg)     General: Appears her stated age, in NAD. HEENT: Head: normal shape and size, no sinus tenderness noted; Right Ear: Tm's gray and intact, normal light reflex, + serous effusion; Left Ear: TM red, retracted, distorted light reflex; Nose: mucosa pink and moist, septum midline; Throat/Mouth: + PND. Teeth present, mucosa erythematous and moist, no exudate noted, no lesions or ulcerations noted.  Neck: No cervical lymphadenopathy.  Pulmonary/Chest: Normal effort and positive vesicular breath sounds. No respiratory distress. No wheezes, rales or ronchi noted.       Assessment & Plan:   Left Otitis Media:  Get some rest and drink plenty of water Do salt water gargles for the sore throat eRx for Azithromycin x 5 days Ibuprofen as needed  RTC as needed or if symptoms persist.   Webb Silversmith, NP

## 2018-01-25 NOTE — Patient Instructions (Signed)
Otitis Media, Adult Otitis media is redness, soreness, and puffiness (swelling) in the space just behind your eardrum (middle ear). It may be caused by allergies or infection. It often happens along with a cold. Follow these instructions at home:  Take your medicine as told. Finish it even if you start to feel better.  Only take over-the-counter or prescription medicines for pain, discomfort, or fever as told by your doctor.  Follow up with your doctor as told. Contact a doctor if:  You have otitis media only in one ear, or bleeding from your nose, or both.  You notice a lump on your neck.  You are not getting better in 3-5 days.  You feel worse instead of better. Get help right away if:  You have pain that is not helped with medicine.  You have puffiness, redness, or pain around your ear.  You get a stiff neck.  You cannot move part of your face (paralysis).  You notice that the bone behind your ear hurts when you touch it. This information is not intended to replace advice given to you by your health care provider. Make sure you discuss any questions you have with your health care provider. Document Released: 02/01/2008 Document Revised: 01/21/2016 Document Reviewed: 03/12/2013 Elsevier Interactive Patient Education  2017 Elsevier Inc.  

## 2018-01-26 ENCOUNTER — Encounter: Payer: Self-pay | Admitting: Internal Medicine

## 2018-01-29 ENCOUNTER — Encounter: Payer: Self-pay | Admitting: Family Medicine

## 2018-01-29 ENCOUNTER — Ambulatory Visit: Payer: Self-pay | Admitting: *Deleted

## 2018-01-29 ENCOUNTER — Ambulatory Visit (INDEPENDENT_AMBULATORY_CARE_PROVIDER_SITE_OTHER): Payer: No Typology Code available for payment source | Admitting: Family Medicine

## 2018-01-29 VITALS — BP 102/76 | HR 80 | Temp 98.3°F | Ht 63.0 in | Wt 174.2 lb

## 2018-01-29 DIAGNOSIS — H66002 Acute suppurative otitis media without spontaneous rupture of ear drum, left ear: Secondary | ICD-10-CM

## 2018-01-29 NOTE — Progress Notes (Signed)
   Subjective:    Patient ID: Vanessa Byrd, female    DOB: Dec 05, 1975, 42 y.o.   MRN: [id number]  HPI This is a 42 yo female who presents today with ear pain x 5 days. Was seen 02/25/18 and treated for left otitis media with Zpack. She now has bad cough, bilateral ears feel pressure, some throbbing. Little production to cough. No headache. No SOB or wheeze, no fever.  Currently not taking any meds for symptoms. Stopped all symptomatic treatment when she started azithromycin.  She was not sure if she could take over-the-counter's with her antibiotic.  Past Medical History:  Diagnosis Date  . H. pylori infection   . Sinusitis    Past Surgical History:  Procedure Laterality Date  . TONSILLECTOMY    . WISDOM TOOTH EXTRACTION     Family History  Problem Relation Age of Onset  . GER disease Mother   . Colon cancer Neg Hx    Social History   Tobacco Use  . Smoking status: Never Smoker  . Smokeless tobacco: Never Used  Substance Use Topics  . Alcohol use: No  . Drug use: No      Review of Systems Per HPI    Objective:   Physical Exam  Constitutional: She is oriented to person, place, and time. She appears well-developed and well-nourished. No distress.  HENT:  Head: Normocephalic and atraumatic.  Right Ear: Tympanic membrane, external ear and ear canal normal.  Left Ear: External ear and ear canal normal. Tympanic membrane is injected and bulging.  Nose: Nose normal.  Mouth/Throat: Oropharynx is clear and moist.  Eyes: Conjunctivae are normal.  Neck: Normal range of motion. Neck supple.  Cardiovascular: Normal rate, regular rhythm and normal heart sounds.  Pulmonary/Chest: Effort normal and breath sounds normal.  Lymphadenopathy:    She has no cervical adenopathy.  Neurological: She is alert and oriented to person, place, and time.  Skin: Skin is warm and dry. She is not diaphoretic.  Psychiatric: She has a normal mood and affect. Her behavior is normal. Judgment  and thought content normal.  Vitals reviewed.     BP 102/76 (BP Location: Right Arm, Patient Position: Sitting, Cuff Size: Normal)   Pulse 80   Temp 98.3 F (36.8 C) (Oral)   Ht 5\' 3"  (1.6 m)   Wt 174 lb 4 oz (79 kg)   LMP 08/29/2017   SpO2 97%   BMI 30.87 kg/m      Assessment & Plan:  1. Non-recurrent acute suppurative otitis media of left ear without spontaneous rupture of tympanic membrane - Provided written and verbal information regarding diagnosis and treatment. Elizebeth Koller Zpack Patient Instructions  For nasal congestion you can use Afrin nasal spray for 3 days max, Sudafed, saline nasal spray (generic is fine for all). For cough you can try Delsym. Drink enough fluids to make your urine light yellow. For fever/chill/muscle aches you can take over the counter acetaminophen or ibuprofen. Alleve 2 tablets every 12 hours.  Take flonase daily x 2-3 weeks Please come back in if you are not better in 5-7 days or if you develop wheezing, shortness of breath or persistent vomiting.       Clarene Reamer, FNP-BC  [facility]  01/30/2018 12:41 PM

## 2018-01-29 NOTE — Patient Instructions (Signed)
For nasal congestion you can use Afrin nasal spray for 3 days max, Sudafed, saline nasal spray (generic is fine for all). For cough you can try Delsym. Drink enough fluids to make your urine light yellow. For fever/chill/muscle aches you can take over the counter acetaminophen or ibuprofen. Alleve 2 tablets every 12 hours.  Take flonase daily x 2-3 weeks Please come back in if you are not better in 5-7 days or if you develop wheezing, shortness of breath or persistent vomiting.

## 2018-01-29 NOTE — Telephone Encounter (Signed)
Pt states that she saw Webb Silversmith either Wednesday or Thursday of next week and is still having trouble with ear infection and she has completed her antibiotics and would like to know what can be done next with out being seen again   Call to patient- patient has taken her antibiotics and her symptoms are returning. Appointment for reevaluation made.  Reason for Disposition . Ear congestion present > 48 hours  Answer Assessment - Initial Assessment Questions 1. LOCATION: "Which ear is involved?"       Both ears 2. SENSATION: "Describe how the ear feels."      Clogged up- muffled-buzzing 3. ONSET:  "When did the ear symptoms start?"       Seems to have improved some- but now getting worse again 4. PAIN: "Do you also have an earache?" If so, ask: "How bad is it?" (Scale 1-10; or mild, moderate, severe)     Slight throbbing 5. CAUSE: "What do you think is causing the ear congestion?"     Patient was recently treated for ear infection 6. URI: "Do you have a runny nose or cough?"      Congestion in chest 7. NASAL ALLERGIES: "Are there symptoms of hay fever, such as sneezing or a clear nasal discharge?"     no 8. PREGNANCY: "Is there any chance you are pregnant?" "When was your last menstrual period?"     No- LMP- 08/2017  Protocols used: EAR - CONGESTION-A-AH

## 2018-01-30 ENCOUNTER — Telehealth: Payer: Self-pay | Admitting: Internal Medicine

## 2018-01-30 ENCOUNTER — Other Ambulatory Visit: Payer: Self-pay

## 2018-01-30 ENCOUNTER — Encounter: Payer: Self-pay | Admitting: Family Medicine

## 2018-01-30 NOTE — Telephone Encounter (Signed)
She should be re-evaluated 

## 2018-01-30 NOTE — Telephone Encounter (Signed)
[employer] faxed fmla paperwork Spoke with pt she stated this was for colitis In [facility]ina[facility] in box

## 2018-01-31 NOTE — Telephone Encounter (Signed)
We can try a pred taper to see if that would help. Let me know if she is agreeable.

## 2018-01-31 NOTE — Telephone Encounter (Signed)
I spoke to pt and she reports she has been doing everything Tor Netters told her... Afrin, nasal saling and Flonase... She has had some improvement but still lingering... Please advise

## 2018-01-31 NOTE — Telephone Encounter (Signed)
Left detailed msg on VM per HIPAA  

## 2018-02-01 ENCOUNTER — Telehealth: Payer: Self-pay | Admitting: Internal Medicine

## 2018-02-01 MED ORDER — PREDNISONE 10 MG PO TABS: ORAL_TABLET | ORAL | 0 refills | Status: DC

## 2018-02-01 MED FILL — predniSONE 10 MG TABS: 10 | 6 days supply | Qty: 21 | Fill #0

## 2018-02-01 NOTE — Telephone Encounter (Signed)
>>   Feb 01, 2018  8:14 AM Conception Chancy, NT wrote: CRM for notification. See Telephone encounter for: 02/01/18.  Patient is returning [place] phone call and states she does want to try the prednisone taper.   [place], [place] - [address] [facility]. [address] [place] [address] Phone: [phone] Fax: [phone]

## 2018-02-01 NOTE — Telephone Encounter (Signed)
pred taper sent to pharmacy

## 2018-02-01 NOTE — Addendum Note (Signed)
Addended by: Jearld Fenton on: 02/01/2018 08:46 AM   Modules accepted: Orders

## 2018-02-01 NOTE — Telephone Encounter (Signed)
Copied from [place] [id number]. Topic: Quick Communication - See Telephone Encounter >> Feb 01, 2018  8:14 AM Conception Chancy, NT wrote: CRM for notification. See Telephone encounter for: 02/01/18.  Patient is returning [place] phone call and states she does want to try the prednisone taper.   [place], [place] - [address] [facility]. [address] [place] [address] Phone: [phone] Fax: [phone]

## 2018-02-02 DIAGNOSIS — Z0279 Encounter for issue of other medical certificate: Secondary | ICD-10-CM

## 2018-02-02 NOTE — Telephone Encounter (Signed)
Paperwork faxed °

## 2018-02-02 NOTE — Telephone Encounter (Signed)
Done, given back to Vanessa Byrd 

## 2018-02-05 NOTE — Telephone Encounter (Signed)
Pt aware paperwork has been faxed °Copy for pt °Copy for scan °Copy for billing °

## 2018-02-07 ENCOUNTER — Encounter: Payer: Self-pay | Admitting: Internal Medicine

## 2018-02-07 ENCOUNTER — Encounter (INDEPENDENT_AMBULATORY_CARE_PROVIDER_SITE_OTHER): Payer: Self-pay

## 2018-02-07 DIAGNOSIS — H9203 Otalgia, bilateral: Secondary | ICD-10-CM

## 2018-02-13 MED FILL — FLUTICASONE PROP 50 MCG SPR: 50 | 30 days supply | Qty: 16 | Fill #0

## 2018-05-01 ENCOUNTER — Encounter: Payer: Self-pay | Admitting: Gastroenterology

## 2018-05-01 ENCOUNTER — Ambulatory Visit (INDEPENDENT_AMBULATORY_CARE_PROVIDER_SITE_OTHER): Payer: No Typology Code available for payment source | Admitting: Internal Medicine

## 2018-05-01 ENCOUNTER — Encounter: Payer: Self-pay | Admitting: Internal Medicine

## 2018-05-01 VITALS — BP 114/78 | HR 73 | Temp 98.2°F | Wt 185.0 lb

## 2018-05-01 DIAGNOSIS — R61 Generalized hyperhidrosis: Secondary | ICD-10-CM

## 2018-05-01 DIAGNOSIS — R1310 Dysphagia, unspecified: Secondary | ICD-10-CM

## 2018-05-01 DIAGNOSIS — R09A2 Foreign body sensation, throat: Secondary | ICD-10-CM

## 2018-05-01 DIAGNOSIS — R0989 Other specified symptoms and signs involving the circulatory and respiratory systems: Secondary | ICD-10-CM

## 2018-05-01 DIAGNOSIS — M2550 Pain in unspecified joint: Secondary | ICD-10-CM | POA: Diagnosis not present

## 2018-05-01 DIAGNOSIS — L659 Nonscarring hair loss, unspecified: Secondary | ICD-10-CM

## 2018-05-01 DIAGNOSIS — F458 Other somatoform disorders: Secondary | ICD-10-CM

## 2018-05-01 LAB — TSH: TSH: 1.29 u[IU]/mL (ref 0.35–4.50)

## 2018-05-01 LAB — VITAMIN B12: Vitamin B-12: 339 pg/mL (ref 211–911)

## 2018-05-01 LAB — SEDIMENTATION RATE: SED RATE: 9 mm/h (ref 0–20)

## 2018-05-01 LAB — LUTEINIZING HORMONE: LH: 32.13 m[IU]/mL

## 2018-05-01 LAB — VITAMIN D 25 HYDROXY (VIT D DEFICIENCY, FRACTURES): VITD: 31.92 ng/mL (ref 30.00–100.00)

## 2018-05-01 LAB — FOLLICLE STIMULATING HORMONE: FSH: 82.4 m[IU]/mL

## 2018-05-01 NOTE — Patient Instructions (Signed)
Globus Pharyngeus Globus pharyngeus is a condition that makes it feel like you have a lump in your throat. It may also feel like you have something stuck in the front of your throat. This feeling may come and go. It is not painful, and it does not make it harder to swallow food or liquid. Globus pharyngeus does not cause changes that a health care provider can see during a physical exam. This condition usually goes away without treatment. What are the causes? Often, no cause can be found. The most common cause of globus pharyngeus is a condition that causes stomach juices to flow back up into the throat (gastroesophageal reflux). Other possible causes include:  Overstimulation of nerves that control swallowing.  Irritation of nerves that control swallowing (neuralgia).  An enlarged gland in the lower neck (thyroid gland).  Growth of tonsil tissue at the base of the tongue (lingual tonsil).  Anxiety.  Depression.  What are the signs or symptoms? The main symptom of this condition is a feeling of a lump in your throat. This feeling usually comes and goes. How is this diagnosed? This condition may be diagnosed after other conditions have been ruled out. You may have tests, such as:  A swallow study.  Ear, nose, and throat evaluation.  An exam of your throat using a thin, flexible tube with a light and camera on the end (endoscopy).  How is this treated? This condition may go away on its own, without treatment. In some cases, antidepressant medicines may be helpful. Follow these instructions at home:  Follow instructions from your health care provider about eating or drinking restrictions.  Take over-the-counter and prescription medicines only as told by your health care provider.  Keep all follow-up visits as told by your health care provider. This is important.  Follow instructions from your health care provider about home care for gastroesophageal reflux. Your health care  provider may recommend that you: ? Do not eat or drink anything that causes heartburn. ? Do not eat heavy meals close to bedtime. ? Do not drink caffeine. ? Do not drink alcohol. ? Raise the head of your bed. ? Sleep on your left side. Contact a health care provider if:  Your symptoms get worse.  You have throat pain.  You have trouble swallowing.  Food or liquid comes back up into your mouth.  You lose weight without trying. Get help right away if:  You develop swelling in your throat. Summary  Globus pharyngeus is a condition that makes it feel like you have a lump in your throat.  This condition usually goes away without treatment. This information is not intended to replace advice given to you by your health care provider. Make sure you discuss any questions you have with your health care provider. Document Released: 04/20/2016 Document Revised: 04/20/2016 Document Reviewed: 04/20/2016 Elsevier Interactive Patient Education  2018 Elsevier Inc.  

## 2018-05-01 NOTE — Progress Notes (Signed)
Subjective:    Patient ID: Vanessa Byrd, female    DOB: July 10, 1976, 42 y.o.   MRN: [id number]  HPI  Pt presents to the clinic today with multiple complaints.  1- She reports she is having hot flashes and night sweats. This started a few months ago. It is so bad she is having to change her clothes and sheets in the middle of the night. Her LMP was 08/2017. She is not taking any Black Cohosh or other supplements for this OTC.  2- She reports her hair is thinning. She denies excessive shedding or bald spots. She has not changes her shampoo/conditioner or other hair products. She has not taken anything OTC for this.   3- She also reports multiple joint pains. This started a few months ago but seems to be getting worse. She describes the pain as stiffness, mainly in her hands, ankles and knees. She denies any joint swelling. She has tried Ibuprofen as needed with minimal relief. She reports her grandmother has RA.  4-She also reports difficulty swallowing. She reports the started after her upper GI 10/2013 but seems to be getting worse. She can feel food slowly passing in that part of her esophagus. She has not actually choked on food or liquids. She denies reflux. She has not taken anything OTC for this.  5- She would like a refill of her Prozac today. She reports this is working well for her anxiety and depression.  Review of Systems  Past Medical History:  Diagnosis Date  . H. pylori infection   . Sinusitis     Current Outpatient Medications  Medication Sig Dispense Refill  . FLUoxetine (PROZAC) 10 MG tablet Take 1 tablet (10 mg total) by mouth daily. 30 tablet 2  . fluticasone (FLONASE) 50 MCG/ACT nasal spray Place 2 sprays into both nostrils daily. (Patient taking differently: Place 2 sprays into both nostrils daily as needed for allergies. ) 16 g 0  . naproxen sodium (ALEVE) 220 MG tablet Aleve 220 mg tablet  Take 1 tablet every 12 hours as needed for pain.    Marland Kitchen ondansetron  (ZOFRAN ODT) 4 MG disintegrating tablet Take 1 tablet (4 mg total) by mouth every 8 (eight) hours as needed for nausea or vomiting. 10 tablet 0  . predniSONE (DELTASONE) 10 MG tablet Take 6 tabs day 1, 5 tabs day 2, 4 tabs day 3, 3 tabs day 4, 2 tabs day 5, 1 tab day 6 21 tablet 0   No current facility-administered medications for this visit.     Allergies  Allergen Reactions  . Amoxicillin     REACTION: swelling. angioedema    Family History  Problem Relation Age of Onset  . GER disease Mother   . Colon cancer Neg Hx     Social History   Socioeconomic History  . Marital status: Married    Spouse name: Not on file  . Number of children: Not on file  . Years of education: Not on file  . Highest education level: Not on file  Occupational History  . Not on file  Social Needs  . Financial resource strain: Not on file  . Food insecurity:    Worry: Not on file    Inability: Not on file  . Transportation needs:    Medical: Not on file    Non-medical: Not on file  Tobacco Use  . Smoking status: Never Smoker  . Smokeless tobacco: Never Used  Substance and Sexual Activity  . Alcohol  use: No  . Drug use: No  . Sexual activity: Not on file  Lifestyle  . Physical activity:    Days per week: Not on file    Minutes per session: Not on file  . Stress: Not on file  Relationships  . Social connections:    Talks on phone: Not on file    Gets together: Not on file    Attends religious service: Not on file    Active member of club or organization: Not on file    Attends meetings of clubs or organizations: Not on file    Relationship status: Not on file  . Intimate partner violence:    Fear of current or ex partner: Not on file    Emotionally abused: Not on file    Physically abused: Not on file    Forced sexual activity: Not on file  Other Topics Concern  . Not on file  Social History Narrative  . Not on file     Constitutional: Denies fever, malaise, fatigue, headache  or abrupt weight changes.  HEENT: Denies eye pain, eye redness, ear pain, ringing in the ears, wax buildup, runny nose, nasal congestion, bloody nose, or sore throat. Respiratory: Denies difficulty breathing, shortness of breath, cough or sputum production.   Cardiovascular: Denies chest pain, chest tightness, palpitations or swelling in the hands or feet.  Gastrointestinal: Pt reports difficulty swallowing. Denies abdominal pain, bloating, constipation, diarrhea or blood in the stool.  GU: Denies urgency, frequency, pain with urination, burning sensation, blood in urine, odor or discharge. Musculoskeletal: Pt reports multiple joint pains. Denies decrease in range of motion, difficulty with gait, muscle pain or joint swelling.  Skin: Pt reports thinning hair. Denies redness, rashes, lesions or ulcercations.  Neurological: Pt reports night sweats. Denies dizziness, difficulty with memory, difficulty with speech or problems with balance and coordination.  Psych: Pt has a history of anxiety and depression. Denies anxiety, depression, SI/HI.  No other specific complaints in a complete review of systems (except as listed in HPI above).     Objective:   Physical Exam   BP 114/78   Pulse 73   Temp 98.2 F (36.8 C) (Oral)   Wt 185 lb (83.9 kg)   LMP 09/22/2017   SpO2 98%   BMI 32.77 kg/m  Wt Readings from Last 3 Encounters:  05/01/18 185 lb (83.9 kg)  01/29/18 174 lb 4 oz (79 kg)  01/25/18 176 lb (79.8 kg)    General: Appears her stated age, well developed, well nourished in NAD. Skin: Warm, dry and intact. No bald spots noted of the scalp. Neck:  Neck supple, trachea midline. No masses, lumps or thyromegaly present.  Cardiovascular: Normal rate and rhythm. S1,S2 noted.  No murmur, rubs or gallops noted.  Pulmonary/Chest: Normal effort and positive vesicular breath sounds. No respiratory distress. No wheezes, rales or ronchi noted.  Abdomen: Soft and nontender. Normal bowel sounds. No  distention or masses noted.  Musculoskeletal: No signs of joint swelling. Strength 5/5 BUE/BLE. No difficulty with gait.  Neurological: Alert and oriented.  Psychiatric: Mood and affect normal. Behavior is normal. Judgment and thought content normal.     BMET    Component Value Date/Time   NA 138 12/25/2017 0025   K 3.4 (L) 12/25/2017 0025   CL 102 12/25/2017 0025   CO2 27 12/25/2017 0025   GLUCOSE 109 (H) 12/25/2017 0025   BUN 6 12/25/2017 0025   CREATININE 0.74 12/25/2017 0025   CALCIUM 9.2 12/25/2017  0025   GFRNONAA >60 12/25/2017 0025   GFRAA >60 12/25/2017 0025    Lipid Panel  No results found for: CHOL, TRIG, HDL, CHOLHDL, VLDL, LDLCALC  CBC    Component Value Date/Time   WBC 11.1 (H) 12/25/2017 0025   RBC 4.85 12/25/2017 0025   HGB 14.4 12/25/2017 0025   HCT 40.7 12/25/2017 0025   PLT 226 12/25/2017 0025   MCV 83.9 12/25/2017 0025   MCH 29.7 12/25/2017 0025   MCHC 35.4 12/25/2017 0025   RDW 13.2 12/25/2017 0025   LYMPHSABS 2.7 12/12/2017 1204   MONOABS 0.3 12/12/2017 1204   EOSABS 0.1 12/12/2017 1204   BASOSABS 0.0 12/12/2017 1204    Hgb A1C No results found for: HGBA1C         Assessment & Plan:   Hot Flashes and Night Sweats:  Likely perimenopausal Will check TSH, FSH/LH today Start Black Cohosh OTC  Anxiety and Depression:  Support offered today Prozac refilled  Dysphagia/Globus Sensation:  Start Prilosec OTC Referral placed to GI for further evaluation  Thinning Hair:  Will check TSH, Vit D and B12 today  Multiple Joint Pain:  Will check ANA, RF and ESR today Encouraged her to stay active Continue Ibuprofen OTC as needed- discussed overuse  Will follow up after labs, return precautions discussed Webb Silversmith, NP

## 2018-05-03 ENCOUNTER — Encounter: Payer: Self-pay | Admitting: Internal Medicine

## 2018-05-03 DIAGNOSIS — F32A Depression, unspecified: Secondary | ICD-10-CM

## 2018-05-03 DIAGNOSIS — F329 Major depressive disorder, single episode, unspecified: Secondary | ICD-10-CM

## 2018-05-03 DIAGNOSIS — F419 Anxiety disorder, unspecified: Principal | ICD-10-CM

## 2018-05-03 LAB — ANA: ANA: NEGATIVE

## 2018-05-03 LAB — RHEUMATOID FACTOR: Rhuematoid fact SerPl-aCnc: <14 IU/mL (ref <14)

## 2018-05-14 MED ORDER — FLUOXETINE HCL 10 MG PO TABS: 10.0000 mg | ORAL_TABLET | Freq: Every day | ORAL | 2 refills | Status: DC

## 2018-05-14 MED FILL — FLUoxetine HCL 10 MG TABS: 10 | 30 days supply | Qty: 30 | Fill #0

## 2018-05-15 ENCOUNTER — Ambulatory Visit (HOSPITAL_COMMUNITY)
Admission: EM | Admit: 2018-05-15 | Discharge: 2018-05-15 | Disposition: A | Discharge status: Home / Self Care | Payer: No Typology Code available for payment source | Attending: Family Medicine | Admitting: Family Medicine

## 2018-05-15 ENCOUNTER — Other Ambulatory Visit: Payer: Self-pay

## 2018-05-15 ENCOUNTER — Encounter (HOSPITAL_COMMUNITY): Payer: Self-pay | Admitting: Emergency Medicine

## 2018-05-15 DIAGNOSIS — R42 Dizziness and giddiness: Secondary | ICD-10-CM | POA: Diagnosis not present

## 2018-05-15 DIAGNOSIS — R0789 Other chest pain: Secondary | ICD-10-CM

## 2018-05-15 HISTORY — DX: Noninfective gastroenteritis and colitis, unspecified: K52.9

## 2018-05-15 MED ORDER — GI COCKTAIL ~~LOC~~
ORAL | Status: AC
  Filled 2018-05-15: qty 30

## 2018-05-15 MED ORDER — GI COCKTAIL ~~LOC~~
30.0000 mL | Freq: Once | ORAL | Status: AC
  Administered 2018-05-15: 30 mL via ORAL

## 2018-05-15 NOTE — Discharge Instructions (Signed)
Use anti-inflammatories for headache You may take up to 800 mg Ibuprofen every 8 hours with food. You may supplement Ibuprofen with Tylenol [phone] mg every 8 hours.   Please rest and relax for the rest of today  Please return or go to ED if pain returning, worsening, noticing it in relation to exertion, shortness of breath, nausea, dizziness, passing out

## 2018-05-15 NOTE — ED Triage Notes (Signed)
The patient presented to the [facility] with a complaint of chest pain and lightheadedness that started this am after taking her fluoxetine.

## 2018-05-15 NOTE — ED Provider Notes (Addendum)
[place]    CSN: [id number] Arrival date & time: 05/15/18  1129     History   Chief Complaint Chief Complaint  Patient presents with  . Chest Pain    HPI Vanessa Byrd is a 42 y.o. female history of anxiety presenting today for evaluation of chest discomfort and lightheadedness.  Patient states that this morning as she was heading into work she is stop by the pharmacy, she took 1 tablet of her Prozac without water.  20 minutes later she began to feel a discomfort in her chest and initially believed to be indigestion.  She took some Pepto-Bismol and later times, but her symptoms continue to progress and felt nauseous after attempting to eat.  The chest discomfort was described as a tightness in the center of her lower chest, and she was also having discomfort in her left jaw and left shoulder.  Since she has been here her discomfort has eased off, but she is also developed a headache mainly on the left side of her face.  She denies any vision changes.  Denies any difficulty speaking.  Denies any weakness.  Denies any leg pain or leg swelling.  Denies previous DVT/PE.  Patient has since developed a headache on the left side.  HPI  Past Medical History:  Diagnosis Date  . Colitis   . H. pylori infection   . Sinusitis     Patient Active Problem List   Diagnosis Date Noted  . Anxiety and depression 05/31/2013  . ALLERGIC RHINITIS 11/24/2009    Past Surgical History:  Procedure Laterality Date  . TONSILLECTOMY    . WISDOM TOOTH EXTRACTION      OB History   None      Home Medications    Prior to Admission medications   Medication Sig Start Date End Date Taking? Authorizing Provider  FLUoxetine (PROZAC) 10 MG tablet Take 1 tablet (10 mg total) by mouth daily. 05/14/18  Yes Baity, Coralie Keens, NP  fluticasone (FLONASE) 50 MCG/ACT nasal spray Place 2 sprays into both nostrils daily. Patient taking differently: Place 2 sprays into both nostrils daily as needed  for allergies.  09/17/17  Yes Ok Edwards, PA-C    Family History Family History  Problem Relation Age of Onset  . GER disease Mother   . Colon cancer Neg Hx     Social History Social History   Tobacco Use  . Smoking status: Never Smoker  . Smokeless tobacco: Never Used  Substance Use Topics  . Alcohol use: No  . Drug use: No     Allergies   Amoxicillin   Review of Systems Review of Systems  Constitutional: Negative for fatigue and fever.  HENT: Negative for congestion, sinus pressure and sore throat.   Eyes: Negative for photophobia, pain and visual disturbance.  Respiratory: Negative for cough and shortness of breath.   Cardiovascular: Positive for chest pain.  Gastrointestinal: Positive for nausea. Negative for abdominal pain and vomiting.  Genitourinary: Negative for decreased urine volume and hematuria.  Musculoskeletal: Negative for myalgias, neck pain and neck stiffness.  Neurological: Positive for headaches. Negative for dizziness, syncope, facial asymmetry, speech difficulty, weakness, light-headedness and numbness.     Physical Exam Triage Vital Signs ED Triage Vitals  Enc Vitals Group     BP 05/15/18 1143 (!) 152/82     Pulse Rate 05/15/18 1143 69     Resp 05/15/18 1143 18     Temp 05/15/18 1143 98.2 F (36.8 C)  Temp Source 05/15/18 1143 Oral     SpO2 05/15/18 1143 100 %     Weight --      Height --      Head Circumference --      Peak Flow --      Pain Score 05/15/18 1144 5     Pain Loc --      Pain Edu? --      Excl. in [place]? --    No data found.  Updated Vital Signs BP (!) 152/82 (BP Location: Left Arm)   Pulse 69   Temp 98.2 F (36.8 C) (Oral)   Resp 18   SpO2 100%  Blood pressure rechecked 128/78 Visual Acuity Right Eye Distance:   Left Eye Distance:   Bilateral Distance:    Right Eye Near:   Left Eye Near:    Bilateral Near:     Physical Exam  Constitutional: She is oriented to person, place, and time. She appears  well-developed and well-nourished. No distress.  HENT:  Head: Normocephalic and atraumatic.  Oral mucosa pink and moist, no tonsillar enlargement or exudate. Posterior pharynx patent and nonerythematous, no uvula deviation or swelling. Normal phonation.   Eyes: Pupils are equal, round, and reactive to light. Conjunctivae and EOM are normal.  Neck: Neck supple.  Cardiovascular: Normal rate and regular rhythm.  No murmur heard. Pulmonary/Chest: Effort normal and breath sounds normal. No respiratory distress.  Breathing comfortably at rest, CTABL, no wheezing, rales or other adventitious sounds auscultated  Mild tenderness to palpation to lower sternal area, nontender to palpation throughout the rest of anterior chest  Abdominal: Soft. There is no tenderness.  Musculoskeletal: She exhibits no edema.  Strength 5/5 and equal bilaterally at shoulders  No lower leg swelling or calf tenderness  Neurological: She is alert and oriented to person, place, and time.  Patient A&O x3, cranial nerves II-XII grossly intact, strength at shoulders, hips and knees 5/5, equal bilaterally, patellar reflex 2+ bilaterally.Gait without abnormality.  Skin: Skin is warm and dry.  Psychiatric: She has a normal mood and affect.  Normal thought process/mentation  Nursing note and vitals reviewed.    UC Treatments / Results  Labs (all labs ordered are listed, but only abnormal results are displayed) Labs Reviewed - No data to display  EKG None  Radiology No results found.  Procedures Procedures (including critical care time)  Medications Ordered in UC Medications  gi cocktail (Maalox,Lidocaine,Donnatal) (30 mLs Oral Given 05/15/18 1328)    Initial Impression / Assessment and Plan / UC Course  I have reviewed the triage vital signs and the nursing notes.  Pertinent labs & imaging results that were available during my care of the patient were reviewed by me and considered in my medical decision making  (see chart for details).     EKG normal sinus rhythm, no acute signs of ischemia or infarction.  GI cocktail provided, symptoms slightly relieved, symptoms largely improved her other today.  Negative risk factors.  PERC negative.  Possible anxiety versus GERD.  Given negative risk factors and normal EKG, ACS is less likely, but discussed with patient that this does not 100% rule out symptoms being cardiac related.  Through shared decision making with patient, opted to discharge home, treat headache with anti-inflammatories, will relax today and follow-up or return if symptoms returning, worsening, persisting or changing.  Discussed strict return precautions. Patient verbalized understanding and is agreeable with plan. Final Clinical Impressions(s) / UC Diagnoses   Final diagnoses:  Atypical chest pain     Discharge Instructions     Use anti-inflammatories for headache You may take up to 800 mg Ibuprofen every 8 hours with food. You may supplement Ibuprofen with Tylenol [phone] mg every 8 hours.   Please rest and relax for the rest of today  Please return or go to ED if pain returning, worsening, noticing it in relation to exertion, shortness of breath, nausea, dizziness, passing out    ED Prescriptions    None     Controlled Substance Prescriptions [place] Controlled Substance Registry consulted? Not Applicable   Janith Lima, PA-C 05/15/18 [address] [place] C, PA-C 05/15/18 1407    Janith Lima, [place] 05/15/18 1456

## 2018-06-13 ENCOUNTER — Ambulatory Visit (INDEPENDENT_AMBULATORY_CARE_PROVIDER_SITE_OTHER): Payer: No Typology Code available for payment source | Admitting: Gastroenterology

## 2018-06-13 ENCOUNTER — Encounter: Payer: Self-pay | Admitting: Gastroenterology

## 2018-06-13 ENCOUNTER — Other Ambulatory Visit (INDEPENDENT_AMBULATORY_CARE_PROVIDER_SITE_OTHER): Payer: No Typology Code available for payment source

## 2018-06-13 VITALS — BP 120/78 | HR 72 | Ht 63.0 in | Wt 182.0 lb

## 2018-06-13 DIAGNOSIS — R9389 Abnormal findings on diagnostic imaging of other specified body structures: Secondary | ICD-10-CM | POA: Diagnosis not present

## 2018-06-13 DIAGNOSIS — R195 Other fecal abnormalities: Secondary | ICD-10-CM | POA: Insufficient documentation

## 2018-06-13 DIAGNOSIS — K2100 Gastro-esophageal reflux disease with esophagitis, without bleeding: Secondary | ICD-10-CM

## 2018-06-13 DIAGNOSIS — R1310 Dysphagia, unspecified: Secondary | ICD-10-CM

## 2018-06-13 DIAGNOSIS — R109 Unspecified abdominal pain: Secondary | ICD-10-CM | POA: Insufficient documentation

## 2018-06-13 LAB — CBC WITH DIFFERENTIAL/PLATELET
BASOS ABS: 0 10*3/uL (ref 0.0–0.1)
Basophils Relative: 0.3 % (ref 0.0–3.0)
EOS ABS: 0.1 10*3/uL (ref 0.0–0.7)
Eosinophils Relative: 2 % (ref 0.0–5.0)
HCT: 43.5 % (ref 36.0–46.0)
HEMOGLOBIN: 14.7 g/dL (ref 12.0–15.0)
Lymphocytes Relative: 44.1 % (ref 12.0–46.0)
Lymphs Abs: 3 10*3/uL (ref 0.7–4.0)
MCHC: 33.9 g/dL (ref 30.0–36.0)
MCV: 84.2 fl (ref 78.0–100.0)
MONO ABS: 0.3 10*3/uL (ref 0.1–1.0)
Monocytes Relative: 4.9 % (ref 3.0–12.0)
Neutro Abs: 3.3 10*3/uL (ref 1.4–7.7)
Neutrophils Relative %: 48.7 % (ref 43.0–77.0)
Platelets: 275 10*3/uL (ref 150.0–400.0)
RBC: 5.16 Mil/uL — AB (ref 3.87–5.11)
RDW: 14 % (ref 11.5–15.5)
WBC: 6.8 10*3/uL (ref 4.0–10.5)

## 2018-06-13 LAB — IGA: IgA: 113 mg/dL (ref 68–378)

## 2018-06-13 LAB — IBC PANEL
IRON: 47 ug/dL (ref 42–145)
SATURATION RATIOS: 9.8 % — AB (ref 20.0–50.0)
Transferrin: 344 mg/dL (ref 212.0–360.0)

## 2018-06-13 LAB — FERRITIN: FERRITIN: 22.7 ng/mL (ref 10.0–291.0)

## 2018-06-13 MED ORDER — OMEPRAZOLE 40 MG PO CPDR: 40.0000 mg | DELAYED_RELEASE_CAPSULE | Freq: Two times a day (BID) | ORAL | 3 refills | Status: DC

## 2018-06-13 MED FILL — OMEPRAZOLE 40 MG CPDR: 40 | 30 days supply | Qty: 60 | Fill #0

## 2018-06-13 NOTE — Progress Notes (Signed)
[facility] VISIT   Primary Care Provider [place], Coralie Keens, NP [address] [place] [address] [phone]  Referring Provider Jearld Fenton, NP [address] [place], [place] [address] [phone]  Patient Profile: Vanessa Byrd is a 42 y.o. female with a pmh significant for prior Anxiety/MDD, prior Colitis NOS.  The patient presents to the [facility] for an evaluation and management of problem(s) noted below:  Problem List 1. Abdominal pain, unspecified abdominal location   2. Abnormal CT scan   3. Abnormal swallowing   4. Gastroesophageal reflux disease with esophagitis     History of Present Illness: This is the patient's first visit to the [facility] in years.  She has been seen for an endoscopy years ago for evaluation of heartburn symptoms.  She was found to have non-erosive esophagitis.  April 2019 she presented with right upper quadrant abdominal pain with associated nausea vomiting and some slight change in her bowel habits.  When she came into the ED she was found on ultrasound imaging to have no evidence of cholelithiasis or choledocholithiasis with normal CBD.  Her liver was architecturally normal.She underwent a cross-sectional CT scan that showed a moderate transmural thickening of the right colon compatible with colitis with a normal appendix.  No evidence of cholelithiasis or cholecystitis.  He had subcentimeter renal hypodensities consistent with cysts most likely urologic changes in the stomach or the small bowel no evidence of any small bowel obstruction.  She was started on Flagyl discontinued is never had a recent course of the last few months she describes a sensation within the lesion near her sternal notch.  She has been worked up for possible thyroid dysfunction and has a normal TSH she did not have significant solid food dysphagia bili at times she may have a bit of dysphagia.  She does  not describe any symptoms of transient she has intermittent episodes of pyrosis that occur.  She does not take PPIs for protracted courses of more than 2 weeks otherwise will use Tums and remains with good effect.  She is having some worsening bloating crampy abdominal.  She is using the restroom 2-3 times per day which is unchanged.  She denies any blood in the last few months she has had intermittent episodes of mucus from the rectum.  She does not take basis.  She has not had a prior colonoscopy.    GI Review of Systems Positive as above mild early satiety Negative for odynophagia, change in appetite, weight loss, melena,  Review of Systems General: Denies fevers/chills HEENT: Denies oral lesions Cardiovascular: Denies chest pain Pulmonary: Denies shortness of breath/cough Gastroenterological: See HPI Genitourinary: Denies darkened urine Hematological: Denies easy bruising/bleeding Endocrine: Denies temperature intolerance Psychological: Mood is stable Musculoskeletal: Denies new arthralgias   Medications Current Outpatient Medications  Medication Sig Dispense Refill  . FLUoxetine (PROZAC) 10 MG tablet Take 1 tablet (10 mg total) by mouth daily. 30 tablet 2  . fluticasone (FLONASE) 50 MCG/ACT nasal spray Place 2 sprays into both nostrils daily. (Patient taking differently: Place 2 sprays into both nostrils daily as needed for allergies. ) 16 g 0  . Multiple Vitamin (MULTIVITAMIN) tablet Take 1 tablet by mouth daily.    . NON FORMULARY     . omeprazole (PRILOSEC) 40 MG capsule Take 1 capsule (40 mg total) by mouth 2 (two) times daily. 60 capsule 3   No current facility-administered medications for this visit.  Allergies Allergies  Allergen Reactions  . Amoxicillin     REACTION: swelling. angioedema    Histories Past Medical History:  Diagnosis Date  . Colitis   . Esophagitis   . H. pylori infection   . Sinusitis    Past Surgical History:  Procedure Laterality Date    . TONSILLECTOMY    . WISDOM TOOTH EXTRACTION     Social History   Socioeconomic History  . Marital status: Married    Spouse name: Not on file  . Number of children: Not on file  . Years of education: Not on file  . Highest education level: Not on file  Occupational History  . Not on file  Social Needs  . Financial resource strain: Not on file  . Food insecurity:    Worry: Not on file    Inability: Not on file  . Transportation needs:    Medical: Not on file    Non-medical: Not on file  Tobacco Use  . Smoking status: Never Smoker  . Smokeless tobacco: Never Used  Substance and Sexual Activity  . Alcohol use: No  . Drug use: No  . Sexual activity: Not on file  Lifestyle  . Physical activity:    Days per week: Not on file    Minutes per session: Not on file  . Stress: Not on file  Relationships  . Social connections:    Talks on phone: Not on file    Gets together: Not on file    Attends religious service: Not on file    Active member of club or organization: Not on file    Attends meetings of clubs or organizations: Not on file    Relationship status: Not on file  . Intimate partner violence:    Fear of current or ex partner: Not on file    Emotionally abused: Not on file    Physically abused: Not on file    Forced sexual activity: Not on file  Other Topics Concern  . Not on file  Social History Narrative  . Not on file   Family History  Problem Relation Age of Onset  . GER disease Mother   . Colon cancer Neg Hx   . Esophageal cancer Neg Hx   . Inflammatory bowel disease Neg Hx   . Liver disease Neg Hx   . Pancreatic cancer Neg Hx   . Rectal cancer Neg Hx   . Stomach cancer Neg Hx    I have reviewed her medical, social, and family history in detail and updated the electronic medical record as necessary.    PHYSICAL EXAMINATION  BP 120/78   Pulse 72   Ht 5\' 3"  (1.6 m)   Wt 182 lb (82.6 kg)   BMI 32.24 kg/m  Wt Readings from Last 3 Encounters:   06/13/18 182 lb (82.6 kg)  05/01/18 185 lb (83.9 kg)  01/29/18 174 lb 4 oz (79 kg)  GEN: NAD, appears stated age, doesn't appear chronically ill PSYCH: Cooperative, without pressured speech EYE: Conjunctivae pink, sclerae anicteric ENT: MMM, without oral ulcers, no erythema or exudates noted NECK: Supple, no thyromegaly appreciated patient swallowing thyroid nodularity by CV: RR without R/Gs  RESP: CTAB posteriorly, without wheezing GI: NABS, soft, mild tenderness patient, ND, without rebound or guarding, no HSM appreciated MSK/EXT: No lower extremity present SKIN: No jaundice NEURO:  Alert & Oriented x 3, no focal deficits   REVIEW OF DATA  I reviewed the following data at the time of  this encounter:  GI Procedures and Studies  2015 EGD Non-erosive esophagitis. Gastric mucosa normal appearing No duodenal lesions noted Gastric biopsies negative for HP but showed chronic gastritis  Laboratory Studies  Reviewed in EPIC  Imaging Studies  4/19 CTAP IMPRESSION: 1. Moderate transmural thickening of the right colon compatible with colitis. Normal appendix. 2. No cholelithiasis or secondary signs of acute cholecystitis. 3. Bilateral subcentimeter renal hypodensity statistically consistent with cysts but are too small to further characterize.  4/19 US IMPRESSION: 1. No evidence for cholelithiasis. 2. Increased echogenicity of the liver parenchyma suggests fatty deposition.   ASSESSMENT  Vanessa Byrd is a 42 y.o. female with a pmh significant for prior Anxiety/MDD, prior Colitis NOS.  The patient is seen today for evaluation and management of:  1. Abdominal pain, unspecified abdominal location   2. Abnormal CT scan   3. Abnormal swallowing   4. Gastroesophageal reflux disease with esophagitis    The patient is hemodynamically stable.  The etiology of her discomfort remains unclear.  The sensation she is experiencing in her neck region is not the most clear for esophageal  pathology but with her symptoms it is worthwhile to consider an upper endoscopic evaluation.  If that is unrevealing I think cross-sectional imaging of her neck would be the next steps to ensure we are not missing any other areas or concerns of extrinsic impression or other lesions such as thyroid dysfunction (TSH recently checked has been within normal limits).  In regards to performing a reevaluation an upper endoscopy is reasonable.  She has had recent right upper quadrant discomfort as well as bloating and cross-sectional imaging that showed evidence of transmural thickening.  Although her symptoms are not classic for inflammatory bowel disease I think it is reasonable to consider this in her work-up.  The changes could be a result of prior infectious colitis but I think we want to be thoughtful about things.  We discussed the role of a colonoscopy for evaluation endoscopically as well as obtaining biopsies.  The risks and benefits of endoscopic evaluation were discussed with the patient; these include but are not limited to the risk of perforation, infection, bleeding, missed lesions, lack of diagnosis, severe illness requiring hospitalization, as well as anesthesia and sedation related illnesses.  The patient is agreeable to proceed.  We discussed the role of trying to highly suppress her acid production and thus a prescription for 40 mg twice daily PPI will be sent to the pharmacy.  All patient questions were answered, to the best of my ability, and the patient agrees to the aforementioned plan of action with follow-up as indicated.  PLAN  1. Abdominal pain, unspecified abdominal location - CBC with Differential/Platelet; Future - IBC panel; Future - Ferritin; Future - IgA; Future - Tissue transglutaminase, IgA; Future  2. Abnormal swallowing - EGD with EoE biopsies and rule out stricture/stenosis - If no structural issue then CT-Neck  3. Gastroesophageal reflux disease with esophagitis - Trial  6-8 weeks of twice daily PPI  4. Mucous in stools - Colonoscopy to rule out IBD & Microscopic colitis  5. Abnormal CT scan   Orders Placed This Encounter  Procedures  . CBC with Differential/Platelet  . IBC panel  . Ferritin  . IgA  . Tissue transglutaminase, IgA    New Prescriptions   OMEPRAZOLE (PRILOSEC) 40 MG CAPSULE    Take 1 capsule (40 mg total) by mouth 2 (two) times daily.   Modified Medications   No medications on file  Planned Follow Up: No follow-ups on file.   Justice Britain, MD [facility] Office # [phone]

## 2018-06-13 NOTE — Patient Instructions (Signed)
Your provider has requested that you go to the basement level for lab work before leaving today. Press "B" on the elevator. The lab is located at the first door on the left as you exit the elevator.  We have sent the following medications to your pharmacy for you to pick up at your convenience: omeprazole.

## 2018-06-14 LAB — TISSUE TRANSGLUTAMINASE, IGA: (TTG) AB, IGA: 1 U/mL

## 2018-06-18 ENCOUNTER — Other Ambulatory Visit: Payer: Self-pay

## 2018-06-18 DIAGNOSIS — D649 Anemia, unspecified: Secondary | ICD-10-CM

## 2018-06-18 DIAGNOSIS — R109 Unspecified abdominal pain: Secondary | ICD-10-CM

## 2018-06-18 MED ORDER — FERROUS GLUCONATE 324 (38 FE) MG PO TABS: 324.0000 mg | ORAL_TABLET | Freq: Every day | ORAL | 0 refills | Status: DC

## 2018-06-22 ENCOUNTER — Encounter: Payer: Self-pay | Admitting: Family Medicine

## 2018-06-22 ENCOUNTER — Ambulatory Visit (INDEPENDENT_AMBULATORY_CARE_PROVIDER_SITE_OTHER): Payer: No Typology Code available for payment source | Admitting: Family Medicine

## 2018-06-22 ENCOUNTER — Telehealth: Payer: Self-pay

## 2018-06-22 VITALS — BP 112/60 | HR 73 | Temp 98.7°F | Ht 63.0 in | Wt 184.5 lb

## 2018-06-22 DIAGNOSIS — M79675 Pain in left toe(s): Secondary | ICD-10-CM | POA: Diagnosis not present

## 2018-06-22 LAB — URIC ACID: URIC ACID, SERUM: 4 mg/dL (ref 2.4–7.0)

## 2018-06-22 MED ORDER — PREDNISONE 10 MG PO TABS: ORAL_TABLET | ORAL | 0 refills | Status: DC

## 2018-06-22 NOTE — Progress Notes (Signed)
   Subjective:    Patient ID: Vanessa Byrd, female    DOB: 07-May-1976, 42 y.o.   MRN: [id number]  HPI  42 year old female pt of [facility] presents with new onset swelling  Left  Great toe and second toe. Redness and swelling at base of each toe. 2-3 /10.  Throbbing pain at rest, worse with putting shoes on.   No known injury or fall.  Recent change in shoes one day walking.. But wearing tennis shoes since.   Stands a lot during the day at work. 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 No fever.  She has tried benadryl.    Hx no OA, no gout  Blood pressure 112/60, pulse 73, temperature 98.7 F (37.1 C), temperature source Oral, height 5\' 3"  (1.6 m), weight 184 lb 8 oz (83.7 kg).    Review of Systems  Constitutional: Negative for fatigue and fever.  HENT: Negative for congestion.   Eyes: Negative for pain.  Respiratory: Negative for cough and shortness of breath.   Cardiovascular: Negative for chest pain, palpitations and leg swelling.  Gastrointestinal: Negative for abdominal pain.  Genitourinary: Negative for dysuria and vaginal bleeding.  Musculoskeletal: Negative for back pain.  Neurological: Negative for syncope, light-headedness and headaches.  Psychiatric/Behavioral: Negative for dysphoric mood.       Objective:   Physical Exam  Constitutional: Vital signs are normal. She appears well-developed and well-nourished. She is cooperative.  Non-toxic appearance. She does not appear ill. No distress.  HENT:  Head: Normocephalic.  Right Ear: Hearing, tympanic membrane, external ear and ear canal normal. Tympanic membrane is not erythematous, not retracted and not bulging.  Left Ear: Hearing, tympanic membrane, external ear and ear canal normal. Tympanic membrane is not erythematous, not retracted and not bulging.  Nose: No mucosal edema or rhinorrhea. Right sinus exhibits no maxillary sinus tenderness and no frontal sinus tenderness. Left sinus exhibits no maxillary  sinus tenderness and no frontal sinus tenderness.  Mouth/Throat: Uvula is midline, oropharynx is clear and moist and mucous membranes are normal.  Eyes: Pupils are equal, round, and reactive to light. Conjunctivae, EOM and lids are normal. Lids are everted and swept, no foreign bodies found.  Neck: Trachea normal and normal range of motion. Neck supple. Carotid bruit is not present. No thyroid mass and no thyromegaly present.  Cardiovascular: Normal rate, regular rhythm, S1 normal, S2 normal, normal heart sounds, intact distal pulses and normal pulses. Exam reveals no gallop and no friction rub.  No murmur heard. Pulmonary/Chest: Effort normal and breath sounds normal. No tachypnea. No respiratory distress. She has no decreased breath sounds. She has no wheezes. She has no rhonchi. She has no rales.  Abdominal: Soft. Normal appearance and bowel sounds are normal. There is no tenderness.  Musculoskeletal:       Right ankle: Normal. Achilles tendon normal.       Right foot: There is tenderness and swelling. There is normal range of motion and no bony tenderness.       Feet:  Neurological: She is alert.  Skin: Skin is warm, dry and intact. No rash noted.  Psychiatric: Her speech is normal and behavior is normal. Judgment and thought content normal. Her mood appears not anxious. Cognition and memory are normal. She does not exhibit a depressed mood.          Assessment & Plan:

## 2018-06-22 NOTE — Telephone Encounter (Signed)
Note from [facility] faxed pt having lt toes feeling swollen and red and spreading to top of foot. Pt already has appt with Dr Diona Browner 06/22/18 at 9:00 AM and pt has been arrived.Marland Kitchen

## 2018-06-22 NOTE — Patient Instructions (Addendum)
Please stop at the lab to have labs drawn.  Complete a course prednisone course.   Elevated foot and ice as able. Call if increase in swelling or pain up into leg.

## 2018-06-22 NOTE — Assessment & Plan Note (Signed)
Area of pain and onset  Typical of gout, but not as painful and warm as expected. ? Bite and allergic swelling at site.  No sign of infeciton.  No clear suggestion of fracture or indication for X-ray.  treat with i.ce, elevation, pred taper... eval with  uric acid lab

## 2018-06-25 NOTE — Telephone Encounter (Signed)
Pt was seen 06/22/18 by Dr Diona Browner.

## 2018-07-16 MED FILL — MEDROXYPROGESTERONE 2.5 MG: 2.5 | 30 days supply | Qty: 30 | Fill #0

## 2018-07-16 MED FILL — ESTRADIOL 1 MG TABLET: 1 | 30 days supply | Qty: 30 | Fill #0

## 2018-07-19 ENCOUNTER — Encounter: Payer: Self-pay | Admitting: Gastroenterology

## 2018-07-19 ENCOUNTER — Ambulatory Visit (INDEPENDENT_AMBULATORY_CARE_PROVIDER_SITE_OTHER): Payer: No Typology Code available for payment source | Admitting: Gastroenterology

## 2018-07-19 VITALS — BP 110/80 | HR 68 | Ht 62.5 in | Wt 181.1 lb

## 2018-07-19 DIAGNOSIS — R151 Fecal smearing: Secondary | ICD-10-CM

## 2018-07-19 DIAGNOSIS — K2100 Gastro-esophageal reflux disease with esophagitis, without bleeding: Secondary | ICD-10-CM

## 2018-07-19 DIAGNOSIS — R9389 Abnormal findings on diagnostic imaging of other specified body structures: Secondary | ICD-10-CM

## 2018-07-19 DIAGNOSIS — R194 Change in bowel habit: Secondary | ICD-10-CM

## 2018-07-19 DIAGNOSIS — K625 Hemorrhage of anus and rectum: Secondary | ICD-10-CM

## 2018-07-19 DIAGNOSIS — E611 Iron deficiency: Secondary | ICD-10-CM | POA: Insufficient documentation

## 2018-07-19 MED ORDER — PEG 3350-KCL-NA BICARB-NACL 420 G PO SOLR: 4000.0000 mL | ORAL | 0 refills | Status: DC

## 2018-07-19 NOTE — Patient Instructions (Signed)
Normal BMI (Body Mass Index- based on height and weight) is between 19 and 25. Your BMI today is Body mass index is 32.6 kg/m. Marland Kitchen Please consider follow up  regarding your BMI with your Primary Care Provider.  Please take fibercon 1-2 tabs daily otc  Thank you for entrusting me with your care and choosing [facility].  Dr Rush Landmark

## 2018-07-19 NOTE — Progress Notes (Signed)
[facility] VISIT   Primary Care Provider Jearld Fenton, NP [address] [place] [address] [phone]  Patient Profile: Vanessa Byrd is a 42 y.o. female with a pmh significant for prior Anxiety/MDD, prior Colitis NOS, Iron Insufficiency.  The patient presents to the [facility] for an evaluation and management of problem(s) noted below:  Problem List 1. Change in bowel habit   2. Gastroesophageal reflux disease with esophagitis   3. Rectal bleeding   4. Fecal soiling   5. Abnormal CT scan     History of Present Illness: Please see initial consultation note for full details of history of present illness.  In brief, this patient was evaluated in the past with endoscopy years ago in the setting of heartburn symptoms and finding of nonerosive esophagitis.  In the spring of this year she developed right upper quadrant abdominal discomfort with associated nausea and vomiting and changes in her bowel habits.  Imaging did not show evidence of a abnormality both on ultrasound as well as CT however her CT showed some transmural thickening of the right colon compatible with colitis and a normal index.  She was prescribed on Flagyl and subsequently discontinued this.  She has complained of issues of dysphagia in the past and was not on PPI for protracted course.  When seen we decided to consider a colonoscopy for evaluation of her changes in bowel habits and for the abnormal CT imaging.  We also considered a 6 to 8-week PPI therapy trial to see how her symptoms would go and determine need for endoscopy.  We found on her labs that she had some mild iron insufficiency with plan to initiate her on iron daily.    Interval History The patient has been doing well since we last evaluated.  She ended up not scheduling her colonoscopy as of yet.  She describes feeling improvement on twice daily PPI.  She is able to remember more than 80% of the  time to take twice daily PPI.  She still has a pulsation in her neck at times however there is no overt dysphagia symptoms.  She feels like some of the discomfort ability in that region has improved while being on twice daily PPI.  She is not feeling overt heartburn or reflux currently.  The patient also describes since being initiated on iron a change in bowel habits and having a bit more constipation.  Stools are slightly darker in quality but are remaining brown.  The patient noted rectal bleeding yesterday.  She does describe some fecal soilage very infrequently every 1 to 2 weeks where she will notice that she may not have completely cleaned herself and has a sensation of some sort of lump within her perineum.  She is not having significant abdominal pain currently.  Weight has been stable.  She describes that her son was recently undergoing an evaluation for abdominal pains and ended up having a colonoscopy and is being diagnosed as functional abdominal pain.  GI Review of Systems Positive as above mild early satiety Negative for odynophagia, nausea, vomiting, change in appetite, anorexia, melena  Review of Systems General: Denies fevers/chills Cardiovascular: Denies chest pain Pulmonary: Denies shortness of breath Gastroenterological: See HPI Hematological: Denies easy bruising/bleeding Psychological: Mood is stable   Medications Current Outpatient Medications  Medication Sig Dispense Refill  . ferrous gluconate (FERGON) 324 MG tablet Take 1 tablet (324 mg total) by mouth daily with breakfast. 30 tablet 0  .  FLUoxetine (PROZAC) 10 MG tablet Take 1 tablet (10 mg total) by mouth daily. 30 tablet 2  . fluticasone (FLONASE) 50 MCG/ACT nasal spray Place 2 sprays into both nostrils daily. (Patient taking differently: Place 2 sprays into both nostrils daily as needed for allergies. ) 16 g 0  . Multiple Vitamin (MULTIVITAMIN) tablet Take 1 tablet by mouth daily.    . NON FORMULARY     .  omeprazole (PRILOSEC) 40 MG capsule Take 1 capsule (40 mg total) by mouth 2 (two) times daily. 60 capsule 3  . polyethylene glycol-electrolytes (NULYTELY/GOLYTELY) 420 g solution Take 4,000 mLs by mouth as directed. 4000 mL 0   No current facility-administered medications for this visit.     Allergies Allergies  Allergen Reactions  . Amoxicillin     REACTION: swelling. angioedema    Histories Past Medical History:  Diagnosis Date  . Colitis   . Esophagitis   . H. pylori infection   . Sinusitis    Past Surgical History:  Procedure Laterality Date  . TONSILLECTOMY    . WISDOM TOOTH EXTRACTION     Social History   Socioeconomic History  . Marital status: Married    Spouse name: Not on file  . Number of children: Not on file  . Years of education: Not on file  . Highest education level: Not on file  Occupational History  . Not on file  Social Needs  . Financial resource strain: Not on file  . Food insecurity:    Worry: Not on file    Inability: Not on file  . Transportation needs:    Medical: Not on file    Non-medical: Not on file  Tobacco Use  . Smoking status: Never Smoker  . Smokeless tobacco: Never Used  Substance and Sexual Activity  . Alcohol use: No  . Drug use: No  . Sexual activity: Not on file  Lifestyle  . Physical activity:    Days per week: Not on file    Minutes per session: Not on file  . Stress: Not on file  Relationships  . Social connections:    Talks on phone: Not on file    Gets together: Not on file    Attends religious service: Not on file    Active member of club or organization: Not on file    Attends meetings of clubs or organizations: Not on file    Relationship status: Not on file  . Intimate partner violence:    Fear of current or ex partner: Not on file    Emotionally abused: Not on file    Physically abused: Not on file    Forced sexual activity: Not on file  Other Topics Concern  . Not on file  Social History Narrative   . Not on file   Family History  Problem Relation Age of Onset  . GER disease Mother   . Colon cancer Neg Hx   . Esophageal cancer Neg Hx   . Inflammatory bowel disease Neg Hx   . Liver disease Neg Hx   . Pancreatic cancer Neg Hx   . Rectal cancer Neg Hx   . Stomach cancer Neg Hx    I have reviewed her medical, social, and family history in detail and updated the electronic medical record as necessary.    PHYSICAL EXAMINATION  BP 110/80 (BP Location: Left Arm, Patient Position: Sitting, Cuff Size: Normal)   Pulse 68   Ht 5' 2.5" (1.588 m) Comment: height measured  without shoes  Wt 181 lb 2 oz (82.2 kg)   BMI 32.60 kg/m  Wt Readings from Last 3 Encounters:  07/19/18 181 lb 2 oz (82.2 kg)  06/22/18 184 lb 8 oz (83.7 kg)  06/13/18 182 lb (82.6 kg)  GEN: NAD, appears stated age, doesn't appear chronically ill PSYCH: Cooperative, without pressured speech EYE: Conjunctivae pink, sclerae anicteric ENT: MMM CV: RR without R/Gs  RESP: CTAB posteriorly, without wheezing GI: NABS, soft, nontender/nondistended without rebound or guarding GU: DRE deferred due to desire to have done during colonoscopy MSK/EXT: No lower extremity present SKIN: No jaundice NEURO:  Alert & Oriented x 3, no focal deficits   REVIEW OF DATA  I reviewed the following data at the time of this encounter:  GI Procedures and Studies  No new studies to review  Laboratory Studies  Reviewed in EPIC  Imaging Studies  No new studies   ASSESSMENT  Ms. Khamis is a 42 y.o. female with a pmh significant for prior Anxiety/MDD, prior Colitis NOS.  The patient is seen today for evaluation and management of:  1. Change in bowel habit   2. Gastroesophageal reflux disease with esophagitis   3. Rectal bleeding   4. Fecal soiling   5. Abnormal CT scan    Overall, the patient is doing better on her PPI therapy and I suspect that her symptoms were truly GERD related.  We will again maintain her PPI at current  dosing for now.  I normally would consider holding on upper endoscopic evaluation however she is having lower GI symptoms that do require further work-up.  I suspect her symptoms are likely hemorrhoidal as the cause of the rectal bleeding and at times noting some soilage although she has no true incontinence.  With her previous CT scan that showed changes in the right colon of an unclear etiology may be a result of any infectious colitis do think is reasonable for [place] to evaluate the colon as well.  We will plan to evaluate the colon and obtain biopsies of the right and left colon and try and intubate the terminal ileum if possible.  We will proceed with an upper endoscopy and take EOE biopsies as well as gastric biopsies for H. Pylori.  The risks and benefits of endoscopic evaluation were discussed with the patient; these include but are not limited to the risk of perforation, infection, bleeding, missed lesions, lack of diagnosis, severe illness requiring hospitalization, as well as anesthesia and sedation related illnesses.  The patient is agreeable to proceed.  All patient questions were answered, to the best of my ability, and the patient agrees to the aforementioned plan of action with follow-up as indicated.   PLAN  1. Gastroesophageal reflux disease with esophagitis - Continue current PPI dose for now -Plan for endoscopy to evaluate and take EOE biopsies as well as gastric biopsies for her prior issues of abdominal discomfort postprandially  2. Change in bowel habit - Colonoscopy with TI/right colon/left colon/rectal biopsies  3. Rectal bleeding - Initiate FiberCon tablets 1-2 times daily - OK to use over-the-counter Preparation H suppositories for presumed hemorrhoids  4. Fecal soiling  5. Abnormal CT scan -Follow-up based on colonoscopy evaluation   Orders Placed This Encounter  Procedures  . Ambulatory referral to Gastroenterology    New Prescriptions   POLYETHYLENE  GLYCOL-ELECTROLYTES (NULYTELY/GOLYTELY) 420 G SOLUTION    Take 4,000 mLs by mouth as directed.   Modified Medications   No medications on file  Planned Follow Up: No follow-ups on file.   Justice Britain, MD [facility] Office # [phone]

## 2018-07-21 ENCOUNTER — Encounter: Payer: Self-pay | Admitting: Gastroenterology

## 2018-07-21 DIAGNOSIS — R151 Fecal smearing: Secondary | ICD-10-CM | POA: Insufficient documentation

## 2018-07-21 DIAGNOSIS — R194 Change in bowel habit: Secondary | ICD-10-CM | POA: Insufficient documentation

## 2018-07-21 DIAGNOSIS — K625 Hemorrhage of anus and rectum: Secondary | ICD-10-CM | POA: Insufficient documentation

## 2018-08-27 ENCOUNTER — Telehealth: Payer: Self-pay | Admitting: Gastroenterology

## 2018-08-27 MED FILL — PEG-3350 SOLUTION: 420 | 1 days supply | Qty: 4000 | Fill #0

## 2018-08-27 NOTE — Telephone Encounter (Signed)
The pt has cough, no fever and feels better today.  Advised she can have procedure as planned.  She needs to call if symptoms change ie; fever or worse cough. The pt has been advised of the information and verbalized understanding.

## 2018-08-27 NOTE — Telephone Encounter (Signed)
Returned call however the voice mail was full. Will wait for further communication from the pt

## 2018-08-27 NOTE — Telephone Encounter (Signed)
Pt is scheduled for endo colon on 08/30/18, she states that she had been sick, she did not have fever but had productive cough, she feels better today and states that cough is dry. She wants to know if she can keep her appt. Pls call her.

## 2018-08-28 ENCOUNTER — Telehealth: Payer: Self-pay | Admitting: Physician Assistant

## 2018-08-28 ENCOUNTER — Telehealth: Payer: Self-pay | Admitting: Gastroenterology

## 2018-08-28 NOTE — Telephone Encounter (Signed)
08/28/18 1517 Returned patient call.  Spoke with patient in regards to prep message left by [employer]. Relayed information. Patient will be finished with prep and NPO starting at 1030 on 08/30/17. I answered all questions.  Ellouise Newer, PA-C [facility]

## 2018-08-28 NOTE — Telephone Encounter (Signed)
Called patient again with message today regarding changes in prep instructions for 1330 case on 08/30/2018. Arrival time :1230 on the 4th floor. No changes in prep on 08/29/2018 Prep instructions for day of procedure 08/30/2018 At 730 am start prep, 8 oz every 15 mions until completed. Continued to drink until 1030 am with nothing by mouth after 1030. Medications can be taken until 1030 am.

## 2018-08-28 NOTE — Telephone Encounter (Signed)
Pt proc was moved up to 1:30pm and she has questions about the pre op directions will they change any??

## 2018-08-28 NOTE — Telephone Encounter (Signed)
Called pt and left message to call our office back. We would be closing early today. Gwyndolyn Saxon

## 2018-08-30 ENCOUNTER — Encounter: Payer: No Typology Code available for payment source | Admitting: Gastroenterology

## 2018-08-30 ENCOUNTER — Telehealth: Payer: Self-pay | Admitting: Gastroenterology

## 2018-08-30 ENCOUNTER — Encounter: Payer: Self-pay | Admitting: Gastroenterology

## 2018-08-30 ENCOUNTER — Ambulatory Visit (AMBULATORY_SURGERY_CENTER): Payer: No Typology Code available for payment source | Admitting: Gastroenterology

## 2018-08-30 VITALS — BP 134/87 | HR 74 | Temp 97.1°F | Resp 13

## 2018-08-30 DIAGNOSIS — K253 Acute gastric ulcer without hemorrhage or perforation: Secondary | ICD-10-CM | POA: Diagnosis not present

## 2018-08-30 DIAGNOSIS — D123 Benign neoplasm of transverse colon: Secondary | ICD-10-CM | POA: Diagnosis not present

## 2018-08-30 DIAGNOSIS — K3189 Other diseases of stomach and duodenum: Secondary | ICD-10-CM | POA: Diagnosis not present

## 2018-08-30 DIAGNOSIS — R197 Diarrhea, unspecified: Secondary | ICD-10-CM | POA: Diagnosis not present

## 2018-08-30 DIAGNOSIS — K229 Disease of esophagus, unspecified: Secondary | ICD-10-CM

## 2018-08-30 DIAGNOSIS — K2100 Gastro-esophageal reflux disease with esophagitis, without bleeding: Secondary | ICD-10-CM

## 2018-08-30 DIAGNOSIS — R194 Change in bowel habit: Secondary | ICD-10-CM | POA: Diagnosis not present

## 2018-08-30 DIAGNOSIS — K259 Gastric ulcer, unspecified as acute or chronic, without hemorrhage or perforation: Secondary | ICD-10-CM

## 2018-08-30 MED ORDER — SODIUM CHLORIDE 0.9 % IV SOLN: 500.0000 mL | Freq: Once | INTRAVENOUS | Status: DC

## 2018-08-30 NOTE — Progress Notes (Signed)
Pt's states no medical or surgical changes since previsit or office visit. 

## 2018-08-30 NOTE — Patient Instructions (Signed)
Information on polyps and hemorrhoids and high fiber diet given to you today.  Await pathology reports.  Stop any NSAIDS that may be ongoing.  Continue omeprazole 40mg  twice a day for the next 3 months.    YOU HAD AN ENDOSCOPIC PROCEDURE TODAY AT [facility]:   Refer to the procedure report that was given to you for any specific questions about what was found during the examination.  If the procedure report does not answer your questions, please call your gastroenterologist to clarify.  If you requested that your care partner not be given the details of your procedure findings, then the procedure report has been included in a sealed envelope for you to review at your convenience later.  YOU SHOULD EXPECT: Some feelings of bloating in the abdomen. Passage of more gas than usual.  Walking can help get rid of the air that was put into your GI tract during the procedure and reduce the bloating. If you had a lower endoscopy (such as a colonoscopy or flexible sigmoidoscopy) you may notice spotting of blood in your stool or on the toilet paper. If you underwent a bowel prep for your procedure, you may not have a normal bowel movement for a few days.  Please Note:  You might notice some irritation and congestion in your nose or some drainage.  This is from the oxygen used during your procedure.  There is no need for concern and it should clear up in a day or so.  SYMPTOMS TO REPORT IMMEDIATELY:   Following lower endoscopy (colonoscopy or flexible sigmoidoscopy):  Excessive amounts of blood in the stool  Significant tenderness or worsening of abdominal pains  Swelling of the abdomen that is new, acute  Fever of 100F or higher   Following upper endoscopy (EGD)  Vomiting of blood or coffee ground material  New chest pain or pain under the shoulder blades  Painful or persistently difficult swallowing  New shortness of breath  Fever of 100F or higher  Black, tarry-looking  stools  For urgent or emergent issues, a gastroenterologist can be reached at any hour by calling [phone].   DIET:  We do recommend a small meal at first, but then you may proceed to your regular diet.  Drink plenty of fluids but you should avoid alcoholic beverages for 24 hours.  ACTIVITY:  You should plan to take it easy for the rest of today and you should NOT DRIVE or use heavy machinery until tomorrow (because of the sedation medicines used during the test).    FOLLOW UP: Our staff will call the number listed on your records the next business day following your procedure to check on you and address any questions or concerns that you may have regarding the information given to you following your procedure. If we do not reach you, we will leave a message.  However, if you are feeling well and you are not experiencing any problems, there is no need to return our call.  We will assume that you have returned to your regular daily activities without incident.  If any biopsies were taken you will be contacted by phone or by letter within the next 1-3 weeks.  Please call us at [phone] if you have not heard about the biopsies in 3 weeks.    SIGNATURES/CONFIDENTIALITY: You and/or your care partner have signed paperwork which will be entered into your electronic medical record.  These signatures attest to the fact that that the information  above on your After Visit Summary has been reviewed and is understood.  Full responsibility of the confidentiality of this discharge information lies with you and/or your care-partner. 

## 2018-08-30 NOTE — Op Note (Signed)
[facility] Patient Name: Vanessa Byrd Procedure Date: 08/30/2018 2:47 PM MRN: [id number] Endoscopist: Justice Britain , MD Age: 43 Referring MD:  Date of Birth: May 23, 1976 Gender: Female Account #: [id number] Procedure:                Colonoscopy Indications:              Clinically significant diarrhea of unexplained                            origin, Rectal bleeding, Abnormal CT of the GI                            tract, Change in bowel habits Medicines:                Monitored Anesthesia Care Procedure:                Pre-Anesthesia Assessment:                           - Prior to the procedure, a History and Physical                            was performed, and patient medications and                            allergies were reviewed. The patient's tolerance of                            previous anesthesia was also reviewed. The risks                            and benefits of the procedure and the sedation                            options and risks were discussed with the patient.                            All questions were answered, and informed consent                            was obtained. Prior Anticoagulants: The patient has                            taken no previous anticoagulant or antiplatelet                            agents. ASA Grade Assessment: II - A patient with                            mild systemic disease. After reviewing the risks                            and benefits, the patient was deemed in  satisfactory condition to undergo the procedure.                           After obtaining informed consent, the colonoscope                            was passed under direct vision. Throughout the                            procedure, the patient's blood pressure, pulse, and                            oxygen saturations were monitored continuously. The                            Colonoscope was introduced  through the anus and                            advanced to the 5 cm into the ileum. The                            colonoscopy was performed without difficulty. The                            patient tolerated the procedure. The quality of the                            bowel preparation was evaluated using the BBPS                            [facility] Bowel Preparation Scale) with scores of:                            Right Colon = 2 (minor amount of residual staining,                            small fragments of stool and/or opaque liquid, but                            mucosa seen well), Transverse Colon = 3 (entire                            mucosa seen well with no residual staining, small                            fragments of stool or opaque liquid) and Left Colon                            = 3 (entire mucosa seen well with no residual                            staining, small fragments of stool or opaque  liquid). The total BBPS score equals 8. The quality                            of the bowel preparation was good. Scope In: 3:07:02 PM Scope Out: 3:27:31 PM Scope Withdrawal Time: 0 hours 15 minutes 1 second  Total Procedure Duration: 0 hours 20 minutes 29 seconds  Findings:                 The digital rectal exam findings include                            non-thrombosed internal hemorrhoids. Pertinent                            negatives include no palpable rectal lesions.                           The terminal ileum and ileocecal valve appeared                            normal. Biopsies were taken with a cold forceps for                            histology to rule out IBD.                           A 2 mm polyp was found in the hepatic flexure. The                            polyp was sessile. The polyp was removed with a                            jumbo cold forceps. Resection and retrieval were                            complete.                            Normal mucosa was found in the entire colon.                            Biopsies were taken with a cold forceps for                            histology to rule out Microscopic colitis and IBD.                           Normal mucosa was found in the rectum. Biopsies                            were taken with a cold forceps for histology to                            rule out proctitis.  Non-bleeding non-thrombosed internal hemorrhoids                            were found during retroflexion, during perianal                            exam and during digital exam. The hemorrhoids were                            Grade II (internal hemorrhoids that prolapse but                            reduce spontaneously). Complications:            No immediate complications. Impression:               - Non-thrombosed internal hemorrhoids found on                            digital rectal exam.                           - The examined portion of the ileum was normal.                            Biopsied.                           - One 2 mm polyp at the hepatic flexure, removed                            with a jumbo cold forceps. Resected and retrieved.                           - Normal mucosa in the entire examined colon.                            Biopsied.                           - Normal mucosa in the rectum. Biopsied.                           - Non-bleeding non-thrombosed internal hemorrhoids. Recommendation:           - The patient will be observed post-procedure,                            until all discharge criteria are met.                           - Discharge patient to home.                           - Patient has a contact number available for  emergencies. The signs and symptoms of potential                            delayed complications were discussed with the                            patient. Return to normal  activities tomorrow.                            Written discharge instructions were provided to the                            patient.                           - High fiber diet.                           - Continue present medications.                           - Await pathology results.                           - Repeat colonoscopy in 5-10 years for surveillance                            based on pathology results and findings of                            adenomatous tissue.                           - The findings and recommendations were discussed                            with the patient.                           - The findings and recommendations were discussed                            with the patient's family. Justice Britain, MD 08/30/2018 3:48:39 PM

## 2018-08-30 NOTE — Progress Notes (Signed)
Called to room to assist during endoscopic procedure.  Patient ID and intended procedure confirmed with present staff. Received instructions for my participation in the procedure from the performing physician.  

## 2018-08-30 NOTE — Op Note (Signed)
[place] Patient Name: Vanessa Byrd Procedure Date: 08/30/2018 2:48 PM MRN: [id number] Endoscopist: Justice Britain , MD Age: 43 Referring MD:  Date of Birth: [birth year] Gender: Female Account #: [id number] Procedure:                Upper GI endoscopy Indications:              Diagnostic procedure, Epigastric abdominal pain,                            Heartburn, Diarrhea Medicines:                Monitored Anesthesia Care Procedure:                Pre-Anesthesia Assessment:                           - Prior to the procedure, a History and Physical                            was performed, and patient medications and                            allergies were reviewed. The patient's tolerance of                            previous anesthesia was also reviewed. The risks                            and benefits of the procedure and the sedation                            options and risks were discussed with the patient.                            All questions were answered, and informed consent                            was obtained. Prior Anticoagulants: The patient has                            taken no previous anticoagulant or antiplatelet                            agents. ASA Grade Assessment: II - A patient with                            mild systemic disease. After reviewing the risks                            and benefits, the patient was deemed in                            satisfactory condition to undergo the procedure.  After obtaining informed consent, the endoscope was                            passed under direct vision. Throughout the                            procedure, the patient's blood pressure, pulse, and                            oxygen saturations were monitored continuously. The                            Model GIF-HQ190 [id number]) scope was introduced                            through the mouth, and  advanced to the second part                            of duodenum. The upper GI endoscopy was                            accomplished without difficulty. The patient                            tolerated the procedure. Scope In: Scope Out: Findings:                 No gross lesions were noted in the proximal                            esophagus, in the mid esophagus and in the distal                            esophagus. Biopsies were taken with a cold forceps                            for histology. Biopsies were taken with a cold                            forceps for histology.                           The esophagus and gastroesophageal junction were                            examined with white light and narrow band imaging                            (NBI) from a forward view and retroflexed position.                            There were esophageal mucosal changes suspicious  for short-segment Barrett's esophagus. These                            changes involved the mucosa along an irregular                            Z-line (40 cm from the incisors). Islands of                            salmon-colored mucosa were present at 39 cm. The                            maximum longitudinal extent of these esophageal                            mucosal changes was 1 cm in length.                           Two non-bleeding superficial gastric ulcers with a                            clean ulcer base (Forrest Class III) were found in                            the gastric antrum. The largest lesion was 6 mm in                            largest dimension.                           Multiple dispersed, small non-bleeding erosions                            were found in the gastric antrum and in the                            prepyloric region of the stomach. There were no                            stigmata of recent bleeding.                           No gross  lesions were noted in the entire examined                            stomach. Biopsies were taken with a cold forceps                            for histology and Helicobacter pylori testing.                           No gross lesions were noted in the duodenal bulb,  in the first portion of the duodenum and in the                            second portion of the duodenum. Biopsies for                            histology were taken with a cold forceps for                            evaluation of celiac disease. Complications:            No immediate complications. Estimated Blood Loss:     Estimated blood loss was minimal. Impression:               - No gross lesions in esophagus. Biopsied.                           - Esophageal mucosal changes suspicious for                            short-segment Barrett's esophagus.                           - Non-bleeding gastric ulcers with a clean ulcer                            base (Forrest Class III).                           - Non-bleeding erosive gastropathy.                           - No gross lesions in the stomach. Biopsied.                           - No gross lesions in the duodenal bulb, in the                            first portion of the duodenum and in the second                            portion of the duodenum. Biopsied. Recommendation:           - Proceed to scheduled colonoscopy.                           - Await pathology results.                           - Observe patient's clinical course.                           - Continue taking Omeprazole 40 mg twice daily for                            next 43-months.                           -  Stop any NSAIDs that may be ongoing.                           - Repeat upper endoscopy in 2-3 months to check                            healing. If patient has persistent ulcer disease at                            that time and no HP on boipsies then will need  to                            consider Gastrin evaluation off PPI.                           - The findings and recommendations were discussed                            with the patient.                           - The findings and recommendations were discussed                            with the patient's family. Justice Britain, MD 08/30/2018 3:40:21 PM

## 2018-08-30 NOTE — Progress Notes (Signed)
Pt awake. VSS. Report given to RN. No anesthetic complications noted 

## 2018-08-31 ENCOUNTER — Telehealth: Payer: Self-pay | Admitting: *Deleted

## 2018-08-31 NOTE — Telephone Encounter (Signed)
  Follow up Call-  Call back number 08/30/2018  Post procedure Call Back phone  # [phone]  Permission to leave phone message Yes  Some recent data might be hidden     Patient questions:  Do you have a fever, pain , or abdominal swelling? No. Pain Score  0 *  Have you tolerated food without any problems? Yes.    Have you been able to return to your normal activities? Yes.    Do you have any questions about your discharge instructions: Diet   No. Medications  No. Follow up visit  No.  Do you have questions or concerns about your Care? No.  Actions: * If pain score is 4 or above: No action needed, pain <4.

## 2018-09-05 ENCOUNTER — Encounter: Payer: Self-pay | Admitting: Gastroenterology

## 2018-09-19 ENCOUNTER — Encounter: Payer: Self-pay | Admitting: Family Medicine

## 2018-09-19 ENCOUNTER — Ambulatory Visit (INDEPENDENT_AMBULATORY_CARE_PROVIDER_SITE_OTHER): Payer: No Typology Code available for payment source | Admitting: Family Medicine

## 2018-09-19 VITALS — BP 106/62 | HR 65 | Temp 97.8°F | Ht 63.0 in | Wt 181.5 lb

## 2018-09-19 DIAGNOSIS — J069 Acute upper respiratory infection, unspecified: Secondary | ICD-10-CM | POA: Diagnosis not present

## 2018-09-19 DIAGNOSIS — H6983 Other specified disorders of Eustachian tube, bilateral: Secondary | ICD-10-CM | POA: Diagnosis not present

## 2018-09-19 NOTE — Progress Notes (Signed)
Subjective:     Vanessa Byrd is a 43 y.o. female presenting for URI (symptoms started on Sunday afternoon 09/16/2018. Drainage, cough, ears are popping, headache. Took Dayquil and Nyquil.)     URI   This is a new problem. The current episode started in the past 7 days. The problem has been unchanged. There has been no fever. Associated symptoms include coughing, ear pain, headaches and a plugged ear sensation. Pertinent negatives include no abdominal pain, chest pain, congestion, nausea, sinus pain, sore throat or vomiting. She has tried decongestant for the symptoms. The treatment provided mild relief.     Review of Systems  Constitutional: Negative for chills and fever.  HENT: Positive for ear pain and postnasal drip. Negative for congestion, sinus pressure, sinus pain and sore throat.   Respiratory: Positive for cough. Negative for shortness of breath.   Cardiovascular: Negative for chest pain.  Gastrointestinal: Negative for abdominal pain, nausea and vomiting.  Neurological: Positive for headaches.     Social History   Tobacco Use  Smoking Status Never Smoker  Smokeless Tobacco Never Used        Objective:    BP Readings from Last 3 Encounters:  09/19/18 106/62  08/30/18 134/87  07/19/18 110/80   Wt Readings from Last 3 Encounters:  09/19/18 181 lb 8 oz (82.3 kg)  07/19/18 181 lb 2 oz (82.2 kg)  06/22/18 184 lb 8 oz (83.7 kg)    BP 106/62   Pulse 65   Temp 97.8 F (36.6 C)   Ht 5\' 3"  (1.6 m)   Wt 181 lb 8 oz (82.3 kg)   LMP 08/20/2018   SpO2 98%   BMI 32.15 kg/m    Physical Exam Constitutional:      General: She is not in acute distress.    Appearance: She is well-developed. She is not diaphoretic.  HENT:     Head: Normocephalic and atraumatic.     Right Ear: Ear canal normal. A middle ear effusion is present. Tympanic membrane is not erythematous, retracted or bulging.     Left Ear: Ear canal normal. A middle ear effusion is present.  Tympanic membrane is not erythematous, retracted or bulging.     Nose: Rhinorrhea present. No mucosal edema.     Right Sinus: No maxillary sinus tenderness or frontal sinus tenderness.     Left Sinus: No maxillary sinus tenderness or frontal sinus tenderness.     Mouth/Throat:     Pharynx: Uvula midline. No oropharyngeal exudate or posterior oropharyngeal erythema.     Tonsils: Swelling: 0 on the right. 0 on the left.  Eyes:     General: No scleral icterus.    Conjunctiva/sclera: Conjunctivae normal.  Neck:     Musculoskeletal: Neck supple.  Cardiovascular:     Rate and Rhythm: Normal rate and regular rhythm.     Heart sounds: Normal heart sounds. No murmur.  Pulmonary:     Effort: Pulmonary effort is normal. No respiratory distress.     Breath sounds: Normal breath sounds.  Lymphadenopathy:     Cervical: No cervical adenopathy.  Skin:    General: Skin is warm and dry.     Capillary Refill: Capillary refill takes less than 2 seconds.  Neurological:     Mental Status: She is alert.           Assessment & Plan:   Problem List Items Addressed This Visit    None    Visit Diagnoses  Viral URI    -  Primary   Eustachian tube dysfunction, bilateral         1) Neti Pot (Saline rinse) -- 2 times day -- if tolerated 2) Flonase (Store Brand ok) - once daily 3) Over the counter congestion medications - like Pseudoephedrine  4) 24 hour allergy medication - claritin, zyrtec, allergra (store brand is OK)  Return if symptoms worsen or fail to improve.  Lesleigh Noe, MD

## 2018-09-19 NOTE — Patient Instructions (Signed)
Based on your symptoms, it looks like you have a virus.   Antibiotics are not need for a viral infection but the following will help:   1. Drink plenty of fluids 2. Get lots of rest  Sinus Congestion 1) Neti Pot (Saline rinse) -- 2 times day -- if tolerated 2) Flonase (Store Brand ok) - once daily 3) Over the counter congestion medications - like Pseudoephedrine  4) 24 hour allergy medication - claritin, zyrtec, allergra (store brand is OK)  Cough 1) Cough drops can be helpful 2) Nyquil (or nighttime cough medication) 3) Honey is proven to be one of the best cough medications   Sore Throat 1) Honey as above, cough drops 2) Ibuprofen or Aleve can be helpful 3) Salt water Gargles  If you develop fevers (Temperature >100.4), chills, worsening symptoms or symptoms lasting longer than 10 days return to clinic.

## 2018-09-24 ENCOUNTER — Telehealth: Payer: Self-pay | Admitting: *Deleted

## 2018-09-24 ENCOUNTER — Telehealth: Payer: No Typology Code available for payment source | Admitting: Physician Assistant

## 2018-09-24 ENCOUNTER — Encounter: Payer: Self-pay | Admitting: Physician Assistant

## 2018-09-24 DIAGNOSIS — J208 Acute bronchitis due to other specified organisms: Principal | ICD-10-CM

## 2018-09-24 DIAGNOSIS — B9689 Other specified bacterial agents as the cause of diseases classified elsewhere: Secondary | ICD-10-CM

## 2018-09-24 MED ORDER — BENZONATATE 200 MG PO CAPS: 200.0000 mg | ORAL_CAPSULE | Freq: Two times a day (BID) | ORAL | 0 refills | Status: DC | PRN

## 2018-09-24 MED ORDER — AZITHROMYCIN 250 MG PO TABS: ORAL_TABLET | ORAL | 0 refills | Status: DC

## 2018-09-24 MED FILL — BENZONATATE 200 MG CAPS: 200 | 10 days supply | Qty: 20 | Fill #0

## 2018-09-24 MED FILL — AZITHROMYCIN 250 MG TABLET: 250 | 5 days supply | Qty: 6 | Fill #0

## 2018-09-24 NOTE — Progress Notes (Addendum)
We are sorry that you are not feeling well.  Here is how we plan to help!  Based on your presentation I believe you most likely have A cough due to bacteria.  When patients have a fever and a productive cough with a change in color or increased sputum production, we are concerned about bacterial bronchitis.  If left untreated it can progress to pneumonia.  If your symptoms do not improve with your treatment plan it is important that you contact your provider.   I have prescribed Azithromyin 250 mg: two tablets now and then one tablet daily for 4 additonal days    In addition you may use A prescription cough medication called Tessalon Perles 100mg. You may take 1-2 capsules every 8 hours as needed for your cough.   From your responses in the eVisit questionnaire you describe inflammation in the upper respiratory tract which is causing a significant cough.  This is commonly called Bronchitis and has four common causes:    Allergies  Viral Infections  Acid Reflux  Bacterial Infection Allergies, viruses and acid reflux are treated by controlling symptoms or eliminating the cause. An example might be a cough caused by taking certain blood pressure medications. You stop the cough by changing the medication. Another example might be a cough caused by acid reflux. Controlling the reflux helps control the cough.  USE OF BRONCHODILATOR ("RESCUE") INHALERS: There is a risk from using your bronchodilator too frequently.  The risk is that over-reliance on a medication which only relaxes the muscles surrounding the breathing tubes can reduce the effectiveness of medications prescribed to reduce swelling and congestion of the tubes themselves.  Although you feel brief relief from the bronchodilator inhaler, your asthma may actually be worsening with the tubes becoming more swollen and filled with mucus.  This can delay other crucial treatments, such as oral steroid medications. If you need to use a  bronchodilator inhaler daily, several times per day, you should discuss this with your provider.  There are probably better treatments that could be used to keep your asthma under control.     HOME CARE . Only take medications as instructed by your medical team. . Complete the entire course of an antibiotic. . Drink plenty of fluids and get plenty of rest. . Avoid close contacts especially the very young and the elderly . Cover your mouth if you cough or cough into your sleeve. . Always remember to wash your hands . A steam or ultrasonic humidifier can help congestion.   GET HELP RIGHT AWAY IF: . You develop worsening fever. . You become short of breath . You cough up blood. . Your symptoms persist after you have completed your treatment plan MAKE SURE YOU   Understand these instructions.  Will watch your condition.  Will get help right away if you are not doing well or get worse.  Your e-visit answers were reviewed by a board certified advanced clinical practitioner to complete your personal care plan.  Depending on the condition, your plan could have included both over the counter or prescription medications. If there is a problem please reply  once you have received a response from your provider. Your safety is important to us.  If you have drug allergies check your prescription carefully.    You can use MyChart to ask questions about today's visit, request a non-urgent call back, or ask for a work or school excuse for 24 hours related to this e-Visit. If it has been   been greater than 24 hours you will need to follow up with your provider, or enter a new e-Visit to address those concerns. You will get an e-mail in the next two days asking about your experience.  I hope that your e-visit has been valuable and will speed your recovery. Thank you for using e-visits.  I have spent 7 minutes in review of this chart- Lacy Duverney [facility]

## 2018-09-24 NOTE — Telephone Encounter (Signed)
FYI Patient called stating that she usually sees Webb Silversmith NP, but saw Dr. Einar Pheasant last week. Patient stated that she now has a bad sore throat and just finished up an e-visit and she was prescribed an antibiotic and cough pills. Patient stated that she wasn't sure about taking the medication and wondered if she actually needed an appointment. Advised patient that the medication should help her symptoms. Patient stated that she would just go ahead and start the medication and if not better in a few days would call back for another appointment.

## 2018-09-25 NOTE — Telephone Encounter (Signed)
Agree with plan 

## 2018-09-27 ENCOUNTER — Telehealth: Payer: Self-pay | Admitting: *Deleted

## 2018-09-27 NOTE — Telephone Encounter (Signed)
Spoke to pt who states she was seen at an Evisit and advised to contact office with an update. Pt states she has one day left on her zpak and has had no improvement and is wanting to know how to proceed or if she is needing to be seen again. pls advise

## 2018-09-27 NOTE — Telephone Encounter (Signed)
Noted  

## 2018-09-27 NOTE — Telephone Encounter (Signed)
She needs to make an appt for reevaluation.

## 2018-09-27 NOTE — Telephone Encounter (Signed)
Pt has appt scheduled with Anda Kraft 09/28/18 at 7:40am

## 2018-09-28 ENCOUNTER — Ambulatory Visit (INDEPENDENT_AMBULATORY_CARE_PROVIDER_SITE_OTHER): Payer: No Typology Code available for payment source | Admitting: Primary Care

## 2018-09-28 ENCOUNTER — Encounter: Payer: Self-pay | Admitting: Primary Care

## 2018-09-28 VITALS — BP 116/82 | HR 69 | Temp 98.3°F | Ht 63.0 in | Wt 180.8 lb

## 2018-09-28 DIAGNOSIS — H65193 Other acute nonsuppurative otitis media, bilateral: Secondary | ICD-10-CM

## 2018-09-28 NOTE — Progress Notes (Signed)
Subjective:    Patient ID: Vanessa Byrd, female    DOB: [birth year], 43 y.o.   MRN: [id number]  HPI  Vanessa Byrd is a 43 year old female with a history of allergic rhinitis who presents today with a chief complaint of ear fullness.  She also reports cough. She was evaluated through an e-vitis on 09/24/18 for same symptoms. She was prescribed azithromycin 250 mg tablets and Tessalon Perles. Also evaluated on 09/19/18 per Dr. Einar Pheasant with same symptoms. Diagnosed with viral URI and was told to start antihistamine, Flonase, Neti-Pot rinses.   Since her last visit with Dr. Einar Pheasant her symptoms didn't improve, mostly cough and sore throat with ear fullness. Since her e-visit she took her last dose of azithromycin today. She denies fevers. She's using her Flonase daily, no recent use of antihistamine. Overall her cough has improved.   Review of Systems  Constitutional: Negative for chills and fever.  HENT: Negative for congestion, ear pain and sore throat.        Ear fullness  Respiratory: Positive for cough. Negative for shortness of breath.        Past Medical History:  Diagnosis Date  . Colitis   . Esophagitis   . H. pylori infection   . Sinusitis      Social History   Socioeconomic History  . Marital status: Married    Spouse name: Not on file  . Number of children: Not on file  . Years of education: Not on file  . Highest education level: Not on file  Occupational History  . Not on file  Social Needs  . Financial resource strain: Not on file  . Food insecurity:    Worry: Not on file    Inability: Not on file  . Transportation needs:    Medical: Not on file    Non-medical: Not on file  Tobacco Use  . Smoking status: Never Smoker  . Smokeless tobacco: Never Used  Substance and Sexual Activity  . Alcohol use: No  . Drug use: No  . Sexual activity: Not on file  Lifestyle  . Physical activity:    Days per week: Not on file    Minutes per session: Not on file  .  Stress: Not on file  Relationships  . Social connections:    Talks on phone: Not on file    Gets together: Not on file    Attends religious service: Not on file    Active member of club or organization: Not on file    Attends meetings of clubs or organizations: Not on file    Relationship status: Not on file  . Intimate partner violence:    Fear of current or ex partner: Not on file    Emotionally abused: Not on file    Physically abused: Not on file    Forced sexual activity: Not on file  Other Topics Concern  . Not on file  Social History Narrative  . Not on file    Past Surgical History:  Procedure Laterality Date  . TONSILLECTOMY    . WISDOM TOOTH EXTRACTION      Family History  Problem Relation Age of Onset  . GER disease Mother   . Colon cancer Neg Hx   . Esophageal cancer Neg Hx   . Inflammatory bowel disease Neg Hx   . Liver disease Neg Hx   . Pancreatic cancer Neg Hx   . Rectal cancer Neg Hx   . Stomach cancer  Neg Hx     Allergies  Allergen Reactions  . Amoxicillin     REACTION: swelling. angioedema    Current Outpatient Medications on File Prior to Visit  Medication Sig Dispense Refill  . Ferrous Gluconate (IRON 27 PO) iron    . FLUoxetine (PROZAC) 10 MG tablet Take 1 tablet (10 mg total) by mouth daily. 30 tablet 2  . fluticasone (FLONASE) 50 MCG/ACT nasal spray Place 2 sprays into both nostrils daily. 16 g 0  . Multiple Vitamin (MULTIVITAMIN) tablet Take 1 tablet by mouth daily.    Marland Kitchen omeprazole (PRILOSEC) 40 MG capsule Take 1 capsule (40 mg total) by mouth 2 (two) times daily. 60 capsule 3   No current facility-administered medications on file prior to visit.     BP 116/82   Pulse 69   Temp 98.3 F (36.8 C) (Oral)   Ht 5\' 3"  (1.6 m)   Wt 180 lb 12 oz (82 kg)   SpO2 99%   BMI 32.02 kg/m    Objective:   Physical Exam  Constitutional: She appears well-nourished. She does not appear ill.  HENT:  Right Ear: Ear canal normal. Tympanic  membrane is not erythematous and not bulging.  Left Ear: Ear canal normal. Tympanic membrane is bulging. Tympanic membrane is not erythematous.  Nose: No mucosal edema. Right sinus exhibits no maxillary sinus tenderness and no frontal sinus tenderness. Left sinus exhibits no maxillary sinus tenderness and no frontal sinus tenderness.  Mouth/Throat: Oropharynx is clear and moist.  TM's clear but with bilateral effusion. No erythema.  Neck: Neck supple.  Cardiovascular: Normal rate and regular rhythm.  Respiratory: Effort normal and breath sounds normal. She has no wheezes.  Skin: Skin is warm and dry.           Assessment & Plan:  Ear Effusion:  Ear fullness for the last week, also treated recently with Zpak per E-visit. Exam today overall unremarkable with the exception of ear effusion. Discussed regular use of Flonase and antihistamine. Ibuprofen or Tylenol PRN. Follow up PRN.  Pleas Koch, NP

## 2018-09-28 NOTE — Patient Instructions (Signed)
You do not have an ear infection.  Ear Pressure: Try using Flonase (fluticasone) nasal spray. Instill 1 spray in each nostril twice daily.   Resume your antihistamine daily as discussed.  It was a pleasure meeting you!

## 2018-10-11 ENCOUNTER — Ambulatory Visit (INDEPENDENT_AMBULATORY_CARE_PROVIDER_SITE_OTHER): Payer: No Typology Code available for payment source | Admitting: Internal Medicine

## 2018-10-11 VITALS — BP 116/78 | HR 68 | Temp 98.5°F | Wt 179.0 lb

## 2018-10-11 DIAGNOSIS — J329 Chronic sinusitis, unspecified: Secondary | ICD-10-CM

## 2018-10-11 DIAGNOSIS — M79672 Pain in left foot: Secondary | ICD-10-CM

## 2018-10-11 DIAGNOSIS — B9789 Other viral agents as the cause of diseases classified elsewhere: Secondary | ICD-10-CM

## 2018-10-11 MED ORDER — PREDNISONE 10 MG PO TABS: ORAL_TABLET | ORAL | 0 refills | Status: DC

## 2018-10-11 MED FILL — predniSONE 10 MG TABS: 10 | 6 days supply | Qty: 21 | Fill #0

## 2018-10-13 ENCOUNTER — Encounter: Payer: Self-pay | Admitting: Internal Medicine

## 2018-10-13 NOTE — Progress Notes (Signed)
HPI  Pt presents to the clinic today with c/o ear fullness, runny nose,  nasal congestion and sore throat.  She reports this started 3 to 4 days ago.  She denies ear pain, drainage or decreased hearing.  She is blowing yellow mucus out of her nose.  She denies difficulty swallowing.  She denies headache, cough or shortness of breath.  She denies fever, chills or body aches.  She has tried Flonase, DayQuil and NyQuil without any relief.  She has no history of allergies or asthma.  She has not had sick contacts that she is aware of.  She also complains of pain in the arch of her left foot. She describes the pain as sore and achy.  She reports it is bad in the morning but actually gets worse throughout the day as she is standing and walking.  She denies numbness, tingling or weakness.  She denies any injury to the area.  She has tried Ibuprofen with minimal relief.  Review of Systems      Past Medical History:  Diagnosis Date  . Colitis   . Esophagitis   . H. pylori infection   . Sinusitis     Family History  Problem Relation Age of Onset  . GER disease Mother   . Colon cancer Neg Hx   . Esophageal cancer Neg Hx   . Inflammatory bowel disease Neg Hx   . Liver disease Neg Hx   . Pancreatic cancer Neg Hx   . Rectal cancer Neg Hx   . Stomach cancer Neg Hx     Social History   Socioeconomic History  . Marital status: Married    Spouse name: Not on file  . Number of children: Not on file  . Years of education: Not on file  . Highest education level: Not on file  Occupational History  . Not on file  Social Needs  . Financial resource strain: Not on file  . Food insecurity:    Worry: Not on file    Inability: Not on file  . Transportation needs:    Medical: Not on file    Non-medical: Not on file  Tobacco Use  . Smoking status: Never Smoker  . Smokeless tobacco: Never Used  Substance and Sexual Activity  . Alcohol use: No  . Drug use: No  . Sexual activity: Not on file   Lifestyle  . Physical activity:    Days per week: Not on file    Minutes per session: Not on file  . Stress: Not on file  Relationships  . Social connections:    Talks on phone: Not on file    Gets together: Not on file    Attends religious service: Not on file    Active member of club or organization: Not on file    Attends meetings of clubs or organizations: Not on file    Relationship status: Not on file  . Intimate partner violence:    Fear of current or ex partner: Not on file    Emotionally abused: Not on file    Physically abused: Not on file    Forced sexual activity: Not on file  Other Topics Concern  . Not on file  Social History Narrative  . Not on file    Allergies  Allergen Reactions  . Amoxicillin     REACTION: swelling. angioedema     Constitutional:  Denies headache, fatigue, fever or abrupt weight changes.  HEENT:  Positive ear fullness, runny  nose, nasal congestion and sore throat. Denies eye redness, eye pain, pressure behind the eyes, facial pain, ear pain, ringing in the ears, wax buildup, or bloody nose. Respiratory: Denies cough, difficulty breathing or shortness of breath.  Cardiovascular: Denies chest pain, chest tightness, palpitations or swelling in the hands or feet.  Musculoskeletal: Patient reports left foot pain.  Denies decrease in range of motion, joint swelling, muscle pain or difficulty with gait.  No other specific complaints in a complete review of systems (except as listed in HPI above).  Objective:   BP 116/78   Pulse 68   Temp 98.5 F (36.9 C) (Oral)   Wt 179 lb (81.2 kg)   SpO2 98%   BMI 31.71 kg/m  Wt Readings from Last 3 Encounters:  10/11/18 179 lb (81.2 kg)  09/28/18 180 lb 12 oz (82 kg)  09/19/18 181 lb 8 oz (82.3 kg)     General: Appears her stated age, well developed, well nourished in NAD. HEENT: Head: normal shape and size, maxillary sinus tenderness noted;  Ears: Tm's gray and intact, normal light reflex;  Nose: mucosa pink and moist, septum midline; Throat/Mouth: + PND. Teeth present, mucosa erythematous and moist, no exudate noted, no lesions or ulcerations noted.  Neck: No cervical lymphadenopathy.  Cardiovascular: Normal rate and rhythm. S1,S2 noted.  No murmur, rubs or gallops noted.  Pulmonary/Chest: Normal effort and positive vesicular breath sounds. No respiratory distress. No wheezes, rales or ronchi noted.  MSK: Normal flexion, extension and rotation of the left foot.  No pain with palpation over the left heel.  Mild discomfort noted with palpation of the left arch.  No joint swelling noted.  No difficulty with gait.     Assessment & Plan:   Viral Sinusitis:  Get some rest and drink plenty of water Can use a Nettie pot which can be purchased from [employer] Rx for Pred taper x6 days, avoid OTC NSAIDs No indication for antibiotics at this time  Pain in Left Foot:  Still possibly plantar fasciitis Rx for Pred taper x6 days, avoid OTC NSAIDs If no improvement, let me know  RTC as needed or if symptoms persist.   Webb Silversmith, NP

## 2018-10-13 NOTE — Patient Instructions (Signed)

## 2018-10-19 ENCOUNTER — Encounter: Payer: Self-pay | Admitting: Internal Medicine

## 2018-11-12 ENCOUNTER — Ambulatory Visit (HOSPITAL_COMMUNITY)
Admission: EM | Admit: 2018-11-12 | Discharge: 2018-11-12 | Disposition: A | Discharge status: Home / Self Care | Payer: No Typology Code available for payment source | Attending: Family Medicine | Admitting: Family Medicine

## 2018-11-12 ENCOUNTER — Encounter (HOSPITAL_COMMUNITY): Payer: Self-pay

## 2018-11-12 ENCOUNTER — Other Ambulatory Visit: Payer: Self-pay

## 2018-11-12 ENCOUNTER — Encounter: Payer: Self-pay | Admitting: Internal Medicine

## 2018-11-12 DIAGNOSIS — M546 Pain in thoracic spine: Secondary | ICD-10-CM

## 2018-11-12 LAB — POCT URINALYSIS DIP (DEVICE)
Bilirubin Urine: NEGATIVE
Glucose, UA: NEGATIVE mg/dL
Hgb urine dipstick: NEGATIVE
Ketones, ur: NEGATIVE mg/dL
Leukocytes,Ua: NEGATIVE
Nitrite: NEGATIVE
PROTEIN: NEGATIVE mg/dL
Specific Gravity, Urine: >=1.03 (ref 1.005–1.030)
UROBILINOGEN UA: 0.2 mg/dL (ref 0.0–1.0)
pH: 5.5 (ref 5.0–8.0)

## 2018-11-12 MED ORDER — NAPROXEN 500 MG PO TABS: 500.0000 mg | ORAL_TABLET | Freq: Two times a day (BID) | ORAL | 0 refills | Status: DC

## 2018-11-12 MED FILL — NAPROXEN 500 MG TABLET: 500 | 15 days supply | Qty: 30 | Fill #0

## 2018-11-12 NOTE — Discharge Instructions (Addendum)
Your urine is completely normal today Your pain is reproducible with movement and touch, which is concerning for muscular pain.  Anti-inflammatory, naproxen, twice a day to help with pain.  If pain becomes severe, develop fevers, or otherwise worsening I would recommend going to the ER for further evaluation of this.

## 2018-11-12 NOTE — Telephone Encounter (Signed)
See my chart message. Patient left a voicemail stating that she sent you a mychart message. See EMR and patient's visit to ED.

## 2018-11-12 NOTE — ED Provider Notes (Signed)
[place]    CSN: [id number] Arrival date & time: 11/12/18  1410     History   Chief Complaint Chief Complaint  Patient presents with  . Appointment  . (2:00 pm Left Flank Pain)    HPI Vanessa Byrd is a 43 y.o. female.   Vanessa Byrd presents with complaints of left mid back pain. Waxes and wanes. Present for the past week but worse today. No fever. No vomiting, mild nausea when she smelled her husbands food. No urinary symptoms. Denies  Any previous similar. No blood in urine. No specific trigger or relieving factor. No known injury. Pain 8/10 and then took ibuprofen and now is 6/10. Throbbing and dull ache. No numbness tingling or weakness. No chest pain . Hx of colitis and esophagitis.     ROS per HPI, negative if not otherwise mentioned.      Past Medical History:  Diagnosis Date  . Colitis   . Esophagitis   . H. pylori infection   . Sinusitis     Patient Active Problem List   Diagnosis Date Noted  . Iron deficiency 07/19/2018  . Gastroesophageal reflux disease with esophagitis 06/13/2018  . Anxiety and depression 05/31/2013  . ALLERGIC RHINITIS 11/24/2009    Past Surgical History:  Procedure Laterality Date  . TONSILLECTOMY    . WISDOM TOOTH EXTRACTION      OB History   No obstetric history on file.      Home Medications    Prior to Admission medications   Medication Sig Start Date End Date Taking? Authorizing Provider  Ferrous Gluconate (IRON 27 PO) iron    [provider]  FLUoxetine (PROZAC) 10 MG tablet Take 1 tablet (10 mg total) by mouth daily. 05/14/18   Jearld Fenton, NP  fluticasone (FLONASE) 50 MCG/ACT nasal spray Place 2 sprays into both nostrils daily. 09/17/17   Tasia Catchings, Amy V, PA-C  Multiple Vitamin (MULTIVITAMIN) tablet Take 1 tablet by mouth daily.    [provider]  naproxen (NAPROSYN) 500 MG tablet Take 1 tablet (500 mg total) by mouth 2 (two) times daily. 11/12/18   Zigmund Gottron, NP  omeprazole  (PRILOSEC) 40 MG capsule Take 1 capsule (40 mg total) by mouth 2 (two) times daily. 06/13/18   Mansouraty, Telford Nab., MD  predniSONE (DELTASONE) 10 MG tablet Take 6 tabs day 1, 5 tabs day 2, 4 tabs day 3, 3 tabs day 4, 2 tabs day 5, 1 tab day 6 10/11/18   Jearld Fenton, NP    Family History Family History  Problem Relation Age of Onset  . GER disease Mother   . Colon cancer Neg Hx   . Esophageal cancer Neg Hx   . Inflammatory bowel disease Neg Hx   . Liver disease Neg Hx   . Pancreatic cancer Neg Hx   . Rectal cancer Neg Hx   . Stomach cancer Neg Hx     Social History Social History   Tobacco Use  . Smoking status: Never Smoker  . Smokeless tobacco: Never Used  Substance Use Topics  . Alcohol use: No  . Drug use: No     Allergies   Amoxicillin   Review of Systems Review of Systems   Physical Exam Triage Vital Signs ED Triage Vitals  Enc Vitals Group     BP 11/12/18 1429 125/80     Pulse Rate 11/12/18 1429 69     Resp 11/12/18 1429 18     Temp 11/12/18  1429 98.2 F (36.8 C)     Temp Source 11/12/18 1429 Oral     SpO2 11/12/18 1429 96 %     Weight --      Height --      Head Circumference --      Peak Flow --      Pain Score 11/12/18 1428 6     Pain Loc --      Pain Edu? --      Excl. in [place]? --    No data found.  Updated Vital Signs BP 125/80 (BP Location: Left Arm)   Pulse 69   Temp 98.2 F (36.8 C) (Oral)   Resp 18   SpO2 96%    Physical Exam Constitutional:      General: She is not in acute distress.    Appearance: She is well-developed.  Cardiovascular:     Rate and Rhythm: Normal rate and regular rhythm.     Heart sounds: Normal heart sounds.  Pulmonary:     Effort: Pulmonary effort is normal.     Breath sounds: Normal breath sounds.  Abdominal:     General: Abdomen is flat. There is no distension.     Palpations: There is no mass.     Tenderness: There is left CVA tenderness. There is no rebound.     Hernia: No hernia is  present.  Musculoskeletal:     Thoracic back: She exhibits tenderness and pain. She exhibits no bony tenderness, no swelling, no edema, no deformity and no laceration.       Back:     Comments: cva tenderness but also with muscular pain on palpation and touch to left back; full ROm of upper and lower extremities; patient does brace herself with movements as if this worsens pain although she states it feels the same with movement.   Skin:    General: Skin is warm and dry.  Neurological:     Mental Status: She is alert and oriented to person, place, and time.      UC Treatments / Results  Labs (all labs ordered are listed, but only abnormal results are displayed) Labs Reviewed  POCT URINALYSIS DIP (DEVICE)    EKG None  Radiology No results found.  Procedures Procedures (including critical care time)  Medications Ordered in UC Medications - No data to display  Initial Impression / Assessment and Plan / UC Course  I have reviewed the triage vital signs and the nursing notes.  Pertinent labs & imaging results that were available during my care of the patient were reviewed by me and considered in my medical decision making (see chart for details).    Pain at rest but worsened and reproducible on palpation to left mid back musculature, with normal urine. No urine symptoms. No fevers. Ibuprofen did help some. Declined toradol in clinic. Suspect musculoskeletal in nature, considered pyelo, kidney stone, early shingles or radiation of abdominal pain. Return precautions provided. Patient verbalized understanding and agreeable to plan.  Ambulatory out of clinic without difficulty.   Final Clinical Impressions(s) / UC Diagnoses   Final diagnoses:  Acute left-sided thoracic back pain     Discharge Instructions     Your urine is completely normal today Your pain is reproducible with movement and touch, which is concerning for muscular pain.  Anti-inflammatory, naproxen, twice a  day to help with pain.  If pain becomes severe, develop fevers, or otherwise worsening I would recommend going to the ER for further evaluation  of this.    ED Prescriptions    Medication Sig Dispense Auth. Provider   naproxen (NAPROSYN) 500 MG tablet Take 1 tablet (500 mg total) by mouth 2 (two) times daily. 30 tablet Zigmund Gottron, NP     Controlled Substance Prescriptions [place] Controlled Substance Registry consulted? Not Applicable   Zigmund Gottron, NP 11/12/18 1718

## 2018-11-12 NOTE — ED Triage Notes (Signed)
Pt presents with left flank pain for over a week.

## 2018-12-28 ENCOUNTER — Telehealth: Payer: Self-pay

## 2018-12-28 MED FILL — MEDROXYPROGESTERONE 2.5 MG: 2.5 | 90 days supply | Qty: 90 | Fill #0

## 2018-12-28 MED FILL — ESTRADIOL 0.5 MG TABS: 0.5 | 90 days supply | Qty: 90 | Fill #0

## 2018-12-28 NOTE — Telephone Encounter (Signed)
Copied from [place] [id number]. Topic: General - Other >> Dec 27, 2018  4:18 PM Leward Quan A wrote: Reason for CRM: Patient called need to schedule an appointment so she can get a refill on her medication. Pleas call Ph# [phone]

## 2019-01-02 ENCOUNTER — Encounter: Payer: Self-pay | Admitting: Internal Medicine

## 2019-01-02 ENCOUNTER — Ambulatory Visit (INDEPENDENT_AMBULATORY_CARE_PROVIDER_SITE_OTHER): Payer: No Typology Code available for payment source | Admitting: Internal Medicine

## 2019-01-02 DIAGNOSIS — E611 Iron deficiency: Secondary | ICD-10-CM

## 2019-01-02 DIAGNOSIS — F419 Anxiety disorder, unspecified: Secondary | ICD-10-CM | POA: Diagnosis not present

## 2019-01-02 DIAGNOSIS — F32A Depression, unspecified: Secondary | ICD-10-CM

## 2019-01-02 DIAGNOSIS — F329 Major depressive disorder, single episode, unspecified: Secondary | ICD-10-CM

## 2019-01-02 DIAGNOSIS — K2100 Gastro-esophageal reflux disease with esophagitis, without bleeding: Secondary | ICD-10-CM

## 2019-01-02 MED ORDER — FLUOXETINE HCL 10 MG PO TABS: 10.0000 mg | ORAL_TABLET | Freq: Every day | ORAL | 2 refills | Status: DC

## 2019-01-02 MED FILL — FLUOXETINE HCL 10 MG TABS: 10 | 30 days supply | Qty: 30 | Fill #0

## 2019-01-02 NOTE — Assessment & Plan Note (Signed)
Support offered today RX for Fluoxetine sent to pharmacy She will continue marriage counseling at this time

## 2019-01-02 NOTE — Patient Instructions (Signed)

## 2019-01-02 NOTE — Progress Notes (Signed)
Virtual Visit via Video Note  I connected with Vanessa Byrd on 01/02/19 at  3:15 PM EDT by a video enabled telemedicine application and verified that I am speaking with the correct person using two identifiers.  Location: Patient: Home Provider: Office   I discussed the limitations of evaluation and management by telemedicine and the availability of in person appointments. The patient expressed understanding and agreed to proceed.  History of Present Illness:  Pt due for follow up of chronic conditions.  Anxiety and Depression: Deteriorated due to stress from the pandemic and marital troubles. She is seeing a marriage Social worker. She stopped the Fluoxetine but feels like she needs to restart it. She does not follow with psychiatry. She denies SI/HI.   Iron Deficiency Anemia: Her last H/H was 14.7/43.5 05/2018. She is taking an iron supplement as prescribed. She denies constipation, s/s of bleeding, fatigue, cold intolerance or SOB.  GERD: Stable. She takes Omeprazole as needed with good relief. Upper GI from 08/2018 reviewed.    Past Medical History:  Diagnosis Date  . Colitis   . Esophagitis   . H. pylori infection   . Sinusitis     Current Outpatient Medications  Medication Sig Dispense Refill  . Ferrous Gluconate (IRON 27 PO) iron    . FLUoxetine (PROZAC) 10 MG tablet Take 1 tablet (10 mg total) by mouth daily. 30 tablet 2  . fluticasone (FLONASE) 50 MCG/ACT nasal spray Place 2 sprays into both nostrils daily. 16 g 0  . Multiple Vitamin (MULTIVITAMIN) tablet Take 1 tablet by mouth daily.    . naproxen (NAPROSYN) 500 MG tablet Take 1 tablet (500 mg total) by mouth 2 (two) times daily. 30 tablet 0  . omeprazole (PRILOSEC) 40 MG capsule Take 1 capsule (40 mg total) by mouth 2 (two) times daily. 60 capsule 3  . predniSONE (DELTASONE) 10 MG tablet Take 6 tabs day 1, 5 tabs day 2, 4 tabs day 3, 3 tabs day 4, 2 tabs day 5, 1 tab day 6 21 tablet 0   No current  facility-administered medications for this visit.     Allergies  Allergen Reactions  . Amoxicillin     REACTION: swelling. angioedema    Family History  Problem Relation Age of Onset  . GER disease Mother   . Colon cancer Neg Hx   . Esophageal cancer Neg Hx   . Inflammatory bowel disease Neg Hx   . Liver disease Neg Hx   . Pancreatic cancer Neg Hx   . Rectal cancer Neg Hx   . Stomach cancer Neg Hx     Social History   Socioeconomic History  . Marital status: Married    Spouse name: Not on file  . Number of children: Not on file  . Years of education: Not on file  . Highest education level: Not on file  Occupational History  . Not on file  Social Needs  . Financial resource strain: Not on file  . Food insecurity:    Worry: Not on file    Inability: Not on file  . Transportation needs:    Medical: Not on file    Non-medical: Not on file  Tobacco Use  . Smoking status: Never Smoker  . Smokeless tobacco: Never Used  Substance and Sexual Activity  . Alcohol use: No  . Drug use: No  . Sexual activity: Not on file  Lifestyle  . Physical activity:    Days per week: Not on file  Minutes per session: Not on file  . Stress: Not on file  Relationships  . Social connections:    Talks on phone: Not on file    Gets together: Not on file    Attends religious service: Not on file    Active member of club or organization: Not on file    Attends meetings of clubs or organizations: Not on file    Relationship status: Not on file  . Intimate partner violence:    Fear of current or ex partner: Not on file    Emotionally abused: Not on file    Physically abused: Not on file    Forced sexual activity: Not on file  Other Topics Concern  . Not on file  Social History Narrative  . Not on file     Constitutional: Denies fever, malaise, fatigue, headache or abrupt weight changes.  Respiratory: Denies difficulty breathing, shortness of breath, cough or sputum production.    Cardiovascular: Denies chest pain, chest tightness, palpitations or swelling in the hands or feet.  Gastrointestinal: Pt reports intermittent reflux. Denies abdominal pain, bloating, constipation, diarrhea or blood in the stool.  Neurological: Denies dizziness, difficulty with memory, difficulty with speech or problems with balance and coordination.  Psych: Pt has a history of anxiety and depression. Denies SI/HI.  No other specific complaints in a complete review of systems (except as listed in HPI above).   Wt Readings from Last 3 Encounters:  10/11/18 179 lb (81.2 kg)  09/28/18 180 lb 12 oz (82 kg)  09/19/18 181 lb 8 oz (82.3 kg)    General: Appears her stated age, obese, in NAD. Skin: Warm, dry and intact.  HEENT: Head: normal shape and size;  Pulmonary/Chest: Normal effort. No respiratory distress.  Neurological: Alert and oriented.  Psychiatric: Mood and affect normal. Behavior is normal. Judgment and thought content normal.     BMET    Component Value Date/Time   NA 138 12/25/2017 0025   K 3.4 (L) 12/25/2017 0025   CL 102 12/25/2017 0025   CO2 27 12/25/2017 0025   GLUCOSE 109 (H) 12/25/2017 0025   BUN 6 12/25/2017 0025   CREATININE 0.74 12/25/2017 0025   CALCIUM 9.2 12/25/2017 0025   GFRNONAA >60 12/25/2017 0025   GFRAA >60 12/25/2017 0025    Lipid Panel  No results found for: CHOL, TRIG, HDL, CHOLHDL, VLDL, LDLCALC  CBC    Component Value Date/Time   WBC 6.8 06/13/2018 0934   RBC 5.16 (H) 06/13/2018 0934   HGB 14.7 06/13/2018 0934   HCT 43.5 06/13/2018 0934   PLT 275.0 06/13/2018 0934   MCV 84.2 06/13/2018 0934   MCH 29.7 12/25/2017 0025   MCHC 33.9 06/13/2018 0934   RDW 14.0 06/13/2018 0934   LYMPHSABS 3.0 06/13/2018 0934   MONOABS 0.3 06/13/2018 0934   EOSABS 0.1 06/13/2018 0934   BASOSABS 0.0 06/13/2018 0934    Hgb A1C No results found for: HGBA1C       Assessment and Plan:  See problem based charting  Follow Up Instructions:    I  discussed the assessment and treatment plan with the patient. The patient was provided an opportunity to ask questions and all were answered. The patient agreed with the plan and demonstrated an understanding of the instructions.   The patient was advised to call back or seek an in-person evaluation if the symptoms worsen or if the condition fails to improve as anticipated.     Webb Silversmith, NP

## 2019-01-02 NOTE — Assessment & Plan Note (Signed)
CBC reviewed Continue iron supplement  Will monitor

## 2019-01-02 NOTE — Assessment & Plan Note (Signed)
Continue Omeprazole as needed CBC and CMET reviewed

## 2019-01-08 ENCOUNTER — Encounter: Payer: Self-pay | Admitting: Gastroenterology

## 2019-02-19 MED FILL — FLUOXETINE HCL 10 MG TABS: 10 | 30 days supply | Qty: 30 | Fill #1

## 2019-03-04 ENCOUNTER — Telehealth: Payer: Self-pay

## 2019-03-04 NOTE — Telephone Encounter (Signed)
Patient called back and stated that she is doing much better today.  She stated that she ended up getting sick yesterday and felt much better afterwards.

## 2019-03-04 NOTE — Telephone Encounter (Signed)
[facility] Night - Client TELEPHONE ADVICE RECORD [facility] Patient Name: Vanessa Byrd Gender: Female DOB: [birth year] Age: 43 Y 32 M 13 D Return Phone Number: [phone] (Primary), [phone] (Secondary) [place] [address] Client [facility] Night - Client Client Site [place] Physician Webb Silversmith - NP Contact Type Call Who Is Calling Patient / Member / Family / Caregiver Call Type Triage / Clinical Caller Name Illyria Sobocinski Relationship To Patient Spouse Return Phone Number [phone] (Primary) Chief Complaint OVERDOSE took too much medication at once Reason for Call Symptomatic / Request for Davis states his wife was taking her medication and then also took one of his pills instead. She is light headed and disoriented and nauseated with a headache. Translation No Nurse Assessment Nurse: Roda Shutters, RN, Jane Date/Time (Eastern Time): 03/02/2019 12:54:12 AM Confirm and document reason for call. If symptomatic, describe symptoms. ---Caller states th tp took her night meds and took one of her husband s medication: She feel light headed and is nauseated, disoriented, and has a headache - this happened about 3 hour ago Has the patient had close contact with a person known or suspected to have the novel coronavirus illness OR traveled / lives in area with major community spread (including international travel) in the last 14 days from the onset of symptoms? * If Asymptomatic, screen for exposure and travel within the last 14 days. ---No Does the patient have any new or worsening symptoms? ---Yes Will a triage be completed? ---Yes Related visit to physician within the last 2 weeks? ---No Does the PT have any chronic conditions? (i.e. diabetes, asthma, this includes High risk factors for pregnancy, etc.) ---No Is the patient  pregnant or possibly pregnant? (Ask all females between the ages of 17-55) ---No Is this a behavioral health or substance abuse call? ---No Guidelines Guideline Title Affirmed Question Affirmed Notes Nurse Date/Time (Eastern Time) Poisoning Difficult to awaken or acting confused (e.g., Gichingiri, RN, Opal Sidles 03/02/2019 12:59:23 AM PLEASE NOTE: All timestamps contained within this report are represented as [place] Standard Time. CONFIDENTIALTY NOTICE: This fax transmission is intended only for the addressee. It contains information that is legally privileged, confidential or otherwise protected from use or disclosure. If you are not the intended recipient, you are strictly prohibited from reviewing, disclosing, copying using or disseminating any of this information or taking any action in reliance on or regarding this information. If you have received this fax in error, please notify us immediately by telephone so that we can arrange for its return to [place]. Phone: [phone], Toll-Free: [phone], Fax: [phone] Page: 2 of 2 Call Id: [id number] Guidelines Guideline Title Affirmed Question Affirmed Notes Nurse Date/Time Eilene Ghazi Time) disoriented, slurred speech) Disp. Time Eilene Ghazi Time) Disposition Final User 03/02/2019 12:52:12 AM Send to Urgent Bubba Hales 03/02/2019 1:03:39 AM Send To RN Personal Roda Shutters, RN, Jane 03/02/2019 1:28:53 AM 911 Outcome Documentation Gichingiri, RN, Opal Sidles Reason: Osvaldo Human was given the 911 outcome and advised to call 911 now; triager called back and caller said the EMts were there now 03/02/2019 1:03:02 AM Call EMS 911 Now Yes Roda Shutters, RN, Irving Copas Disagree/Comply Comply Caller Understands Yes PreDisposition InappropriateToAsk Care Advice Given Per Guideline CALL EMS 911 NOW: * Immediate medical attention is needed. You need to hang up and call 911 (or an ambulance). CARE ADVICE given per Poisoning (Adult) guideline. * Triager Discretion: I'll  call you back in a few minutes  to be sure you were able to reach them. Comments User: Arnoldo Hooker, RN Date/Time Eilene Ghazi Time): 03/02/2019 12:58:23 AM She works for [employer] and she was tested for COVID and the results came in today and she is negative.

## 2019-03-04 NOTE — Telephone Encounter (Signed)
noted 

## 2019-03-04 NOTE — Telephone Encounter (Signed)
Spoke with patient's husband, he says she is back at work today and to call her on her cell if needed.  I did LM on her cell VM just to call us back with an update.   FYI to [employer] (CMA) and Garnette Gunner, (PCP).

## 2019-03-06 ENCOUNTER — Encounter: Payer: Self-pay | Admitting: Internal Medicine

## 2019-03-10 NOTE — Progress Notes (Signed)
Vanessa Cabral T. Nickalaus Crooke, MD Primary Care and [place] at [facility] [place], [address] Phone: [phone]  FAX: [place] - 43 y.o. female  MRN [id number]  Date of Birth: 1976-01-27  Visit Date: 03/11/2019  PCP: Jearld Fenton, NP  Referred by: Jearld Fenton, NP  Chief Complaint  Patient presents with  . Foot Pain    right   Subjective:   This 42 y.o. female patient presents with a 3 mo long history of heel pain, R >> L. This is notable for worsening pain first thing in the morning when arising and standing after sitting.   Prior foot or ankle fractures: none Prior operations: none Orthotics or bracing: none Medications: none PT or home rehab: none Night splints: no Ice massage: no Ball massage: no  Metatarsal pain: no  The PMH, PSH, Social History, Family History, Medications, and allergies have been reviewed in [facility], and have been updated if relevant.  Patient Active Problem List   Diagnosis Date Noted  . Iron deficiency 07/19/2018  . Gastroesophageal reflux disease with esophagitis 06/13/2018  . Anxiety and depression 05/31/2013  . ALLERGIC RHINITIS 11/24/2009    Past Medical History:  Diagnosis Date  . Colitis   . Esophagitis   . H. pylori infection   . Sinusitis     Past Surgical History:  Procedure Laterality Date  . TONSILLECTOMY    . WISDOM TOOTH EXTRACTION      Social History   Socioeconomic History  . Marital status: Married    Spouse name: Not on file  . Number of children: Not on file  . Years of education: Not on file  . Highest education level: Not on file  Occupational History  . Not on file  Social Needs  . Financial resource strain: Not on file  . Food insecurity    Worry: Not on file    Inability: Not on file  . Transportation needs    Medical: Not on file    Non-medical: Not on file  Tobacco Use  . Smoking status: Never Smoker  .  Smokeless tobacco: Never Used  Substance and Sexual Activity  . Alcohol use: No  . Drug use: No  . Sexual activity: Not on file  Lifestyle  . Physical activity    Days per week: Not on file    Minutes per session: Not on file  . Stress: Not on file  Relationships  . Social Herbalist on phone: Not on file    Gets together: Not on file    Attends religious service: Not on file    Active member of club or organization: Not on file    Attends meetings of clubs or organizations: Not on file    Relationship status: Not on file  . Intimate partner violence    Fear of current or ex partner: Not on file    Emotionally abused: Not on file    Physically abused: Not on file    Forced sexual activity: Not on file  Other Topics Concern  . Not on file  Social History Narrative  . Not on file    Family History  Problem Relation Age of Onset  . GER disease Mother   . Colon cancer Neg Hx   . Esophageal cancer Neg Hx   . Inflammatory bowel disease Neg Hx   . Liver disease Neg Hx   . Pancreatic  cancer Neg Hx   . Rectal cancer Neg Hx   . Stomach cancer Neg Hx     Allergies  Allergen Reactions  . Amoxicillin     REACTION: swelling. angioedema    Medication list reviewed and updated in full in [facility].  GEN: No fevers, chills. Nontoxic. Primarily MSK c/o today. MSK: Detailed in the HPI GI: tolerating PO intake without difficulty Neuro: No numbness, parasthesias, or tingling associated. Otherwise the pertinent positives of the ROS are noted above.   Objective:   Blood pressure 108/68, pulse 72, temperature 98.5 F (36.9 C), temperature source Oral, height 5\' 3"  (1.6 m), weight 188 lb 4 oz (85.4 kg).  GEN: Well-developed,well-nourished,in no acute distress; alert,appropriate and cooperative throughout examination HEENT: Normocephalic and atraumatic without obvious abnormalities. Ears, externally no deformities PULM: Breathing comfortably in no respiratory  distress EXT: No clubbing, cyanosis, or edema PSYCH: Normally interactive. Cooperative during the interview. Pleasant. Friendly and conversant. Not anxious or depressed appearing. Normal, full affect.  R foot Echymosis: no Edema: no ROM: full LE B Gait: heel toe, non-antalgic MT pain: no Callus pattern: none Lateral Mall: NT Medial Mall: NT Talus: NT Navicular: NT Calcaneous: NT Metatarsals: NT 5th MT: NT Phalanges: NT Achilles: NT Plantar Fascia: tender, medial along PF. Pain with forced dorsi Fat Pad: NT Peroneals: NT Post Tib: NT Great Toe: Nml motion Ant Drawer: neg Other foot breakdown: none Long arch: preserved Transverse arch: preserved Hindfoot breakdown: none Sensation: intact  Assessment and Plan:     ICD-10-CM   1. Plantar fascia syndrome  M72.2    >25 minutes spent in face to face time with patient, >50% spent in counselling or coordination of care   Anatomy reviewed. Stretching and rehab are critically important to the treatment of PF. Reviewed footwear. Rigid soles have been shown to help with PF.  Reviewed rehab of stretching and calf raises.  Reviewed rehab from [facility]  Could consider a corticosteroid injection if conservative treatment fails.  Patient Instructions  Please read handouts on Plantar Fascitis.  STRETCHING and Strengthening program critically important.  Strengthening on foot and calf muscles as seen in handout. Calf raises, 2 legged, then 1 legged. Foot massage with tennis ball. Ice massage.  Towel Scrunches: get a towel or hand towel, use toes to pick up and scrunch up the towel.  Marble pick-ups, practice picking up marbles with toes and placing into a cup  NEEDS TO BE DONE EVERY DAY  Recommended over the counter insoles. (Spenco or Hapad)  A rigid shoe with good arch support helps: Dansko (great), Jennet Maduro, Merrell No easily bendable shoes.   Tuli's heel cups      Follow-up: No  follow-ups on file.  No orders of the defined types were placed in this encounter.  No orders of the defined types were placed in this encounter.   Signed,  Maud Deed. Lonney Revak, MD   Patient's Medications  New Prescriptions   No medications on file  Previous Medications   ESTRADIOL (ESTRACE) 0.5 MG TABLET    estradiol 0.5 mg tablet  Take 1 tablet every day by oral route.   FERROUS GLUCONATE (IRON 27 PO)    iron   FLUOXETINE (PROZAC) 10 MG TABLET    Take 1 tablet (10 mg total) by mouth daily.   FLUTICASONE (FLONASE) 50 MCG/ACT NASAL SPRAY    Place 2 sprays into both nostrils daily.   MEDROXYPROGESTERONE (PROVERA) 2.5 MG TABLET  MULTIPLE VITAMIN (MULTIVITAMIN) TABLET    Take 1 tablet by mouth daily.   NAPROXEN (NAPROSYN) 500 MG TABLET    Take 1 tablet (500 mg total) by mouth 2 (two) times daily.   OMEPRAZOLE (PRILOSEC) 40 MG CAPSULE    Take 1 capsule (40 mg total) by mouth 2 (two) times daily.  Modified Medications   No medications on file  Discontinued Medications   No medications on file

## 2019-03-11 ENCOUNTER — Other Ambulatory Visit: Payer: Self-pay

## 2019-03-11 ENCOUNTER — Ambulatory Visit (INDEPENDENT_AMBULATORY_CARE_PROVIDER_SITE_OTHER): Payer: No Typology Code available for payment source | Admitting: Family Medicine

## 2019-03-11 ENCOUNTER — Encounter: Payer: Self-pay | Admitting: Family Medicine

## 2019-03-11 VITALS — BP 108/68 | HR 72 | Temp 98.5°F | Ht 63.0 in | Wt 188.2 lb

## 2019-03-11 DIAGNOSIS — M722 Plantar fascial fibromatosis: Secondary | ICD-10-CM | POA: Diagnosis not present

## 2019-03-11 NOTE — Patient Instructions (Signed)

## 2019-03-13 ENCOUNTER — Ambulatory Visit: Payer: No Typology Code available for payment source | Admitting: Family Medicine

## 2019-03-14 ENCOUNTER — Ambulatory Visit: Payer: No Typology Code available for payment source | Admitting: Internal Medicine

## 2019-06-26 ENCOUNTER — Encounter: Payer: Self-pay | Admitting: Internal Medicine

## 2019-06-26 DIAGNOSIS — F419 Anxiety disorder, unspecified: Secondary | ICD-10-CM

## 2019-06-26 DIAGNOSIS — F329 Major depressive disorder, single episode, unspecified: Secondary | ICD-10-CM

## 2019-06-26 MED ORDER — FLUOXETINE HCL 10 MG PO TABS: 10.0000 mg | ORAL_TABLET | Freq: Every day | ORAL | 0 refills | Status: DC

## 2019-06-26 MED FILL — FLUoxetine HCL 10 MG TABS: 10 | 30 days supply | Qty: 30 | Fill #0

## 2019-07-14 ENCOUNTER — Encounter: Payer: Self-pay | Admitting: Internal Medicine

## 2019-07-15 MED FILL — FLUoxetine HCL 10 MG TABS: 10 | 30 days supply | Qty: 30 | Fill #0

## 2019-08-01 ENCOUNTER — Encounter: Payer: Self-pay | Admitting: Internal Medicine

## 2019-08-02 NOTE — Telephone Encounter (Signed)
Agree with advice given

## 2019-08-05 ENCOUNTER — Other Ambulatory Visit: Payer: Self-pay

## 2019-08-05 ENCOUNTER — Ambulatory Visit (INDEPENDENT_AMBULATORY_CARE_PROVIDER_SITE_OTHER): Payer: No Typology Code available for payment source | Admitting: Internal Medicine

## 2019-08-05 ENCOUNTER — Encounter: Payer: Self-pay | Admitting: Internal Medicine

## 2019-08-05 VITALS — BP 114/78 | HR 73 | Temp 97.3°F | Wt 186.0 lb

## 2019-08-05 DIAGNOSIS — R519 Headache, unspecified: Secondary | ICD-10-CM | POA: Diagnosis not present

## 2019-08-05 DIAGNOSIS — R202 Paresthesia of skin: Secondary | ICD-10-CM | POA: Diagnosis not present

## 2019-08-05 MED ORDER — PREDNISONE 10 MG PO TABS: ORAL_TABLET | ORAL | 0 refills | Status: DC

## 2019-08-05 MED FILL — predniSONE 10 MG TABS: 10 | 6 days supply | Qty: 21 | Fill #0

## 2019-08-05 NOTE — Patient Instructions (Signed)
Trigeminal Neuralgia  Trigeminal neuralgia is a nerve disorder that causes severe pain on one side of the face. The pain may last from a few seconds to several minutes. The pain is usually only on one side of the face. Symptoms may occur for days, weeks, or months and then go away for months or years. The pain may return and be worse than before. What are the causes? This condition is caused by damage or pressure to a nerve in the head that is called the trigeminal nerve. An attack can be triggered by:  Talking.  Chewing.  Putting on makeup.  Washing your face.  Shaving your face.  Brushing your teeth.  Touching your face. What increases the risk? You are more likely to develop this condition if you:  Are 50 years of age or older.  Are female. What are the signs or symptoms? The main symptom of this condition is severe pain in the:  Jaw.  Lips.  Eyes.  Nose.  Scalp.  Forehead.  Face. The pain may be:  Intense.  Stabbing.  Electric.  Shock-like. How is this diagnosed? This condition is diagnosed with a physical exam. A CT scan or an MRI may be done to rule out other conditions that can cause facial pain. How is this treated? This condition may be treated with:  Avoiding the things that trigger your symptoms.  Taking prescription medicines (anticonvulsants).  Having surgery. This may be done in severe cases if other medical treatment does not provide relief.  Having procedures such as ablation, thermal, or radiation therapy. It may take up to one month for treatment to start relieving the pain. Follow these instructions at home: Managing pain  Learn as much as you can about how to manage your pain. Ask your health care provider if a pain specialist would be helpful.  Consider talking with a mental health care provider (psychologist) about how to cope with the pain.  Consider joining a pain support group. General instructions  Take  over-the-counter and prescription medicines only as told by your health care provider.  Avoid the things that trigger your symptoms. It may help to: ? Chew on the unaffected side of your mouth. ? Avoid touching your face. ? Avoid blasts of hot or cold air.  Follow your treatment plan as told by your health care provider. This may include: ? Cognitive or behavioral therapy. ? Gentle, regular exercise. ? Meditation or yoga. ? Aromatherapy.  Keep all follow-up visits as told by your health care provider. You may need to be monitored closely to make sure treatment is working well for you. Where to find more information  Facial Pain Association: fpa-support.org Contact a health care provider if:  Your medicine is not helping your symptoms.  You have side effects from the medicine used for treatment.  You develop new, unexplained symptoms, such as: ? Double vision. ? Facial weakness. ? Facial numbness. ? Changes in hearing or balance.  You feel depressed. Get help right away if:  Your pain is severe and is not getting better.  You develop suicidal thoughts. If you ever feel like you may hurt yourself or others, or have thoughts about taking your own life, get help right away. You can go to your nearest emergency department or call:  Your local emergency services (911 in the [place]  A suicide crisis helpline, such as the National Suicide Prevention Lifeline at 1-800-273-8255. This is open 24 hours a day. Summary  Trigeminal neuralgia is a   nerve disorder that causes severe pain on one side of the face. The pain may last from a few seconds to several minutes.  This condition is caused by damage or pressure to a nerve in the head that is called the trigeminal nerve.  Treatment may include avoiding the things that trigger your symptoms, taking medicines, or having surgery or procedures. It may take up to one month for treatment to start relieving the pain.  Avoid the things that  trigger your symptoms.  Keep all follow-up visits as told by your health care provider. You may need to be monitored closely to make sure treatment is working well for you. This information is not intended to replace advice given to you by your health care provider. Make sure you discuss any questions you have with your health care provider. Document Released: 08/12/2000 Document Revised: 07/02/2018 Document Reviewed: 07/02/2018 Elsevier Patient Education  2020 Elsevier Inc.  

## 2019-08-05 NOTE — Progress Notes (Signed)
HPI  Pt presents to the clinic today with c/o headache and tingling on the left side of her face. She noticed this 12 days ago. The headache is located in her forehead. She describes the pain as achy, pressure. She denies dizziness, visual changes, sensitivity to light or sound, nausea or vomiting. She reports some associated ear fullness and nasal congestion, but denies runny nose, ear pain, sore throat, loss of taste or smell, cough or SOB. She has not noticed a rash in the area. She denies fever, chills or body aches. She denies neck pain but reports when she tries to "crack" her neck on the left, she feels like something inside is preventing it. She has tried Tylenol with minimal relief. She has not had sick contacts or exposure to COVID that she is aware of.  Review of Systems     Past Medical History:  Diagnosis Date  . Colitis   . Esophagitis   . H. pylori infection   . Sinusitis     Family History  Problem Relation Age of Onset  . GER disease Mother   . Colon cancer Neg Hx   . Esophageal cancer Neg Hx   . Inflammatory bowel disease Neg Hx   . Liver disease Neg Hx   . Pancreatic cancer Neg Hx   . Rectal cancer Neg Hx   . Stomach cancer Neg Hx     Social History   Socioeconomic History  . Marital status: Married    Spouse name: Not on file  . Number of children: Not on file  . Years of education: Not on file  . Highest education level: Not on file  Occupational History  . Not on file  Social Needs  . Financial resource strain: Not on file  . Food insecurity    Worry: Not on file    Inability: Not on file  . Transportation needs    Medical: Not on file    Non-medical: Not on file  Tobacco Use  . Smoking status: Never Smoker  . Smokeless tobacco: Never Used  Substance and Sexual Activity  . Alcohol use: No  . Drug use: No  . Sexual activity: Not on file  Lifestyle  . Physical activity    Days per week: Not on file    Minutes per session: Not on file  .  Stress: Not on file  Relationships  . Social Herbalist on phone: Not on file    Gets together: Not on file    Attends religious service: Not on file    Active member of club or organization: Not on file    Attends meetings of clubs or organizations: Not on file    Relationship status: Not on file  . Intimate partner violence    Fear of current or ex partner: Not on file    Emotionally abused: Not on file    Physically abused: Not on file    Forced sexual activity: Not on file  Other Topics Concern  . Not on file  Social History Narrative  . Not on file    Allergies  Allergen Reactions  . Amoxicillin     REACTION: swelling. angioedema     Constitutional: Positive headache. Denies fatigue, fever or abrupt weight changes.  HEENT:  Positive nasal congestion and ear fullness. Denies eye redness, ear pain, ringing in the ears, wax buildup, runny nose or sore throat. Respiratory: Denies cough, difficulty breathing or shortness of breath.  Cardiovascular: Denies  chest pain, chest tightness, palpitations or swelling in the hands or feet.  Neuro: Denies dizziness, difficultly with speech, facial drooping or problems with balance or coordination.  No other specific complaints in a complete review of systems (except as listed in HPI above).  Objective:  BP 114/78   Pulse 73   Temp (!) 97.3 F (36.3 C) (Temporal)   Wt 186 lb (84.4 kg)   SpO2 98%   BMI 32.95 kg/m   General: Appears her stated age, obese, in NAD. HEENT: Head: normal shape and size, no sinus tenderness noted; Eyes: sclera white, no icterus, conjunctiva pink; Ears: Tm's gray and intact, normal light reflex, + serous effusion bilaterally; Nose: mucosa boggy and moist, septum midline; Throat/Mouth: Teeth present, mucosa pink and moist, no exudate noted, no lesions or ulcerations noted.  Neck:  No adenopathy noted.  Cardiovascular: Normal rate and rhythm. S1,S2 noted.  No murmur, rubs or gallops noted.   Pulmonary/Chest: Normal effort and positive vesicular breath sounds. No respiratory distress. No wheezes, rales or ronchi noted.  MSK: Normal flexion, extension and rotation of the cervical spine. No bony tenderness noted over the cervical spine. Neuro: Alert and oriented. CN II-XII grossly intact. Coordination normal.   Assessment & Plan:   Acute Headache, Paresthesia of Left Side Face:  DDX include sinusitis, trigeminal neuralgia, bell's palsy without the paralysis, herpes zoster without the rash. RX for Pred Taper x 6 days ER precautions discussed She declines xray of cervical spine at this time  RTC as needed or if symptoms persist. Webb Silversmith, NP This visit occurred during the SARS-CoV-2 public health emergency.  Safety protocols were in place, including screening questions prior to the visit, additional usage of staff PPE, and extensive cleaning of exam room while observing appropriate contact time as indicated for disinfecting solutions.

## 2019-08-13 ENCOUNTER — Encounter: Payer: Self-pay | Admitting: Internal Medicine

## 2019-09-25 ENCOUNTER — Encounter: Payer: Self-pay | Admitting: Internal Medicine

## 2019-10-01 ENCOUNTER — Encounter: Payer: Self-pay | Admitting: Internal Medicine

## 2019-10-29 ENCOUNTER — Encounter: Payer: Self-pay | Admitting: Internal Medicine

## 2019-10-31 ENCOUNTER — Other Ambulatory Visit: Payer: Self-pay

## 2019-10-31 ENCOUNTER — Encounter: Payer: Self-pay | Admitting: Internal Medicine

## 2019-10-31 ENCOUNTER — Ambulatory Visit (INDEPENDENT_AMBULATORY_CARE_PROVIDER_SITE_OTHER): Payer: No Typology Code available for payment source | Admitting: Internal Medicine

## 2019-10-31 ENCOUNTER — Ambulatory Visit (INDEPENDENT_AMBULATORY_CARE_PROVIDER_SITE_OTHER)
Admission: RE | Admit: 2019-10-31 | Discharge: 2019-10-31 | Disposition: A | Discharge status: Home / Self Care | Payer: No Typology Code available for payment source | Source: Ambulatory Visit | Attending: Internal Medicine | Admitting: Internal Medicine

## 2019-10-31 VITALS — BP 124/80 | HR 78 | Temp 97.4°F | Wt 192.0 lb

## 2019-10-31 DIAGNOSIS — M79641 Pain in right hand: Secondary | ICD-10-CM

## 2019-10-31 DIAGNOSIS — M79671 Pain in right foot: Secondary | ICD-10-CM

## 2019-10-31 DIAGNOSIS — M79642 Pain in left hand: Secondary | ICD-10-CM | POA: Diagnosis not present

## 2019-10-31 DIAGNOSIS — R222 Localized swelling, mass and lump, trunk: Secondary | ICD-10-CM

## 2019-10-31 DIAGNOSIS — M79672 Pain in left foot: Secondary | ICD-10-CM

## 2019-10-31 DIAGNOSIS — R2242 Localized swelling, mass and lump, left lower limb: Secondary | ICD-10-CM

## 2019-10-31 NOTE — Progress Notes (Signed)
Subjective:    Patient ID: Vanessa Byrd, female    DOB: 04/19/1976, 44 y.o.   MRN: [id number]  HPI  Pt presents to the clinic today with c/o joint pain, mainly in her wrists and ankles. This has been an ongoing issue but worse in the last few weeks. She describes the pain as achy and stiff. She has noticed some associated swelling. She denies numbness, tingling or weakness. She denies any injury to the area. She has taken some Ibuprofen with some relief. She had a negative autoimmune workup 04/2018. She has a family history of RA she thinks.  She also reports hot flashes and night sweats. She has not had a period in over a year but has had some spotting. Her GYN put her on Estrace and Provera, but that made her start spotting so she was advised to stop taking them.  She also reports some "knots" on her left lower leg and right upper back. These have been there for some time. They are nontender and she has not noticed any drainage from the area.  Review of Systems  Past Medical History:  Diagnosis Date  . Colitis   . Esophagitis   . H. pylori infection   . Sinusitis     Current Outpatient Medications  Medication Sig Dispense Refill  . estradiol (ESTRACE) 0.5 MG tablet estradiol 0.5 mg tablet  Take 1 tablet every day by oral route.    . Ferrous Gluconate (IRON 27 PO) iron    . FLUoxetine (PROZAC) 10 MG tablet Take 1 tablet (10 mg total) by mouth daily. 30 tablet 0  . fluticasone (FLONASE) 50 MCG/ACT nasal spray Place 2 sprays into both nostrils daily. 16 g 0  . medroxyPROGESTERone (PROVERA) 2.5 MG tablet     . Multiple Vitamin (MULTIVITAMIN) tablet Take 1 tablet by mouth daily.    . naproxen (NAPROSYN) 500 MG tablet Take 1 tablet (500 mg total) by mouth 2 (two) times daily. 30 tablet 0  . omeprazole (PRILOSEC) 40 MG capsule Take 1 capsule (40 mg total) by mouth 2 (two) times daily. 60 capsule 3  . predniSONE (DELTASONE) 10 MG tablet Take 6 tabs day 1, 5 tabs day 2, 4 tabs day  3, 3 tabs day 4, 2 tabs day 5, 1 tab day 6 21 tablet 0   No current facility-administered medications for this visit.    Allergies  Allergen Reactions  . Amoxicillin     REACTION: swelling. angioedema    Family History  Problem Relation Age of Onset  . GER disease Mother   . Colon cancer Neg Hx   . Esophageal cancer Neg Hx   . Inflammatory bowel disease Neg Hx   . Liver disease Neg Hx   . Pancreatic cancer Neg Hx   . Rectal cancer Neg Hx   . Stomach cancer Neg Hx     Social History   Socioeconomic History  . Marital status: Married    Spouse name: Not on file  . Number of children: Not on file  . Years of education: Not on file  . Highest education level: Not on file  Occupational History  . Not on file  Tobacco Use  . Smoking status: Never Smoker  . Smokeless tobacco: Never Used  Substance and Sexual Activity  . Alcohol use: No  . Drug use: No  . Sexual activity: Not on file  Other Topics Concern  . Not on file  Social History Narrative  . Not on  file   Social Determinants of Health   Financial Resource Strain:   . Difficulty of Paying Living Expenses: Not on file  Food Insecurity:   . Worried About Charity fundraiser in the Last Year: Not on file  . Ran Out of Food in the Last Year: Not on file  Transportation Needs:   . Lack of Transportation (Medical): Not on file  . Lack of Transportation (Non-Medical): Not on file  Physical Activity:   . Days of Exercise per Week: Not on file  . Minutes of Exercise per Session: Not on file  Stress:   . Feeling of Stress : Not on file  Social Connections:   . Frequency of Communication with Friends and Family: Not on file  . Frequency of Social Gatherings with Friends and Family: Not on file  . Attends Religious Services: Not on file  . Active Member of Clubs or Organizations: Not on file  . Attends Archivist Meetings: Not on file  . Marital Status: Not on file  Intimate Partner Violence:   . Fear  of Current or Ex-Partner: Not on file  . Emotionally Abused: Not on file  . Physically Abused: Not on file  . Sexually Abused: Not on file     Constitutional: Denies fever, malaise, fatigue, headache or abrupt weight changes.  Respiratory: Denies difficulty breathing, shortness of breath, cough or sputum production.   Cardiovascular: Denies chest pain, chest tightness, palpitations or swelling in the hands or feet.  GU: Denies urgency, frequency, pain with urination, burning sensation, blood in urine, odor or discharge. Musculoskeletal: Pt reports bilateral hand and foot pain/swelling. Denies decrease in range of motion, difficulty with gait, muscle pain .  Skin: Pt reports knot on left lower leg, right upper back. Denies redness, rashes, or ulcercations.  Neurological: Pt reports hot flashes, night sweats. Denies dizziness, difficulty with memory, difficulty with speech or problems with balance and coordination.    No other specific complaints in a complete review of systems (except as listed in HPI above).     Objective:   Physical Exam  BP 124/80   Pulse 78   Temp (!) 97.4 F (36.3 C) (Temporal)   Wt 192 lb (87.1 kg)   SpO2 98%   BMI 34.01 kg/m   Wt Readings from Last 3 Encounters:  08/05/19 186 lb (84.4 kg)  03/11/19 188 lb 4 oz (85.4 kg)  01/02/19 172 lb (78 kg)    General: Appears her stated age, well developed, well nourished in NAD. Skin: Warm, dry and intact. No rashes noted. Small, fluctuant mass of left lateral lower leg, right upper back likely representing a cyst or lipoma. Cardiovascular: Normal rate and rhythm. S1,S2 noted.  No murmur, rubs or gallops noted. Radial pulses 2+ bilaterally. Pedal pulses 2+ bilaterally. Pulmonary/Chest: Normal effort and positive vesicular breath sounds. No respiratory distress. No wheezes, rales or ronchi noted.  Musculoskeletal: Normal flexion, extension and rotation of bilateral wrist and fingers. Normal flexion, extension and  rotation of bilateral ankles. No joint swelling noted. Strength 5/5 BUE/BLE. Hand grips equal. Neurological: Alert and oriented.  Coordination normal. Negative Phalen's. Negative Tinel's.   BMET    Component Value Date/Time   NA 138 12/25/2017 0025   K 3.4 (L) 12/25/2017 0025   CL 102 12/25/2017 0025   CO2 27 12/25/2017 0025   GLUCOSE 109 (H) 12/25/2017 0025   BUN 6 12/25/2017 0025   CREATININE 0.74 12/25/2017 0025   CALCIUM 9.2 12/25/2017 0025  GFRNONAA >60 12/25/2017 0025   GFRAA >60 12/25/2017 0025    Lipid Panel  No results found for: CHOL, TRIG, HDL, CHOLHDL, VLDL, LDLCALC  CBC    Component Value Date/Time   WBC 6.8 06/13/2018 0934   RBC 5.16 (H) 06/13/2018 0934   HGB 14.7 06/13/2018 0934   HCT 43.5 06/13/2018 0934   PLT 275.0 06/13/2018 0934   MCV 84.2 06/13/2018 0934   MCH 29.7 12/25/2017 0025   MCHC 33.9 06/13/2018 0934   RDW 14.0 06/13/2018 0934   LYMPHSABS 3.0 06/13/2018 0934   MONOABS 0.3 06/13/2018 0934   EOSABS 0.1 06/13/2018 0934   BASOSABS 0.0 06/13/2018 0934    Hgb A1C No results found for: HGBA1C         Assessment & Plan:  Bilateral Hand and Foot Pain:  Xray bilateral hands Xray bilateral feet Consider Meloxicam 7.5 mg PO daily vs referral to ortho pending xrays No evidence of swelling, has had 20 lbs weight gain, ? If this is a contributing factor  Knot of Leg, Upper Back:  Cyst vs lipoma No intervention needed at this time Will monitor  Will follow up after xrays, return precautions discussed Webb Silversmith, NP This visit occurred during the SARS-CoV-2 public health emergency.  Safety protocols were in place, including screening questions prior to the visit, additional usage of staff PPE, and extensive cleaning of exam room while observing appropriate contact time as indicated for disinfecting solutions.

## 2019-10-31 NOTE — Patient Instructions (Signed)
Joint Pain  Joint pain can be caused by many things. It is likely to go away if you follow instructions from your doctor for taking care of yourself at home. Sometimes, you may need more treatment. Follow these instructions at home: Managing pain, stiffness, and swelling   If told, put ice on the painful area. ? Put ice in a plastic bag. ? Place a towel between your skin and the bag. ? Leave the ice on for 20 minutes, 2-3 times a day.  If told, put heat on the painful area. Do this as often as told by your doctor. Use the heat source that your doctor recommends, such as a moist heat pack or a heating pad. ? Place a towel between your skin and the heat source. ? Leave the heat on for 20-30 minutes. ? Take off the heat if your skin gets bright red. This is especially important if you are unable to feel pain, heat, or cold. You may have a greater risk of getting burned.  Move your fingers or toes below the painful joint often. This helps with stiffness and swelling.  If possible, raise (elevate) the painful joint above the level of your heart while you are sitting or lying down. To do this, try putting a few pillows under the painful joint. Activity  Rest the painful joint for as long as told. Do not do things that cause pain or make your pain worse.  Begin exercising or stretching the affected area, as told by your doctor. Ask your doctor what types of exercise are safe for you. If you have an elastic bandage, sling, or splint:  Wear the device as told by your doctor. Take it off only as told by your doctor.  Loosen the device if your fingers or toes below the joint: ? Tingle. ? Lose feeling (get numb). ? Get cold and blue.  Keep the device clean.  Ask your doctor if you should take off the device before bathing. You may need to cover it with a watertight covering when you take a bath or a shower. General instructions  Take over-the-counter and prescription medicines only as told  by your doctor.  Do not use any products that contain nicotine or tobacco. These include cigarettes and e-cigarettes. If you need help quitting, ask your doctor.  Keep all follow-up visits as told by your doctor. This is important. Contact a doctor if:  You have pain that gets worse and does not get better with medicine.  Your joint pain does not get better in 3 days.  You have more bruising or swelling.  You have a fever.  You lose 10 lb (4.5 kg) or more without trying. Get help right away if:  You cannot move the joint.  Your fingers or toes tingle, lose feeling, or get cold and blue.  You have a fever along with a joint that is red, warm, and swollen. Summary  Joint pain can be caused by many things. It often goes away if you follow instructions from your doctor for taking care of yourself at home.  Rest the painful joint for as long as told. Do not do things that cause pain or make your pain worse.  Take over-the-counter and prescription medicines only as told by your doctor. This information is not intended to replace advice given to you by your health care provider. Make sure you discuss any questions you have with your health care provider. Document Revised: 07/28/2017 Document Reviewed: 05/31/2017 Elsevier  Patient Education  [employer].

## 2019-11-01 ENCOUNTER — Other Ambulatory Visit: Payer: Self-pay | Admitting: Internal Medicine

## 2019-11-01 DIAGNOSIS — M25531 Pain in right wrist: Secondary | ICD-10-CM

## 2019-11-01 DIAGNOSIS — Z832 Family history of diseases of the blood and blood-forming organs and certain disorders involving the immune mechanism: Secondary | ICD-10-CM

## 2019-11-04 ENCOUNTER — Ambulatory Visit: Payer: No Typology Code available for payment source | Admitting: Internal Medicine

## 2019-11-07 ENCOUNTER — Other Ambulatory Visit (INDEPENDENT_AMBULATORY_CARE_PROVIDER_SITE_OTHER): Payer: No Typology Code available for payment source

## 2019-11-07 ENCOUNTER — Other Ambulatory Visit: Payer: Self-pay

## 2019-11-07 DIAGNOSIS — M25531 Pain in right wrist: Secondary | ICD-10-CM

## 2019-11-07 DIAGNOSIS — Z832 Family history of diseases of the blood and blood-forming organs and certain disorders involving the immune mechanism: Secondary | ICD-10-CM

## 2019-11-07 DIAGNOSIS — M25532 Pain in left wrist: Secondary | ICD-10-CM

## 2019-11-07 LAB — SEDIMENTATION RATE: Sed Rate: 8 mm/hr (ref 0–20)

## 2019-11-07 LAB — HIGH SENSITIVITY CRP: CRP, High Sensitivity: 2.55 mg/L (ref 0.000–5.000)

## 2019-11-12 LAB — ANTIPHOSPHOLIPID SYNDROME DIAGNOSTIC PANEL
Anticardiolipin IgA: <11 [APL'U] (ref <=11)
Anticardiolipin IgG: <14 [GPL'U] (ref <=14)
Anticardiolipin IgM: <12 [MPL'U] (ref <=12)
Beta-2 Glyco 1 IgA: <9 SAU (ref <=20)
Beta-2 Glyco 1 IgM: <9 SMU (ref <=20)
Beta-2 Glyco I IgG: <9 SGU (ref <=20)
PTT-LA Screen: 34 s (ref <=40)
dRVVT: 36 s (ref <=45)

## 2019-11-12 LAB — C3 AND C4
C3 Complement: 150 mg/dL (ref 83–193)
C4 Complement: 33 mg/dL (ref 15–57)

## 2019-11-12 LAB — ANTI-DNA ANTIBODY, DOUBLE-STRANDED: ds DNA Ab: <1 IU/mL

## 2019-11-12 LAB — RHEUMATOID FACTOR: Rheumatoid fact SerPl-aCnc: <14 IU/mL (ref <14)

## 2019-11-12 LAB — ANA: Anti Nuclear Antibody (ANA): NEGATIVE

## 2019-11-18 ENCOUNTER — Encounter: Payer: Self-pay | Admitting: Internal Medicine

## 2019-11-19 ENCOUNTER — Encounter: Payer: Self-pay | Admitting: Internal Medicine

## 2019-11-19 ENCOUNTER — Other Ambulatory Visit: Payer: Self-pay

## 2019-11-19 ENCOUNTER — Ambulatory Visit (INDEPENDENT_AMBULATORY_CARE_PROVIDER_SITE_OTHER): Payer: No Typology Code available for payment source | Admitting: Internal Medicine

## 2019-11-19 VITALS — BP 118/78 | HR 78 | Temp 97.7°F | Wt 189.0 lb

## 2019-11-19 DIAGNOSIS — L255 Unspecified contact dermatitis due to plants, except food: Secondary | ICD-10-CM

## 2019-11-19 MED ORDER — METHYLPREDNISOLONE ACETATE 80 MG/ML IJ SUSP
80.0000 mg | Freq: Once | INTRAMUSCULAR | Status: AC
  Administered 2019-11-19: 80 mg via INTRAMUSCULAR

## 2019-11-19 NOTE — Addendum Note (Signed)
Addended by: Lurlean Nanny on: 11/19/2019 10:34 AM   Modules accepted: Orders

## 2019-11-19 NOTE — Progress Notes (Signed)
Subjective:    Patient ID: Vanessa Byrd, female    DOB: May 17, 1976, 44 y.o.   MRN: [id number]  HPI  Pt presents to the clinic today with c/o a rash. She noticed this 9 days ago. The rash first started on her left arm and has spread to her right arm, chest and abdomen. The rash itches but does not burn. She denies changes in soaps, lotions, detergents, medications or diet. No one in her home has a similar rash. She denies any new pets in the home. She was working out in the yard prior to the rash starting. She has tried Benadryl and Calamine lotion OTC with minimal relief.  Review of Systems      Past Medical History:  Diagnosis Date  . Colitis   . Esophagitis   . H. pylori infection   . Sinusitis     Current Outpatient Medications  Medication Sig Dispense Refill  . estradiol (ESTRACE) 0.5 MG tablet estradiol 0.5 mg tablet  Take 1 tablet every day by oral route.    . Ferrous Gluconate (IRON 27 PO) iron    . FLUoxetine (PROZAC) 10 MG tablet Take 1 tablet (10 mg total) by mouth daily. 30 tablet 0  . fluticasone (FLONASE) 50 MCG/ACT nasal spray Place 2 sprays into both nostrils daily. 16 g 0  . medroxyPROGESTERone (PROVERA) 2.5 MG tablet     . Multiple Vitamin (MULTIVITAMIN) tablet Take 1 tablet by mouth daily.    . naproxen (NAPROSYN) 500 MG tablet Take 1 tablet (500 mg total) by mouth 2 (two) times daily. 30 tablet 0  . omeprazole (PRILOSEC) 40 MG capsule Take 1 capsule (40 mg total) by mouth 2 (two) times daily. 60 capsule 3   No current facility-administered medications for this visit.    Allergies  Allergen Reactions  . Amoxicillin     REACTION: swelling. angioedema    Family History  Problem Relation Age of Onset  . GER disease Mother   . Colon cancer Neg Hx   . Esophageal cancer Neg Hx   . Inflammatory bowel disease Neg Hx   . Liver disease Neg Hx   . Pancreatic cancer Neg Hx   . Rectal cancer Neg Hx   . Stomach cancer Neg Hx     Social History    Socioeconomic History  . Marital status: Married    Spouse name: Not on file  . Number of children: Not on file  . Years of education: Not on file  . Highest education level: Not on file  Occupational History  . Not on file  Tobacco Use  . Smoking status: Never Smoker  . Smokeless tobacco: Never Used  Substance and Sexual Activity  . Alcohol use: No  . Drug use: No  . Sexual activity: Not on file  Other Topics Concern  . Not on file  Social History Narrative  . Not on file   Social Determinants of Health   Financial Resource Strain:   . Difficulty of Paying Living Expenses:   Food Insecurity:   . Worried About Charity fundraiser in the Last Year:   . Arboriculturist in the Last Year:   Transportation Needs:   . Film/video editor (Medical):   Marland Kitchen Lack of Transportation (Non-Medical):   Physical Activity:   . Days of Exercise per Week:   . Minutes of Exercise per Session:   Stress:   . Feeling of Stress :   Social Connections:   .  Frequency of Communication with Friends and Family:   . Frequency of Social Gatherings with Friends and Family:   . Attends Religious Services:   . Active Member of Clubs or Organizations:   . Attends Archivist Meetings:   Marland Kitchen Marital Status:   Intimate Partner Violence:   . Fear of Current or Ex-Partner:   . Emotionally Abused:   Marland Kitchen Physically Abused:   . Sexually Abused:      Constitutional: Denies fever, malaise, fatigue, headache or abrupt weight changes.  Respiratory: Denies difficulty breathing, shortness of breath, cough or sputum production.   Cardiovascular: Denies chest pain, chest tightness, palpitations or swelling in the hands or feet.   Skin: Pt reports red rash on upper body. Denies ulcercations.    No other specific complaints in a complete review of systems (except as listed in HPI above).  Objective:   Physical Exam  BP 118/78   Pulse 78   Temp 97.7 F (36.5 C) (Temporal)   Wt 189 lb (85.7 kg)    SpO2 98%   BMI 33.48 kg/m   Wt Readings from Last 3 Encounters:  10/31/19 192 lb (87.1 kg)  08/05/19 186 lb (84.4 kg)  03/11/19 188 lb 4 oz (85.4 kg)    General: Appears her stated age, well developed, well nourished in NAD. Skin: Grouped crusted lesion on erythematous base noted over bilateral antecubital fossa with scattered lesions noted on the abdomen. Cardiovascular: Normal rate and rhythm.  Pulmonary/Chest: Normal effort and positive vesicular breath sounds. No respiratory distress. No wheezes, rales or ronchi noted.  Neurological: Alert and oriented.    BMET    Component Value Date/Time   NA 138 12/25/2017 0025   K 3.4 (L) 12/25/2017 0025   CL 102 12/25/2017 0025   CO2 27 12/25/2017 0025   GLUCOSE 109 (H) 12/25/2017 0025   BUN 6 12/25/2017 0025   CREATININE 0.74 12/25/2017 0025   CALCIUM 9.2 12/25/2017 0025   GFRNONAA >60 12/25/2017 0025   GFRAA >60 12/25/2017 0025    Lipid Panel  No results found for: CHOL, TRIG, HDL, CHOLHDL, VLDL, LDLCALC  CBC    Component Value Date/Time   WBC 6.8 06/13/2018 0934   RBC 5.16 (H) 06/13/2018 0934   HGB 14.7 06/13/2018 0934   HCT 43.5 06/13/2018 0934   PLT 275.0 06/13/2018 0934   MCV 84.2 06/13/2018 0934   MCH 29.7 12/25/2017 0025   MCHC 33.9 06/13/2018 0934   RDW 14.0 06/13/2018 0934   LYMPHSABS 3.0 06/13/2018 0934   MONOABS 0.3 06/13/2018 0934   EOSABS 0.1 06/13/2018 0934   BASOSABS 0.0 06/13/2018 0934    Hgb A1C No results found for: HGBA1C          Assessment & Plan:   Contact Dermatitis due to Plant:  80 mg Depo IM today RX for Triamcinolone 0.1% BID prn Can continue oral Benadryl  Return precautions discussed  Webb Silversmith, NP This visit occurred during the SARS-CoV-2 public health emergency.  Safety protocols were in place, including screening questions prior to the visit, additional usage of staff PPE, and extensive cleaning of exam room while observing appropriate contact time as indicated for  disinfecting solutions.

## 2019-11-19 NOTE — Patient Instructions (Signed)
Contact Dermatitis °Dermatitis is redness, soreness, and swelling (inflammation) of the skin. Contact dermatitis is a reaction to something that touches the skin. °There are two types of contact dermatitis: °· Irritant contact dermatitis. This happens when something bothers (irritates) your skin, like soap. °· Allergic contact dermatitis. This is caused when you are exposed to something that you are allergic to, such as poison ivy. °What are the causes? °· Common causes of irritant contact dermatitis include: °? Makeup. °? Soaps. °? Detergents. °? Bleaches. °? Acids. °? Metals, such as nickel. °· Common causes of allergic contact dermatitis include: °? Plants. °? Chemicals. °? Jewelry. °? Latex. °? Medicines. °? Preservatives in products, such as clothing. °What increases the risk? °· Having a job that exposes you to things that bother your skin. °· Having asthma or eczema. °What are the signs or symptoms? °Symptoms may happen anywhere the irritant has touched your skin. Symptoms include: °· Dry or flaky skin. °· Redness. °· Cracks. °· Itching. °· Pain or a burning feeling. °· Blisters. °· Blood or clear fluid draining from skin cracks. °With allergic contact dermatitis, swelling may occur. This may happen in places such as the eyelids, mouth, or genitals. °How is this treated? °· This condition is treated by checking for the cause of the reaction and protecting your skin. Treatment may also include: °? Steroid creams, ointments, or medicines. °? Antibiotic medicines or other ointments, if you have a skin infection. °? Lotion or medicines to help with itching. °? A bandage (dressing). °Follow these instructions at home: °Skin care °· Moisturize your skin as needed. °· Put cool cloths on your skin. °· Put a baking soda paste on your skin. Stir water into baking soda until it looks like a paste. °· Do not scratch your skin. °· Avoid having things rub up against your skin. °· Avoid the use of soaps, perfumes, and  dyes. °Medicines °· Take or apply over-the-counter and prescription medicines only as told by your doctor. °· If you were prescribed an antibiotic medicine, take or apply it as told by your doctor. Do not stop using it even if your condition starts to get better. °Bathing °· Take a bath with: °? Epsom salts. °? Baking soda. °? Colloidal oatmeal. °· Bathe less often. °· Bathe in warm water. Avoid using hot water. °Bandage care °· If you were given a bandage, change it as told by your health care provider. °· Wash your hands with soap and water before and after you change your bandage. If soap and water are not available, use hand sanitizer. °General instructions °· Avoid the things that caused your reaction. If you do not know what caused it, keep a journal. Write down: °? What you eat. °? What skin products you use. °? What you drink. °? What you wear in the area that has symptoms. This includes jewelry. °· Check the affected areas every day for signs of infection. Check for: °? More redness, swelling, or pain. °? More fluid or blood. °? Warmth. °? Pus or a bad smell. °· Keep all follow-up visits as told by your doctor. This is important. °Contact a doctor if: °· You do not get better with treatment. °· Your condition gets worse. °· You have signs of infection, such as: °? More swelling. °? Tenderness. °? More redness. °? Soreness. °? Warmth. °· You have a fever. °· You have new symptoms. °Get help right away if: °· You have a very bad headache. °· You have neck pain. °·   Your neck is stiff. °· You throw up (vomit). °· You feel very sleepy. °· You see red streaks coming from the area. °· Your bone or joint near the area hurts after the skin has healed. °· The area turns darker. °· You have trouble breathing. °Summary °· Dermatitis is redness, soreness, and swelling of the skin. °· Symptoms may occur where the irritant has touched you. °· Treatment may include medicines and skin care. °· If you do not know what caused  your reaction, keep a journal. °· Contact a doctor if your condition gets worse or you have signs of infection. °This information is not intended to replace advice given to you by your health care provider. Make sure you discuss any questions you have with your health care provider. °Document Revised: 12/05/2018 Document Reviewed: 02/28/2018 °Elsevier Patient Education © 2020 [employer] ° °

## 2019-11-20 ENCOUNTER — Encounter: Payer: Self-pay | Admitting: Internal Medicine

## 2019-11-20 MED ORDER — TRIAMCINOLONE ACETONIDE 0.1 % EX OINT: 1.0000 "application " | TOPICAL_OINTMENT | Freq: Two times a day (BID) | CUTANEOUS | 0 refills | Status: DC

## 2019-11-20 MED FILL — TRIAMCINOLONE 0.1% OINTMEN: 0.1 | 30 days supply | Qty: 30 | Fill #0

## 2019-11-22 ENCOUNTER — Encounter: Payer: Self-pay | Admitting: Internal Medicine

## 2020-01-06 ENCOUNTER — Encounter: Payer: Self-pay | Admitting: Internal Medicine

## 2020-01-06 ENCOUNTER — Telehealth (INDEPENDENT_AMBULATORY_CARE_PROVIDER_SITE_OTHER): Payer: No Typology Code available for payment source | Admitting: Internal Medicine

## 2020-01-06 DIAGNOSIS — R05 Cough: Secondary | ICD-10-CM

## 2020-01-06 DIAGNOSIS — R0981 Nasal congestion: Secondary | ICD-10-CM | POA: Diagnosis not present

## 2020-01-06 DIAGNOSIS — R059 Cough, unspecified: Secondary | ICD-10-CM

## 2020-01-06 DIAGNOSIS — J029 Acute pharyngitis, unspecified: Secondary | ICD-10-CM

## 2020-01-06 MED ORDER — PREDNISONE 10 MG PO TABS: ORAL_TABLET | ORAL | 0 refills | Status: DC

## 2020-01-06 NOTE — Patient Instructions (Signed)

## 2020-01-06 NOTE — Progress Notes (Signed)
Virtual Visit via Video Note  I connected with Vanessa Byrd on 01/06/20 at  2:45 PM EDT by a video enabled telemedicine application and verified that I am speaking with the correct person using two identifiers.  Location: Patient: Home Provider: Office   I discussed the limitations of evaluation and management by telemedicine and the availability of in person appointments. The patient expressed understanding and agreed to proceed.  History of Present Illness:  Pt reports fatigue, nasal congestion, sore throat, cough, chills and body aches. This started 2-3 days ago. She is having difficulty swallowing. The cough is mostly nonproductive. She denies headache, runny nose, ear pain, loss of taste/smell, or SOB. She denies fevers. She has tried Claritin, Flonase OTC with minimal relief. She has not had exposure to Covid that she is aware of. She has had a negative rapid Covid test.   Past Medical History:  Diagnosis Date  . Colitis   . Esophagitis   . H. pylori infection   . Sinusitis     Current Outpatient Medications  Medication Sig Dispense Refill  . estradiol (ESTRACE) 0.5 MG tablet estradiol 0.5 mg tablet  Take 1 tablet every day by oral route.    . Ferrous Gluconate (IRON 27 PO) iron    . FLUoxetine (PROZAC) 10 MG tablet Take 1 tablet (10 mg total) by mouth daily. 30 tablet 0  . fluticasone (FLONASE) 50 MCG/ACT nasal spray Place 2 sprays into both nostrils daily. 16 g 0  . medroxyPROGESTERone (PROVERA) 2.5 MG tablet     . Multiple Vitamin (MULTIVITAMIN) tablet Take 1 tablet by mouth daily.    . naproxen (NAPROSYN) 500 MG tablet Take 1 tablet (500 mg total) by mouth 2 (two) times daily. 30 tablet 0  . omeprazole (PRILOSEC) 40 MG capsule Take 1 capsule (40 mg total) by mouth 2 (two) times daily. 60 capsule 3  . triamcinolone ointment (KENALOG) 0.1 % Apply 1 application topically 2 (two) times daily. 30 g 0   No current facility-administered medications for this visit.     Allergies  Allergen Reactions  . Amoxicillin     REACTION: swelling. angioedema    Family History  Problem Relation Age of Onset  . GER disease Mother   . Colon cancer Neg Hx   . Esophageal cancer Neg Hx   . Inflammatory bowel disease Neg Hx   . Liver disease Neg Hx   . Pancreatic cancer Neg Hx   . Rectal cancer Neg Hx   . Stomach cancer Neg Hx     Social History   Socioeconomic History  . Marital status: Married    Spouse name: Not on file  . Number of children: Not on file  . Years of education: Not on file  . Highest education level: Not on file  Occupational History  . Not on file  Tobacco Use  . Smoking status: Never Smoker  . Smokeless tobacco: Never Used  Substance and Sexual Activity  . Alcohol use: No  . Drug use: No  . Sexual activity: Not on file  Other Topics Concern  . Not on file  Social History Narrative  . Not on file   Social Determinants of Health   Financial Resource Strain:   . Difficulty of Paying Living Expenses:   Food Insecurity:   . Worried About Charity fundraiser in the Last Year:   . Arboriculturist in the Last Year:   Transportation Needs:   . Lack of Transportation (  Medical):   Marland Kitchen Lack of Transportation (Non-Medical):   Physical Activity:   . Days of Exercise per Week:   . Minutes of Exercise per Session:   Stress:   . Feeling of Stress :   Social Connections:   . Frequency of Communication with Friends and Family:   . Frequency of Social Gatherings with Friends and Family:   . Attends Religious Services:   . Active Member of Clubs or Organizations:   . Attends Archivist Meetings:   Marland Kitchen Marital Status:   Intimate Partner Violence:   . Fear of Current or Ex-Partner:   . Emotionally Abused:   Marland Kitchen Physically Abused:   . Sexually Abused:      Constitutional: Denies fever, malaise, fatigue, headache or abrupt weight changes.  HEENT: Pt reports nasal congestion, sore throat. Denies eye pain, eye redness, ear  pain, ringing in the ears, wax buildup, runny nose, bloody nose. Respiratory: Pt reports cough. Denies difficulty breathing, shortness of breath, or sputum production.   Cardiovascular: Denies chest pain, chest tightness, palpitations or swelling in the hands or feet.  G No other specific complaints in a complete review of systems (except as listed in HPI above).  Observations/Objective:   Wt Readings from Last 3 Encounters:  11/19/19 189 lb (85.7 kg)  10/31/19 192 lb (87.1 kg)  08/05/19 186 lb (84.4 kg)    General: Appears her stated age, obese, in NAD. HEENT: Head: normal shape and size;  Nose: congestion noted; Throat/Mouth: hoarseness noted. Pulmonary/Chest: Normal effort. Dry cough noted. No respiratory distress.  Neurological: Alert and oriented.    BMET    Component Value Date/Time   NA 138 12/25/2017 0025   K 3.4 (L) 12/25/2017 0025   CL 102 12/25/2017 0025   CO2 27 12/25/2017 0025   GLUCOSE 109 (H) 12/25/2017 0025   BUN 6 12/25/2017 0025   CREATININE 0.74 12/25/2017 0025   CALCIUM 9.2 12/25/2017 0025   GFRNONAA >60 12/25/2017 0025   GFRAA >60 12/25/2017 0025    Lipid Panel  No results found for: CHOL, TRIG, HDL, CHOLHDL, VLDL, LDLCALC  CBC    Component Value Date/Time   WBC 6.8 06/13/2018 0934   RBC 5.16 (H) 06/13/2018 0934   HGB 14.7 06/13/2018 0934   HCT 43.5 06/13/2018 0934   PLT 275.0 06/13/2018 0934   MCV 84.2 06/13/2018 0934   MCH 29.7 12/25/2017 0025   MCHC 33.9 06/13/2018 0934   RDW 14.0 06/13/2018 0934   LYMPHSABS 3.0 06/13/2018 0934   MONOABS 0.3 06/13/2018 0934   EOSABS 0.1 06/13/2018 0934   BASOSABS 0.0 06/13/2018 0934    Hgb A1C No results found for: HGBA1C     Assessment and Plan:  Sore Throat, Nasal Congestion, Cough:  DDx include allergies, viral URI, Covid, viral sinusitis Continue Claritin and Flonase Delsym OTC for cough RX for Pred Taper x6 days Would repeat Covid PCR in 2-3 days if no improvement in symptoms.  Work  note provided, return precautions discussed  Follow Up Instructions:    I discussed the assessment and treatment plan with the patient. The patient was provided an opportunity to ask questions and all were answered. The patient agreed with the plan and demonstrated an understanding of the instructions.   The patient was advised to call back or seek an in-person evaluation if the symptoms worsen or if the condition fails to improve as anticipated.   Webb Silversmith, NP

## 2020-01-07 ENCOUNTER — Encounter: Payer: Self-pay | Admitting: Internal Medicine

## 2020-01-08 ENCOUNTER — Encounter: Payer: Self-pay | Admitting: Internal Medicine

## 2020-01-08 ENCOUNTER — Other Ambulatory Visit: Payer: Self-pay

## 2020-01-08 ENCOUNTER — Encounter (HOSPITAL_COMMUNITY): Payer: Self-pay

## 2020-01-08 ENCOUNTER — Ambulatory Visit (HOSPITAL_COMMUNITY)
Admission: EM | Admit: 2020-01-08 | Discharge: 2020-01-08 | Disposition: A | Discharge status: Home / Self Care | Payer: No Typology Code available for payment source | Attending: Emergency Medicine | Admitting: Emergency Medicine

## 2020-01-08 DIAGNOSIS — R059 Cough, unspecified: Secondary | ICD-10-CM

## 2020-01-08 DIAGNOSIS — K120 Recurrent oral aphthae: Secondary | ICD-10-CM | POA: Diagnosis not present

## 2020-01-08 DIAGNOSIS — R05 Cough: Secondary | ICD-10-CM

## 2020-01-08 MED ORDER — TRIAMCINOLONE ACETONIDE 0.1 % MT PSTE: 1.0000 "application " | PASTE | Freq: Two times a day (BID) | OROMUCOSAL | 12 refills | Status: DC

## 2020-01-08 MED ORDER — BENZONATATE 100 MG PO CAPS: 100.0000 mg | ORAL_CAPSULE | Freq: Three times a day (TID) | ORAL | 0 refills | Status: DC

## 2020-01-08 NOTE — ED Provider Notes (Addendum)
[place]    CSN: [id number] Arrival date & time: 01/08/20  1305      History   Chief Complaint Chief Complaint  Patient presents with  . Cough    HPI Vanessa Byrd is a 44 y.o. female.   Who presented to the urgent care with a complaint of cough, and mouth sore for the past few days.  Reported she has been exposed to hand-foot-and-mouth disease.  One of her friends that she was following with children tested positive for HFMD.  Reports she contacted her PCP who has prescribed her prednisone.  Has tested negative for COVID-19.  Has not tried any medication.   Denies similar symptoms in the past.  Denies fever, chills, fatigue, nasal congestion, rhinorrhea, SOB, wheezing, chest pain, nausea, vomiting, changes in bowel or bladder habits.       Past Medical History:  Diagnosis Date  . Colitis   . Esophagitis   . H. pylori infection   . Sinusitis     Patient Active Problem List   Diagnosis Date Noted  . Iron deficiency 07/19/2018  . Gastroesophageal reflux disease with esophagitis 06/13/2018  . Anxiety and depression 05/31/2013  . ALLERGIC RHINITIS 11/24/2009    Past Surgical History:  Procedure Laterality Date  . TONSILLECTOMY    . WISDOM TOOTH EXTRACTION      OB History   No obstetric history on file.      Home Medications    Prior to Admission medications   Medication Sig Start Date End Date Taking? Authorizing Provider  benzonatate (TESSALON) 100 MG capsule Take 1 capsule (100 mg total) by mouth every 8 (eight) hours. 01/08/20   Emmitt Matthews, Darrelyn Hillock, FNP  estradiol (ESTRACE) 0.5 MG tablet estradiol 0.5 mg tablet  Take 1 tablet every day by oral route.    [provider]  Ferrous Gluconate (IRON 27 PO) iron    [provider]  FLUoxetine (PROZAC) 10 MG tablet Take 1 tablet (10 mg total) by mouth daily. 06/26/19   Jearld Fenton, NP  fluticasone (FLONASE) 50 MCG/ACT nasal spray Place 2 sprays into both nostrils daily.  09/17/17   Ok Edwards, PA-C  medroxyPROGESTERone (PROVERA) 2.5 MG tablet  12/28/18   [provider]  Multiple Vitamin (MULTIVITAMIN) tablet Take 1 tablet by mouth daily.    [provider]  naproxen (NAPROSYN) 500 MG tablet Take 1 tablet (500 mg total) by mouth 2 (two) times daily. 11/12/18   Zigmund Gottron, NP  omeprazole (PRILOSEC) 40 MG capsule Take 1 capsule (40 mg total) by mouth 2 (two) times daily. 06/13/18   Mansouraty, Telford Nab., MD  predniSONE (DELTASONE) 10 MG tablet Take 6 tabs day 1, 5 tabs day 2, 4 tabs day 3, 3 tabs day 4, 2 tabs day 5, 1 tab day 6 01/06/20   Baity, Coralie Keens, NP  triamcinolone (KENALOG) 0.1 % paste Use as directed 1 application in the mouth or throat 2 (two) times daily. 01/08/20   Riann Oman, Darrelyn Hillock, FNP  triamcinolone ointment (KENALOG) 0.1 % Apply 1 application topically 2 (two) times daily. 11/20/19   Jearld Fenton, NP    Family History Family History  Problem Relation Age of Onset  . GER disease Mother   . Colon cancer Neg Hx   . Esophageal cancer Neg Hx   . Inflammatory bowel disease Neg Hx   . Liver disease Neg Hx   . Pancreatic cancer Neg Hx   . Rectal cancer Neg  Hx   . Stomach cancer Neg Hx     Social History Social History   Tobacco Use  . Smoking status: Never Smoker  . Smokeless tobacco: Never Used  Substance Use Topics  . Alcohol use: No  . Drug use: No     Allergies   Amoxicillin   Review of Systems Review of Systems  Constitutional: Negative.   HENT: Positive for mouth sores.   Respiratory: Positive for cough.   Cardiovascular: Negative.   All other systems reviewed and are negative.    Physical Exam Triage Vital Signs ED Triage Vitals [01/08/20 1402]  Enc Vitals Group     BP      Pulse      Resp      Temp      Temp src      SpO2      Weight 187 lb 12.8 oz (85.2 kg)     Height      Head Circumference      Peak Flow      Pain Score 6     Pain Loc      Pain Edu?      Excl. in [place]?    No  data found.  Updated Vital Signs Wt 187 lb 12.8 oz (85.2 kg)   BMI 33.27 kg/m   Visual Acuity Right Eye Distance:   Left Eye Distance:   Bilateral Distance:    Right Eye Near:   Left Eye Near:    Bilateral Near:     Physical Exam Vitals and nursing note reviewed.  Constitutional:      General: She is not in acute distress.    Appearance: Normal appearance. She is normal weight. She is not ill-appearing, toxic-appearing or diaphoretic.  HENT:     Head: Normocephalic.     Comments: aphthous ulcer present in mouth    Right Ear: Tympanic membrane, ear canal and external ear normal. There is no impacted cerumen.     Left Ear: Tympanic membrane, ear canal and external ear normal. There is no impacted cerumen.     Nose: Nose normal. No congestion.     Mouth/Throat:     Mouth: Mucous membranes are moist.     Pharynx: No oropharyngeal exudate or posterior oropharyngeal erythema.  Cardiovascular:     Rate and Rhythm: Normal rate and regular rhythm.     Pulses: Normal pulses.     Heart sounds: Normal heart sounds. No murmur. No friction rub. No gallop.   Pulmonary:     Effort: Pulmonary effort is normal. No respiratory distress.     Breath sounds: Normal breath sounds. No stridor. No wheezing, rhonchi or rales.  Chest:     Chest wall: No tenderness.  Neurological:     Mental Status: She is alert.      UC Treatments / Results  Labs (all labs ordered are listed, but only abnormal results are displayed) Labs Reviewed - No data to display  EKG   Radiology No results found.  Procedures Procedures (including critical care time)  Medications Ordered in UC Medications - No data to display  Initial Impression / Assessment and Plan / UC Course  I have reviewed the triage vital signs and the nursing notes.  Pertinent labs & imaging results that were available during my care of the patient were reviewed by me and considered in my medical decision making (see chart for  details).     Patient is stable at discharge.  His symptom  is likely similar to aphthous ulcer of mouth.  We will treat with Kenalog paste.  Tessalon was prescribed for cough.  Was advised to follow PCP. Final Clinical Impressions(s) / UC Diagnoses   Final diagnoses:  Aphthous ulcer of mouth  Cough     Discharge Instructions     Take medication as prescribed Kenalog paste prescribed for aphthous ulcer Tessalon for cough Follow-up with PCP Return for worsening of symptoms    ED Prescriptions    Medication Sig Dispense Auth. Provider   triamcinolone (KENALOG) 0.1 % paste Use as directed 1 application in the mouth or throat 2 (two) times daily. 5 g Shalaine Payson, Darrelyn Hillock, FNP   benzonatate (TESSALON) 100 MG capsule  (Status: Discontinued) Take 1 capsule (100 mg total) by mouth every 8 (eight) hours. 21 capsule Maicie Vanderloop S, FNP   benzonatate (TESSALON) 100 MG capsule Take 1 capsule (100 mg total) by mouth every 8 (eight) hours. 21 capsule Abshir Paolini, Darrelyn Hillock, FNP     PDMP not reviewed this encounter.   Emerson Monte, FNP 01/08/20 1512    Emerson Monte, FNP 01/08/20 1515

## 2020-01-08 NOTE — ED Triage Notes (Signed)
Pt states she has been exposed to hand foot and mouth. Pt states she was around it over the weekend. Pt states she has blister coming up in her mouth and on her hands.pt states a cough as well. Pt states she's taking prednisone.  Pt states he has has a Covid test already and it was negative.

## 2020-01-08 NOTE — Telephone Encounter (Signed)
Her and I have been communicating through [employer]. At this point, she would need to be seen in person but with those symptoms, would recommend UC if she feels like she needs to be seen.

## 2020-01-08 NOTE — Discharge Instructions (Signed)
Take medication as prescribed Kenalog paste prescribed for aphthous ulcer Tessalon for cough Follow-up with PCP Return for worsening of symptoms

## 2020-01-08 NOTE — Telephone Encounter (Signed)
Patient called to schedule appointment Scheduled for tomorrow, 9:15am  Patient stated she does have cough and congestion that you are aware of.  No other symptoms  Still okay to bring in office?

## 2020-01-08 NOTE — Telephone Encounter (Signed)
Does patient need another visit? If so, virtual or in person?

## 2020-01-09 ENCOUNTER — Ambulatory Visit: Payer: No Typology Code available for payment source | Admitting: Internal Medicine

## 2020-01-12 ENCOUNTER — Encounter: Payer: Self-pay | Admitting: Internal Medicine

## 2020-01-15 ENCOUNTER — Encounter: Payer: Self-pay | Admitting: Internal Medicine

## 2020-01-15 ENCOUNTER — Telehealth (INDEPENDENT_AMBULATORY_CARE_PROVIDER_SITE_OTHER): Payer: No Typology Code available for payment source | Admitting: Internal Medicine

## 2020-01-15 ENCOUNTER — Other Ambulatory Visit: Payer: Self-pay | Admitting: Internal Medicine

## 2020-01-15 DIAGNOSIS — J0140 Acute pansinusitis, unspecified: Secondary | ICD-10-CM

## 2020-01-15 DIAGNOSIS — F32A Depression, unspecified: Secondary | ICD-10-CM

## 2020-01-15 MED ORDER — AZITHROMYCIN 250 MG PO TABS: ORAL_TABLET | ORAL | 0 refills | Status: DC

## 2020-01-15 MED FILL — AZITHROMYCIN 250 MG TABLET: 250 | 5 days supply | Qty: 6 | Fill #0

## 2020-01-15 NOTE — Patient Instructions (Signed)

## 2020-01-15 NOTE — Progress Notes (Signed)
Virtual Visit via Video Note  I connected with Vanessa Byrd on 01/15/20 at  3:00 PM EDT by a video enabled telemedicine application and verified that I am speaking with the correct person using two identifiers.  Location: Patient: Work Provider: Office   I discussed the limitations of evaluation and management by telemedicine and the availability of in person appointments. The patient expressed understanding and agreed to proceed.  History of Present Illness:  Pt wanting to follow up sinus symptoms. She was seen 5/10 for the same, dx with viral sinusitis, treated with a Pred taper. She was seen at [facility] 5/12 for oral lesion and cough, dx with apthous ulcer, treated with Triamcinolone Paste and Tessalon. Today, she reports headache, left sided facial pressure and cough. The headache is located in her forehead. She describes the pain as pressure. She denies dizziness or visual changes She reports facial pain in her left cheek. She is blowing green mucous from her nose. The cough is productive of green mucous. She denies fever, chills or body aches. She has tried Copywriter, advertising, Flonase and Tylenol sinus with minimal relief. She has not gotten a Covid test and denies exposure.  She needs refill of her Fluoxetine today. She reports this manages her anxiety and depression. She denies SI/HI. She is not currently seeing a therapist.  Past Medical History:  Diagnosis Date  . Colitis   . Esophagitis   . H. pylori infection   . Sinusitis     Current Outpatient Medications  Medication Sig Dispense Refill  . benzonatate (TESSALON) 100 MG capsule Take 1 capsule (100 mg total) by mouth every 8 (eight) hours. 21 capsule 0  . estradiol (ESTRACE) 0.5 MG tablet estradiol 0.5 mg tablet  Take 1 tablet every day by oral route.    . Ferrous Gluconate (IRON 27 PO) iron    . FLUoxetine (PROZAC) 10 MG tablet Take 1 tablet (10 mg total) by mouth daily. 30 tablet 0  . fluticasone (FLONASE) 50 MCG/ACT nasal spray  Place 2 sprays into both nostrils daily. 16 g 0  . medroxyPROGESTERone (PROVERA) 2.5 MG tablet     . Multiple Vitamin (MULTIVITAMIN) tablet Take 1 tablet by mouth daily.    . naproxen (NAPROSYN) 500 MG tablet Take 1 tablet (500 mg total) by mouth 2 (two) times daily. 30 tablet 0  . omeprazole (PRILOSEC) 40 MG capsule Take 1 capsule (40 mg total) by mouth 2 (two) times daily. 60 capsule 3  . predniSONE (DELTASONE) 10 MG tablet Take 6 tabs day 1, 5 tabs day 2, 4 tabs day 3, 3 tabs day 4, 2 tabs day 5, 1 tab day 6 21 tablet 0  . triamcinolone (KENALOG) 0.1 % paste Use as directed 1 application in the mouth or throat 2 (two) times daily. 5 g 12  . triamcinolone ointment (KENALOG) 0.1 % Apply 1 application topically 2 (two) times daily. 30 g 0   No current facility-administered medications for this visit.    Allergies  Allergen Reactions  . Amoxicillin     REACTION: swelling. angioedema    Family History  Problem Relation Age of Onset  . GER disease Mother   . Colon cancer Neg Hx   . Esophageal cancer Neg Hx   . Inflammatory bowel disease Neg Hx   . Liver disease Neg Hx   . Pancreatic cancer Neg Hx   . Rectal cancer Neg Hx   . Stomach cancer Neg Hx     Social History  Socioeconomic History  . Marital status: Married    Spouse name: Not on file  . Number of children: Not on file  . Years of education: Not on file  . Highest education level: Not on file  Occupational History  . Not on file  Tobacco Use  . Smoking status: Never Smoker  . Smokeless tobacco: Never Used  Substance and Sexual Activity  . Alcohol use: No  . Drug use: No  . Sexual activity: Not on file  Other Topics Concern  . Not on file  Social History Narrative  . Not on file   Social Determinants of Health   Financial Resource Strain:   . Difficulty of Paying Living Expenses:   Food Insecurity:   . Worried About Charity fundraiser in the Last Year:   . Arboriculturist in the Last Year:    Transportation Needs:   . Film/video editor (Medical):   Marland Kitchen Lack of Transportation (Non-Medical):   Physical Activity:   . Days of Exercise per Week:   . Minutes of Exercise per Session:   Stress:   . Feeling of Stress :   Social Connections:   . Frequency of Communication with Friends and Family:   . Frequency of Social Gatherings with Friends and Family:   . Attends Religious Services:   . Active Member of Clubs or Organizations:   . Attends Archivist Meetings:   Marland Kitchen Marital Status:   Intimate Partner Violence:   . Fear of Current or Ex-Partner:   . Emotionally Abused:   Marland Kitchen Physically Abused:   . Sexually Abused:      Constitutional: Pt reports headache. Denies fever, malaise, fatigue, or abrupt weight changes.  HEENT: Pt reports facial pressure, nasal congestion. Denies eye pain, eye redness, ear pain, ringing in the ears, wax buildup, runny nose, bloody nose, or sore throat. Respiratory: Pt reports cough. Denies difficulty breathing, shortness of breath.   Cardiovascular: Denies chest pain, chest tightness, palpitations or swelling in the hands or feet.   No other specific complaints in a complete review of systems (except as listed in HPI above).    Observations/Objective:  Wt Readings from Last 3 Encounters:  01/08/20 187 lb 12.8 oz (85.2 kg)  11/19/19 189 lb (85.7 kg)  10/31/19 192 lb (87.1 kg)    General: Appears her stated age, ill appearing, in NAD. HEENT: Head: normal shape and size, frontal and maxillary sinus tenderness noted;  Nose: congestion noted; Throat/Mouth: hoarseness noted. Pulmonary/Chest: Normal effort. No respiratory distress. Dry cough noted. Neurological: Alert and oriented.   BMET    Component Value Date/Time   NA 138 12/25/2017 0025   K 3.4 (L) 12/25/2017 0025   CL 102 12/25/2017 0025   CO2 27 12/25/2017 0025   GLUCOSE 109 (H) 12/25/2017 0025   BUN 6 12/25/2017 0025   CREATININE 0.74 12/25/2017 0025   CALCIUM 9.2  12/25/2017 0025   GFRNONAA >60 12/25/2017 0025   GFRAA >60 12/25/2017 0025    Lipid Panel  No results found for: CHOL, TRIG, HDL, CHOLHDL, VLDL, LDLCALC  CBC    Component Value Date/Time   WBC 6.8 06/13/2018 0934   RBC 5.16 (H) 06/13/2018 0934   HGB 14.7 06/13/2018 0934   HCT 43.5 06/13/2018 0934   PLT 275.0 06/13/2018 0934   MCV 84.2 06/13/2018 0934   MCH 29.7 12/25/2017 0025   MCHC 33.9 06/13/2018 0934   RDW 14.0 06/13/2018 0934   LYMPHSABS 3.0 06/13/2018 0934  MONOABS 0.3 06/13/2018 0934   EOSABS 0.1 06/13/2018 0934   BASOSABS 0.0 06/13/2018 0934    Hgb A1C No results found for: HGBA1C      Assessment and Plan:  Acute Pansinusitis:  Failed oral steroids, Claritin and Flonase RX for Azithromycin x 5 days  Would want to see her in person if symptoms persist or worsen, return precautions discussed  Follow Up Instructions:    I discussed the assessment and treatment plan with the patient. The patient was provided an opportunity to ask questions and all were answered. The patient agreed with the plan and demonstrated an understanding of the instructions.   The patient was advised to call back or seek an in-person evaluation if the symptoms worsen or if the condition fails to improve as anticipated.    Webb Silversmith, NP

## 2020-01-16 MED FILL — FLUoxetine HCL 10 MG TABS: 10 | 30 days supply | Qty: 30 | Fill #0

## 2020-02-06 ENCOUNTER — Encounter: Payer: Self-pay | Admitting: Internal Medicine

## 2020-02-07 ENCOUNTER — Encounter: Payer: Self-pay | Admitting: Internal Medicine

## 2020-03-05 IMAGING — MG DIGITAL DIAGNOSTIC UNILATERAL RIGHT MAMMOGRAM WITH TOMO AND CAD
4 series · 4 of 12 positions shown · non-contrast
Comparison: Previous exam(s).

CLINICAL DATA: Possible asymmetry in the central right breast on a
recent screening mammogram.

EXAM:
DIGITAL DIAGNOSTIC UNILATERAL RIGHT MAMMOGRAM WITH CAD AND TOMO

[R MLO synth-2D]
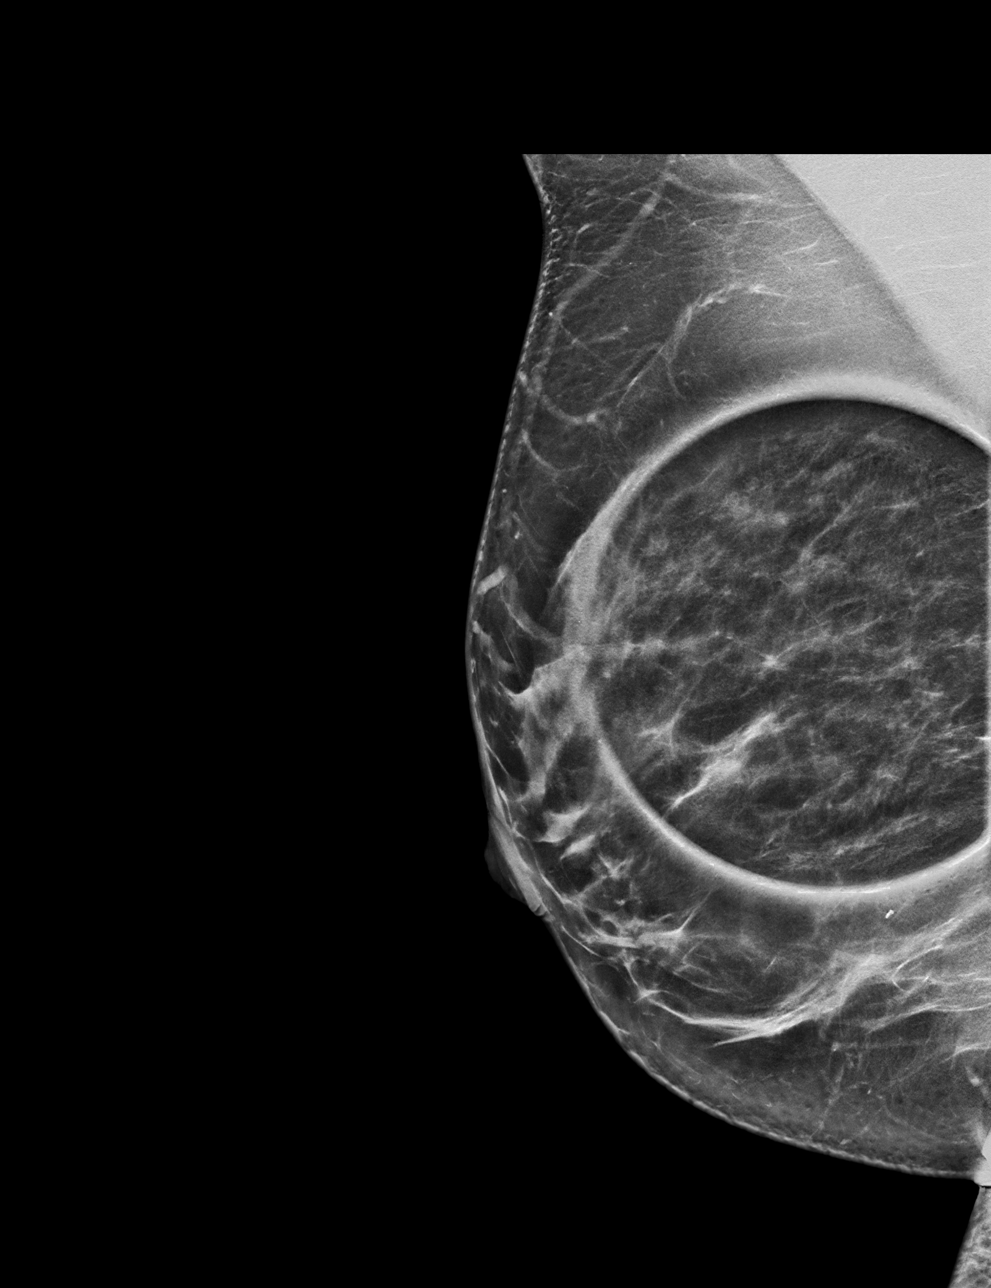

[R CC synth-2D]
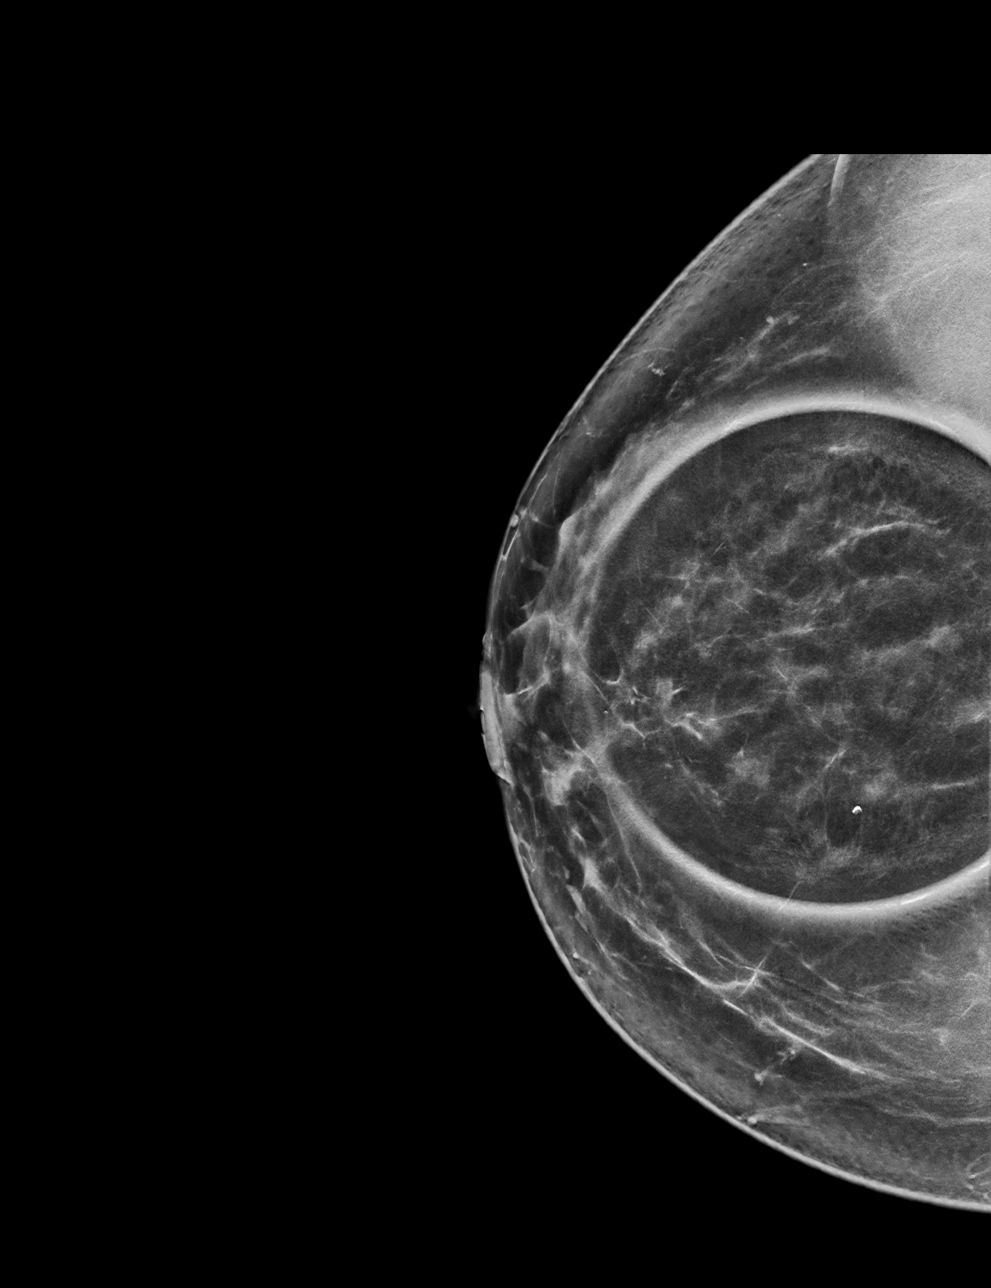

[R MLO tomo · tomo slice 39/76.0]
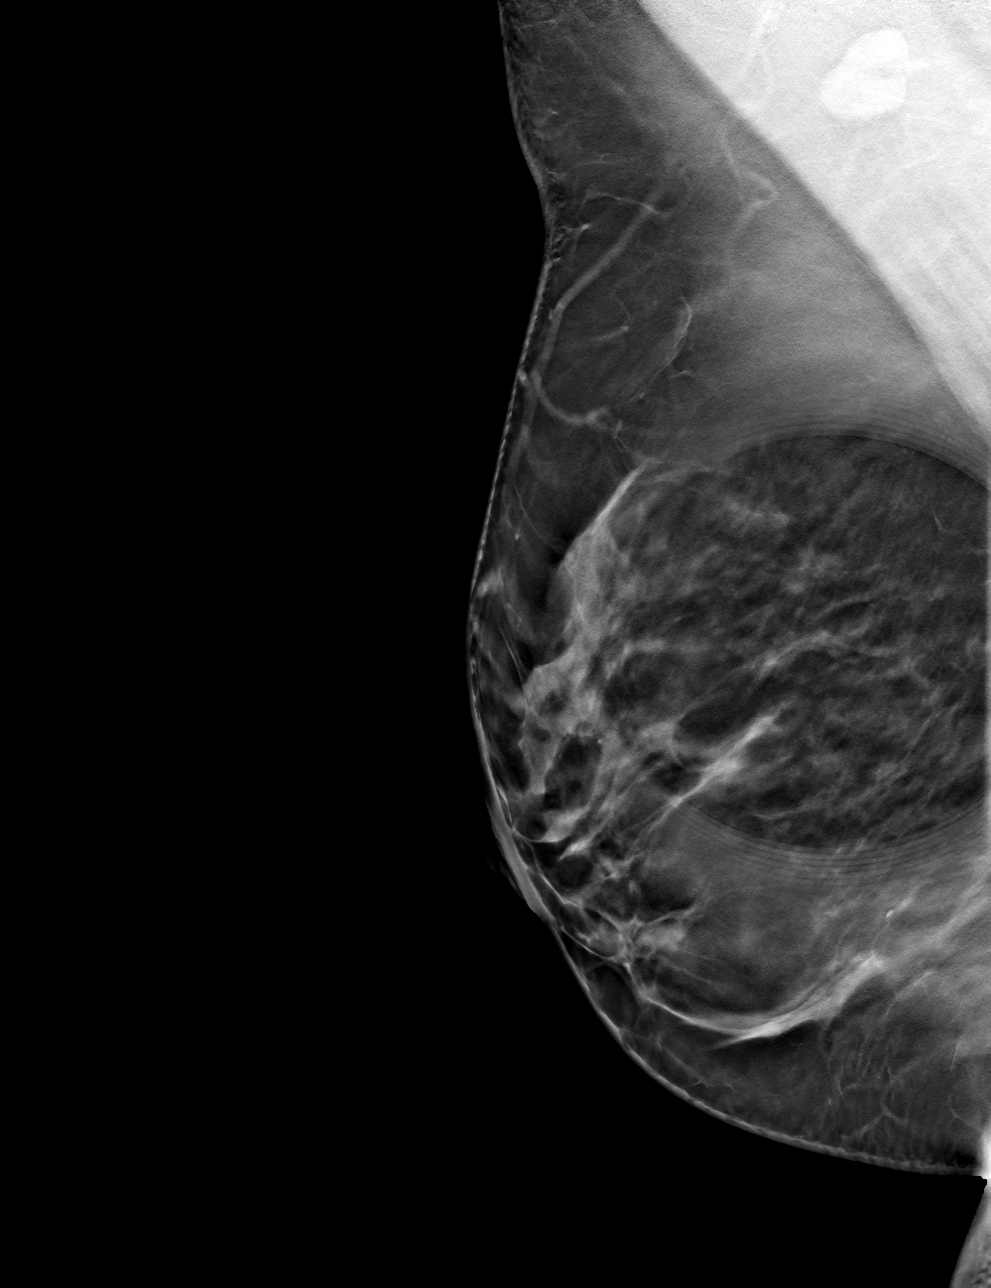

[R CC tomo · tomo slice 42/83.0]
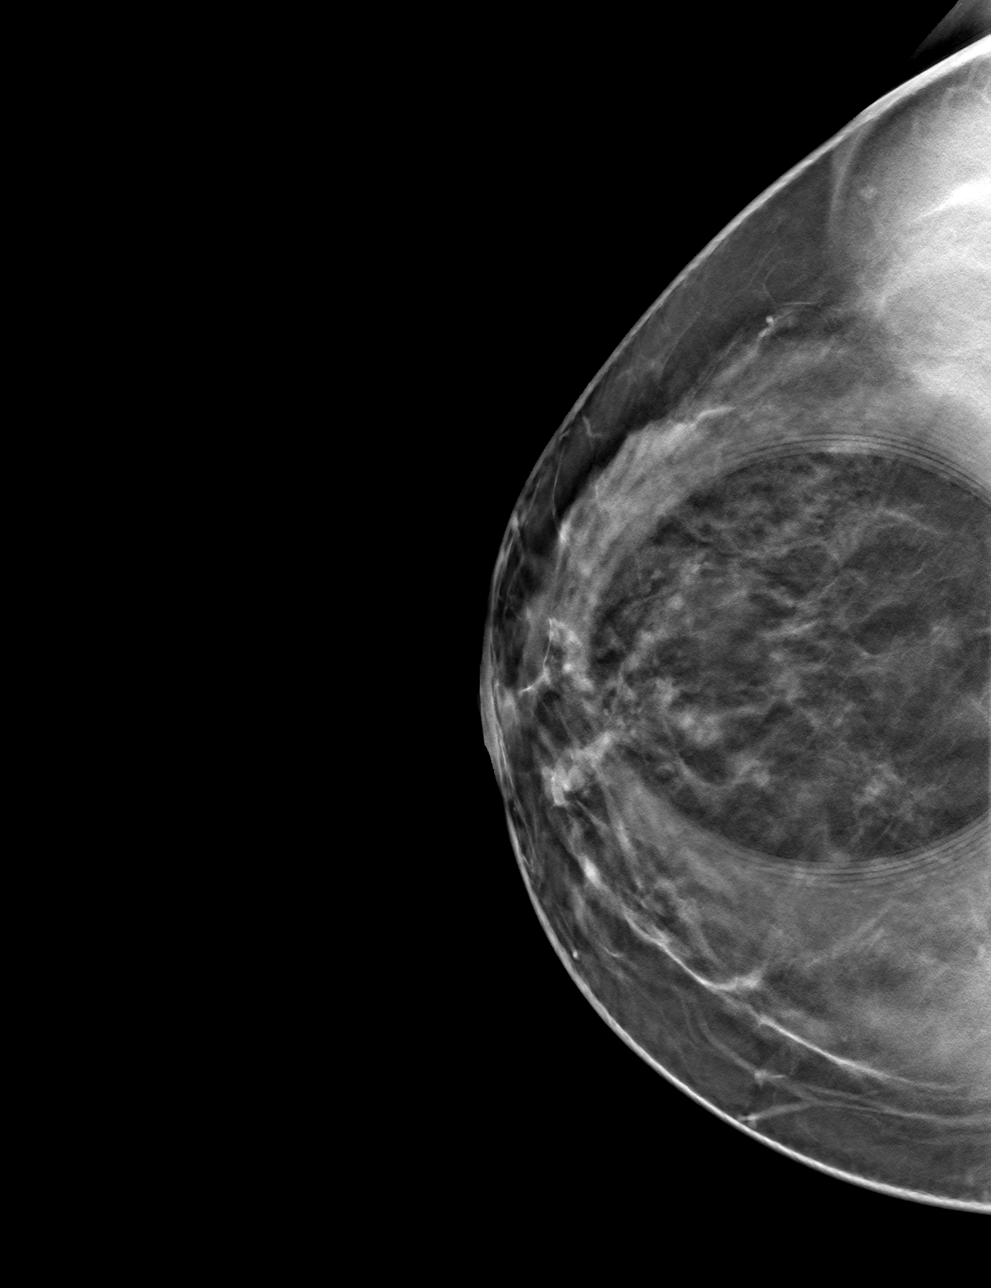

[4 of 12 positions shown; findings below may reference images not displayed]

ACR Breast Density Category c: The breast tissue is heterogeneously
dense, which may obscure small masses.
FINDINGS: Breast 3D tomographic and 2D generated spot compression views of the
right demonstrate normal appearing fibroglandular tissue at the
location of the recently suspected asymmetry.

Mammographic images were processed with CAD.
IMPRESSION: No evidence of malignancy. The recently suspected right breast
asymmetry was close apposition of normal breast tissue.

RECOMMENDATION:
Bilateral screening mammogram in 1 year.

I have discussed the findings and recommendations with the patient.
Results were also provided in writing at the conclusion of the
visit. If applicable, a reminder letter will be sent to the patient
regarding the next appointment.

BI-RADS CATEGORY  1: Negative.

## 2020-03-23 ENCOUNTER — Encounter: Payer: Self-pay | Admitting: Internal Medicine

## 2020-03-26 ENCOUNTER — Other Ambulatory Visit: Payer: Self-pay | Admitting: Obstetrics and Gynecology

## 2020-03-26 DIAGNOSIS — N6310 Unspecified lump in the right breast, unspecified quadrant: Secondary | ICD-10-CM

## 2020-04-03 ENCOUNTER — Encounter: Payer: Self-pay | Admitting: Internal Medicine

## 2020-04-08 ENCOUNTER — Ambulatory Visit: Payer: No Typology Code available for payment source

## 2020-04-08 ENCOUNTER — Ambulatory Visit
Admission: RE | Admit: 2020-04-08 | Discharge: 2020-04-08 | Disposition: A | Discharge status: Home / Self Care | Payer: No Typology Code available for payment source | Source: Ambulatory Visit | Attending: Obstetrics and Gynecology | Admitting: Obstetrics and Gynecology

## 2020-04-08 ENCOUNTER — Other Ambulatory Visit: Payer: Self-pay

## 2020-04-08 DIAGNOSIS — N6310 Unspecified lump in the right breast, unspecified quadrant: Secondary | ICD-10-CM

## 2020-04-23 ENCOUNTER — Ambulatory Visit (INDEPENDENT_AMBULATORY_CARE_PROVIDER_SITE_OTHER): Payer: No Typology Code available for payment source | Admitting: Internal Medicine

## 2020-04-23 ENCOUNTER — Ambulatory Visit (INDEPENDENT_AMBULATORY_CARE_PROVIDER_SITE_OTHER)
Admission: RE | Admit: 2020-04-23 | Discharge: 2020-04-23 | Disposition: A | Discharge status: Home / Self Care | Payer: No Typology Code available for payment source | Source: Ambulatory Visit | Attending: Internal Medicine | Admitting: Internal Medicine

## 2020-04-23 ENCOUNTER — Ambulatory Visit
Admission: RE | Admit: 2020-04-23 | Discharge: 2020-04-23 | Disposition: A | Discharge status: Home / Self Care | Payer: No Typology Code available for payment source | Source: Ambulatory Visit | Attending: Obstetrics and Gynecology | Admitting: Obstetrics and Gynecology

## 2020-04-23 ENCOUNTER — Other Ambulatory Visit: Payer: Self-pay

## 2020-04-23 ENCOUNTER — Encounter: Payer: Self-pay | Admitting: Internal Medicine

## 2020-04-23 VITALS — BP 110/64 | HR 67 | Ht 63.0 in | Wt 190.0 lb

## 2020-04-23 DIAGNOSIS — F419 Anxiety disorder, unspecified: Secondary | ICD-10-CM | POA: Diagnosis not present

## 2020-04-23 DIAGNOSIS — F32A Depression, unspecified: Secondary | ICD-10-CM

## 2020-04-23 DIAGNOSIS — M79671 Pain in right foot: Secondary | ICD-10-CM

## 2020-04-23 DIAGNOSIS — D224 Melanocytic nevi of scalp and neck: Secondary | ICD-10-CM

## 2020-04-23 DIAGNOSIS — L729 Follicular cyst of the skin and subcutaneous tissue, unspecified: Secondary | ICD-10-CM | POA: Diagnosis not present

## 2020-04-23 DIAGNOSIS — M7989 Other specified soft tissue disorders: Secondary | ICD-10-CM | POA: Diagnosis not present

## 2020-04-23 DIAGNOSIS — F329 Major depressive disorder, single episode, unspecified: Secondary | ICD-10-CM

## 2020-04-23 DIAGNOSIS — N6310 Unspecified lump in the right breast, unspecified quadrant: Secondary | ICD-10-CM

## 2020-04-23 MED ORDER — FLUOXETINE HCL 20 MG PO TABS: 20.0000 mg | ORAL_TABLET | Freq: Every day | ORAL | 1 refills | Status: DC

## 2020-04-23 MED FILL — FLUoxetine HCL 20 MG TABS: 20 | 90 days supply | Qty: 90 | Fill #0

## 2020-04-23 NOTE — Progress Notes (Signed)
Subjective:    Patient ID: Vanessa Byrd, female    DOB: 1975-11-20, 44 y.o.   MRN: [id number]  HPI  Pt presents to the clinic today with c/o right foot pain. She has had chronic right foot pain due to plantar fasciitis that worsened after she slipped and fell, landing on the right side of her foot. She describes the pain as aching, throbbing, worse after she has been up on her feet. She has noticed some bruising but no swelling. She denies numbness, tingling or weakness. She has tried ice and Ibuprofen OTC with minimal relief.  She also would like a refill of Fluoxetine. She takes this for anxiety and depression. She does feel like she could benefit from a dose adjustment. She is not currently seeing a therapist. She denies SI/HI.  She also reports a skin lesion of her right arm, right posterior scalp and crown of her head. These lesion have all been there for some time. She has not been evaluated by dermatology.  Review of Systems      Past Medical History:  Diagnosis Date  . Colitis   . Esophagitis   . H. pylori infection   . Sinusitis     Current Outpatient Medications  Medication Sig Dispense Refill  . azithromycin (ZITHROMAX) 250 MG tablet Take 2 tabs today, then 1 tab daily x 4 days 6 tablet 0  . benzonatate (TESSALON) 100 MG capsule Take 1 capsule (100 mg total) by mouth every 8 (eight) hours. 21 capsule 0  . estradiol (ESTRACE) 0.5 MG tablet estradiol 0.5 mg tablet  Take 1 tablet every day by oral route.    . Ferrous Gluconate (IRON 27 PO) iron    . FLUoxetine (PROZAC) 10 MG tablet TAKE 1 TABLET (10 MG TOTAL) BY MOUTH DAILY. 30 tablet 1  . fluticasone (FLONASE) 50 MCG/ACT nasal spray Place 2 sprays into both nostrils daily. 16 g 0  . medroxyPROGESTERone (PROVERA) 2.5 MG tablet     . Multiple Vitamin (MULTIVITAMIN) tablet Take 1 tablet by mouth daily.    . naproxen (NAPROSYN) 500 MG tablet Take 1 tablet (500 mg total) by mouth 2 (two) times daily. 30 tablet  0  . omeprazole (PRILOSEC) 40 MG capsule Take 1 capsule (40 mg total) by mouth 2 (two) times daily. 60 capsule 3  . triamcinolone (KENALOG) 0.1 % paste Use as directed 1 application in the mouth or throat 2 (two) times daily. 5 g 12  . triamcinolone ointment (KENALOG) 0.1 % Apply 1 application topically 2 (two) times daily. 30 g 0   No current facility-administered medications for this visit.    Allergies  Allergen Reactions  . Amoxicillin     REACTION: swelling. angioedema    Family History  Problem Relation Age of Onset  . GER disease Mother   . Colon cancer Neg Hx   . Esophageal cancer Neg Hx   . Inflammatory bowel disease Neg Hx   . Liver disease Neg Hx   . Pancreatic cancer Neg Hx   . Rectal cancer Neg Hx   . Stomach cancer Neg Hx     Social History   Socioeconomic History  . Marital status: Married    Spouse name: Not on file  . Number of children: Not on file  . Years of education: Not on file  . Highest education level: Not on file  Occupational History  . Not on file  Tobacco Use  . Smoking status: Never Smoker  . Smokeless  tobacco: Never Used  Substance and Sexual Activity  . Alcohol use: No  . Drug use: No  . Sexual activity: Not on file  Other Topics Concern  . Not on file  Social History Narrative  . Not on file   Social Determinants of Health   Financial Resource Strain:   . Difficulty of Paying Living Expenses: Not on file  Food Insecurity:   . Worried About Charity fundraiser in the Last Year: Not on file  . Ran Out of Food in the Last Year: Not on file  Transportation Needs:   . Lack of Transportation (Medical): Not on file  . Lack of Transportation (Non-Medical): Not on file  Physical Activity:   . Days of Exercise per Week: Not on file  . Minutes of Exercise per Session: Not on file  Stress:   . Feeling of Stress : Not on file  Social Connections:   . Frequency of Communication with Friends and Family: Not on file  . Frequency of  Social Gatherings with Friends and Family: Not on file  . Attends Religious Services: Not on file  . Active Member of Clubs or Organizations: Not on file  . Attends Archivist Meetings: Not on file  . Marital Status: Not on file  Intimate Partner Violence:   . Fear of Current or Ex-Partner: Not on file  . Emotionally Abused: Not on file  . Physically Abused: Not on file  . Sexually Abused: Not on file     Constitutional: Denies fever, malaise, fatigue, headache or abrupt weight changes.  Respiratory: Denies difficulty breathing, shortness of breath, cough or sputum production.   Cardiovascular: Denies chest pain, chest tightness, palpitations or swelling in the hands or feet.  Gastrointestinal: Denies abdominal pain, bloating, constipation, diarrhea or blood in the stool.  GU: Denies urgency, frequency, pain with urination, burning sensation, blood in urine, odor or discharge. Musculoskeletal: Pt reports right foot pain. Denies decrease in range of motion, difficulty with gait, muscle pain or joint swelling.  Skin: Pt reports abnormal skin lesions. Denies redness, rashes or ulcercations.  Neurological: Denies dizziness, difficulty with memory, difficulty with speech or problems with balance and coordination.  Psych: Pt reports anxiety and depression. Denies SI/HI.  No other specific complaints in a complete review of systems (except as listed in HPI above).  Objective:   Physical Exam   BP 110/64   Pulse 67   Ht 5\' 3"  (1.6 m)   Wt 190 lb (86.2 kg)   SpO2 98%   BMI 33.66 kg/m  Wt Readings from Last 3 Encounters:  04/23/20 190 lb (86.2 kg)  01/08/20 187 lb 12.8 oz (85.2 kg)  11/19/19 189 lb (85.7 kg)    General: Appears her stated age, obese, in NAD. Skin: 1 cm raised irregular lesion with dark discoloration noted under the skin of right posterior upper arm. 1 cm smooth round fluctuant cyst noted of right posterior scalp. 0.5 cm oval, raised, rough lesion noted of  crown of head. Cardiovascular: Normal rate and rhythm.  Pulmonary/Chest: Normal effort and positive vesicular breath sounds. No respiratory distress. No wheezes, rales or ronchi noted.  Musculoskeletal: Normal flexion, extension, rotation of the right ankle. Pain with palpation over the 5th lateral metatarrsal. No signs of joint swelling. No difficulty with gait. Able to stand on heels and toes. Neurological: Alert and oriented.    BMET    Component Value Date/Time   NA 138 12/25/2017 0025   K 3.4 (L)  12/25/2017 0025   CL 102 12/25/2017 0025   CO2 27 12/25/2017 0025   GLUCOSE 109 (H) 12/25/2017 0025   BUN 6 12/25/2017 0025   CREATININE 0.74 12/25/2017 0025   CALCIUM 9.2 12/25/2017 0025   GFRNONAA >60 12/25/2017 0025   GFRAA >60 12/25/2017 0025    Lipid Panel  No results found for: CHOL, TRIG, HDL, CHOLHDL, VLDL, LDLCALC  CBC    Component Value Date/Time   WBC 6.8 06/13/2018 0934   RBC 5.16 (H) 06/13/2018 0934   HGB 14.7 06/13/2018 0934   HCT 43.5 06/13/2018 0934   PLT 275.0 06/13/2018 0934   MCV 84.2 06/13/2018 0934   MCH 29.7 12/25/2017 0025   MCHC 33.9 06/13/2018 0934   RDW 14.0 06/13/2018 0934   LYMPHSABS 3.0 06/13/2018 0934   MONOABS 0.3 06/13/2018 0934   EOSABS 0.1 06/13/2018 0934   BASOSABS 0.0 06/13/2018 0934    Hgb A1C No results found for: HGBA1C         Assessment & Plan:   Acute Right Foot Pain:  Xray today Encouraged RICE therapy  Mass of Right Arm, Cyst of Scalp, Nevus of Scalp:  Referral to dermatology for further evaluation and treatment  Anxiety and Depression:  PHQ 9 score of 5 Support offered Fluoxetine increased to 20 mg daily Update me in 1 month and let me know how you are doing  Will follow up after xray, return precautions discussed Webb Silversmith, NP This visit occurred during the SARS-CoV-2 public health emergency.  Safety protocols were in place, including screening questions prior to the visit, additional usage of staff  PPE, and extensive cleaning of exam room while observing appropriate contact time as indicated for disinfecting solutions.

## 2020-04-23 NOTE — Patient Instructions (Signed)
RICE Therapy for Routine Care of Injuries Many injuries can be cared for with rest, ice, compression, and elevation (RICE therapy). This includes:  Resting the injured part.  Putting ice on the injury.  Putting pressure (compression) on the injury.  Raising the injured part (elevation). Using RICE therapy can help to lessen pain and swelling. Supplies needed:  Ice.  Plastic bag.  Towel.  Elastic bandage.  Pillow or pillows to raise (elevate) your injured body part. How to care for your injury with RICE therapy Rest Limit your normal activities, and try not to use the injured part of your body. You can go back to your normal activities when your doctor says it is okay to do them and you feel okay. Ask your doctor if you should do exercises to help your injury get better. Ice Put ice on the injured area. Do not put ice on your bare skin.  Put ice in a plastic bag.  Place a towel between your skin and the bag.  Leave the ice on for 20 minutes, 2-3 times a day. Use ice on as many days as told by your doctor.  Compression Compression means putting pressure on the injured area. This can be done with an elastic bandage. If an elastic bandage has been put on your injury:  Do not wrap the bandage too tight. Wrap the bandage more loosely if part of your body away from the bandage is blue, swollen, cold, painful, or loses feeling (gets numb).  Take off the bandage and put it on again. Do this every 3-4 hours or as told by your doctor.  See your doctor if the bandage seems to make your problems worse.  Elevation Elevation means keeping the injured area raised. If you can, raise the injured area above your heart or the center of your chest. Contact a doctor if:  You keep having pain and swelling.  Your symptoms get worse. Get help right away if:  You have sudden bad pain at your injury or lower than your injury.  You have redness or more swelling around your injury.  You  have tingling or numbness at your injury or lower than your injury, and it does not go away when you take off the bandage. Summary  Many injuries can be cared for using rest, ice, compression, and elevation (RICE therapy).  You can go back to your normal activities when you feel okay and your doctor says it is okay.  Put ice on the injured area as told by your doctor.  Get help if your symptoms get worse or if you keep having pain and swelling. This information is not intended to replace advice given to you by your health care provider. Make sure you discuss any questions you have with your health care provider. Document Revised: 05/05/2017 Document Reviewed: 05/05/2017 Elsevier Patient Education  2020 [employer]  

## 2020-04-28 ENCOUNTER — Other Ambulatory Visit: Payer: No Typology Code available for payment source

## 2020-05-12 ENCOUNTER — Encounter: Payer: No Typology Code available for payment source | Admitting: Internal Medicine

## 2020-05-21 ENCOUNTER — Ambulatory Visit (INDEPENDENT_AMBULATORY_CARE_PROVIDER_SITE_OTHER): Payer: No Typology Code available for payment source | Admitting: Internal Medicine

## 2020-05-21 ENCOUNTER — Encounter: Payer: Self-pay | Admitting: Internal Medicine

## 2020-05-21 ENCOUNTER — Other Ambulatory Visit: Payer: Self-pay

## 2020-05-21 VITALS — BP 116/74 | HR 66 | Temp 97.7°F | Ht 62.75 in | Wt 187.0 lb

## 2020-05-21 DIAGNOSIS — F329 Major depressive disorder, single episode, unspecified: Secondary | ICD-10-CM

## 2020-05-21 DIAGNOSIS — E611 Iron deficiency: Secondary | ICD-10-CM

## 2020-05-21 DIAGNOSIS — F419 Anxiety disorder, unspecified: Secondary | ICD-10-CM

## 2020-05-21 DIAGNOSIS — G8929 Other chronic pain: Secondary | ICD-10-CM

## 2020-05-21 DIAGNOSIS — F32A Depression, unspecified: Secondary | ICD-10-CM

## 2020-05-21 DIAGNOSIS — Z0000 Encounter for general adult medical examination without abnormal findings: Secondary | ICD-10-CM

## 2020-05-21 DIAGNOSIS — M79671 Pain in right foot: Secondary | ICD-10-CM

## 2020-05-21 DIAGNOSIS — Z23 Encounter for immunization: Secondary | ICD-10-CM | POA: Diagnosis not present

## 2020-05-21 DIAGNOSIS — K2100 Gastro-esophageal reflux disease with esophagitis, without bleeding: Secondary | ICD-10-CM

## 2020-05-21 LAB — FERRITIN: Ferritin: 30.7 ng/mL (ref 10.0–291.0)

## 2020-05-21 LAB — COMPREHENSIVE METABOLIC PANEL
ALT: 28 U/L (ref 0–35)
AST: 15 U/L (ref 0–37)
Albumin: 4.4 g/dL (ref 3.5–5.2)
Alkaline Phosphatase: 79 U/L (ref 39–117)
BUN: 13 mg/dL (ref 6–23)
CO2: 31 mEq/L (ref 19–32)
Calcium: 9.5 mg/dL (ref 8.4–10.5)
Chloride: 105 mEq/L (ref 96–112)
Creatinine, Ser: 0.71 mg/dL (ref 0.40–1.20)
GFR: 89.09 mL/min (ref >60.00)
Glucose, Bld: 96 mg/dL (ref 70–99)
Potassium: 4 mEq/L (ref 3.5–5.1)
Sodium: 142 mEq/L (ref 135–145)
Total Bilirubin: 0.7 mg/dL (ref 0.2–1.2)
Total Protein: 6.8 g/dL (ref 6.0–8.3)

## 2020-05-21 LAB — CBC
HCT: 41.7 % (ref 36.0–46.0)
Hemoglobin: 13.9 g/dL (ref 12.0–15.0)
MCHC: 33.4 g/dL (ref 30.0–36.0)
MCV: 84.7 fl (ref 78.0–100.0)
Platelets: 281 10*3/uL (ref 150.0–400.0)
RBC: 4.93 Mil/uL (ref 3.87–5.11)
RDW: 13.6 % (ref 11.5–15.5)
WBC: 7 10*3/uL (ref 4.0–10.5)

## 2020-05-21 LAB — LIPID PANEL
Cholesterol: 191 mg/dL (ref 0–200)
HDL: 44.5 mg/dL (ref >39.00)
LDL Cholesterol: 124 mg/dL — ABNORMAL HIGH (ref 0–99)
NonHDL: 146.41
Total CHOL/HDL Ratio: 4
Triglycerides: 112 mg/dL (ref 0.0–149.0)
VLDL: 22.4 mg/dL (ref 0.0–40.0)

## 2020-05-21 LAB — TSH: TSH: 1.02 u[IU]/mL (ref 0.35–4.50)

## 2020-05-21 LAB — VITAMIN D 25 HYDROXY (VIT D DEFICIENCY, FRACTURES): VITD: 28.65 ng/mL — ABNORMAL LOW (ref 30.00–100.00)

## 2020-05-21 LAB — HEMOGLOBIN A1C: Hgb A1c MFr Bld: 6.1 % (ref 4.6–6.5)

## 2020-05-21 LAB — IBC PANEL
Iron: 63 ug/dL (ref 42–145)
Saturation Ratios: 15 % — ABNORMAL LOW (ref 20.0–50.0)
Transferrin: 300 mg/dL (ref 212.0–360.0)

## 2020-05-21 LAB — VITAMIN B12: Vitamin B-12: 194 pg/mL — ABNORMAL LOW (ref 211–911)

## 2020-05-21 MED ORDER — MELOXICAM 7.5 MG PO TABS: 7.5000 mg | ORAL_TABLET | Freq: Every day | ORAL | 0 refills | Status: DC

## 2020-05-21 MED FILL — MELOXICAM 7.5 MG TABLET: 7.5 | 30 days supply | Qty: 30 | Fill #0

## 2020-05-21 NOTE — Patient Instructions (Signed)
Health Maintenance, Female Adopting a healthy lifestyle and getting preventive care are important in promoting health and wellness. Ask your health care provider about:  The right schedule for you to have regular tests and exams.  Things you can do on your own to prevent diseases and keep yourself healthy. What should I know about diet, weight, and exercise? Eat a healthy diet   Eat a diet that includes plenty of vegetables, fruits, low-fat dairy products, and lean protein.  Do not eat a lot of foods that are high in solid fats, added sugars, or sodium. Maintain a healthy weight Body mass index (BMI) is used to identify weight problems. It estimates body fat based on height and weight. Your health care provider can help determine your BMI and help you achieve or maintain a healthy weight. Get regular exercise Get regular exercise. This is one of the most important things you can do for your health. Most adults should:  Exercise for at least 150 minutes each week. The exercise should increase your heart rate and make you sweat (moderate-intensity exercise).  Do strengthening exercises at least twice a week. This is in addition to the moderate-intensity exercise.  Spend less time sitting. Even light physical activity can be beneficial. Watch cholesterol and blood lipids Have your blood tested for lipids and cholesterol at 45 years of age, then have this test every 5 years. Have your cholesterol levels checked more often if:  Your lipid or cholesterol levels are high.  You are older than 45 years of age.  You are at high risk for heart disease. What should I know about cancer screening? Depending on your health history and family history, you may need to have cancer screening at various ages. This may include screening for:  Breast cancer.  Cervical cancer.  Colorectal cancer.  Skin cancer.  Lung cancer. What should I know about heart disease, diabetes, and high blood  pressure? Blood pressure and heart disease  High blood pressure causes heart disease and increases the risk of stroke. This is more likely to develop in people who have high blood pressure readings, are of African descent, or are overweight.  Have your blood pressure checked: ? Every 3-5 years if you are 18-39 years of age. ? Every year if you are 40 years old or older. Diabetes Have regular diabetes screenings. This checks your fasting blood sugar level. Have the screening done:  Once every three years after age 40 if you are at a normal weight and have a low risk for diabetes.  More often and at a younger age if you are overweight or have a high risk for diabetes. What should I know about preventing infection? Hepatitis B If you have a higher risk for hepatitis B, you should be screened for this virus. Talk with your health care provider to find out if you are at risk for hepatitis B infection. Hepatitis C Testing is recommended for:  Everyone born from 1945 through 1965.  Anyone with known risk factors for hepatitis C. Sexually transmitted infections (STIs)  Get screened for STIs, including gonorrhea and chlamydia, if: ? You are sexually active and are younger than 45 years of age. ? You are older than 45 years of age and your health care provider tells you that you are at risk for this type of infection. ? Your sexual activity has changed since you were last screened, and you are at increased risk for chlamydia or gonorrhea. Ask your health care provider if   you are at risk.  Ask your health care provider about whether you are at high risk for HIV. Your health care provider may recommend a prescription medicine to help prevent HIV infection. If you choose to take medicine to prevent HIV, you should first get tested for HIV. You should then be tested every 3 months for as long as you are taking the medicine. Pregnancy  If you are about to stop having your period (premenopausal) and  you may become pregnant, seek counseling before you get pregnant.  Take 400 to 800 micrograms (mcg) of folic acid every day if you become pregnant.  Ask for birth control (contraception) if you want to prevent pregnancy. Osteoporosis and menopause Osteoporosis is a disease in which the bones lose minerals and strength with aging. This can result in bone fractures. If you are 65 years old or older, or if you are at risk for osteoporosis and fractures, ask your health care provider if you should:  Be screened for bone loss.  Take a calcium or vitamin D supplement to lower your risk of fractures.  Be given hormone replacement therapy (HRT) to treat symptoms of menopause. Follow these instructions at home: Lifestyle  Do not use any products that contain nicotine or tobacco, such as cigarettes, e-cigarettes, and chewing tobacco. If you need help quitting, ask your health care provider.  Do not use street drugs.  Do not share needles.  Ask your health care provider for help if you need support or information about quitting drugs. Alcohol use  Do not drink alcohol if: ? Your health care provider tells you not to drink. ? You are pregnant, may be pregnant, or are planning to become pregnant.  If you drink alcohol: ? Limit how much you use to 0-1 drink a day. ? Limit intake if you are breastfeeding.  Be aware of how much alcohol is in your drink. In the [place] one drink equals one 12 oz bottle of beer (355 mL), one 5 oz glass of wine (148 mL), or one 1 oz glass of hard liquor (44 mL). General instructions  Schedule regular health, dental, and eye exams.  Stay current with your vaccines.  Tell your health care provider if: ? You often feel depressed. ? You have ever been abused or do not feel safe at home. Summary  Adopting a healthy lifestyle and getting preventive care are important in promoting health and wellness.  Follow your health care provider's instructions about healthy  diet, exercising, and getting tested or screened for diseases.  Follow your health care provider's instructions on monitoring your cholesterol and blood pressure. This information is not intended to replace advice given to you by your health care provider. Make sure you discuss any questions you have with your health care provider. Document Revised: 08/08/2018 Document Reviewed: 08/08/2018 Elsevier Patient Education  2020 [employer]  

## 2020-05-21 NOTE — Assessment & Plan Note (Signed)
CBC and C met today Can continue Omeprazole as needed, may need to consider taking daily if you notice an increase in reflux after starting Meloxicam

## 2020-05-21 NOTE — Addendum Note (Signed)
Addended by: Lurlean Nanny on: 05/21/2020 03:58 PM   Modules accepted: Orders

## 2020-05-21 NOTE — Assessment & Plan Note (Signed)
Continue Fluoxetine C met today Support offered

## 2020-05-21 NOTE — Assessment & Plan Note (Signed)
We will try Meloxicam 7.5 mg p.o. daily Advised her to avoid NSAIDs OTC If no improvement after 1 month, consider referral to podiatry for further evaluation

## 2020-05-21 NOTE — Progress Notes (Signed)
Subjective:    Patient ID: Vanessa Byrd, female    DOB: [birth year], 44 y.o.   MRN: [id number]  HPI  Pt presents to the clinic today for her annual exam. She is also due to follow up chronic conditions.  Iron Deficiency Anemia: Her last H/H was 14.7/43.5, 05/2018. She has not been taking her oral iron supplement. She has been fatigued but denies weakness, cold intolerance, easy bruising or SOB. She reports she sleeps well at night. She does not feel rested when she wakes up. She has not been told she snores or stops breathing during the night.  Anxiety and Depression: Chronic, managed on Fluoxetine. She is not currently seeing a therapist. She denies SI/HI.  GERD: Intermittent. She only takes Omeprazole as needed. Upper GI from 08/2018 reviewed.  Chronic Right Foot Pain: Xray from 03/2020 was negative. She has not been taking any medication OTC for this. She has not seen podiatry.  Flu: 05/2019 Tetanus: > 10 years Covid: Pfizer Pap Smear: 2020 [facility] G[facility] Mammogram: 03/2020 Colon Screening: 08/2018 Vision Screening: annually Dentist: annually  Diet: She does eat meat. She consumes fruits and veggies. She does eat some fried foods. She drinks mostly water, soda. Exercise: Personal trainer 2-3 times per week  Review of Systems  Past Medical History:  Diagnosis Date  . Colitis   . Esophagitis   . H. pylori infection   . Sinusitis     Current Outpatient Medications  Medication Sig Dispense Refill  . azithromycin (ZITHROMAX) 250 MG tablet Take 2 tabs today, then 1 tab daily x 4 days 6 tablet 0  . benzonatate (TESSALON) 100 MG capsule Take 1 capsule (100 mg total) by mouth every 8 (eight) hours. 21 capsule 0  . estradiol (ESTRACE) 0.5 MG tablet estradiol 0.5 mg tablet  Take 1 tablet every day by oral route.    . Ferrous Gluconate (IRON 27 PO) iron    . FLUoxetine (PROZAC) 20 MG tablet Take 1 tablet (20 mg total) by mouth daily. 90 tablet 1  . fluticasone  (FLONASE) 50 MCG/ACT nasal spray Place 2 sprays into both nostrils daily. 16 g 0  . medroxyPROGESTERone (PROVERA) 2.5 MG tablet     . Multiple Vitamin (MULTIVITAMIN) tablet Take 1 tablet by mouth daily.    . naproxen (NAPROSYN) 500 MG tablet Take 1 tablet (500 mg total) by mouth 2 (two) times daily. 30 tablet 0  . omeprazole (PRILOSEC) 40 MG capsule Take 1 capsule (40 mg total) by mouth 2 (two) times daily. 60 capsule 3  . triamcinolone (KENALOG) 0.1 % paste Use as directed 1 application in the mouth or throat 2 (two) times daily. 5 g 12  . triamcinolone ointment (KENALOG) 0.1 % Apply 1 application topically 2 (two) times daily. 30 g 0   No current facility-administered medications for this visit.    Allergies  Allergen Reactions  . Amoxicillin     REACTION: swelling. angioedema    Family History  Problem Relation Age of Onset  . GER disease Mother   . Colon cancer Neg Hx   . Esophageal cancer Neg Hx   . Inflammatory bowel disease Neg Hx   . Liver disease Neg Hx   . Pancreatic cancer Neg Hx   . Rectal cancer Neg Hx   . Stomach cancer Neg Hx     Social History   Socioeconomic History  . Marital status: Married    Spouse name: Not on file  . Number of children: Not  on file  . Years of education: Not on file  . Highest education level: Not on file  Occupational History  . Not on file  Tobacco Use  . Smoking status: Never Smoker  . Smokeless tobacco: Never Used  Substance and Sexual Activity  . Alcohol use: No  . Drug use: No  . Sexual activity: Not on file  Other Topics Concern  . Not on file  Social History Narrative  . Not on file   Social Determinants of Health   Financial Resource Strain:   . Difficulty of Paying Living Expenses: Not on file  Food Insecurity:   . Worried About Programme researcher, broadcasting/film/video in the Last Year: Not on file  . Ran Out of Food in the Last Year: Not on file  Transportation Needs:   . Lack of Transportation (Medical): Not on file  . Lack of  Transportation (Non-Medical): Not on file  Physical Activity:   . Days of Exercise per Week: Not on file  . Minutes of Exercise per Session: Not on file  Stress:   . Feeling of Stress : Not on file  Social Connections:   . Frequency of Communication with Friends and Family: Not on file  . Frequency of Social Gatherings with Friends and Family: Not on file  . Attends Religious Services: Not on file  . Active Member of Clubs or Organizations: Not on file  . Attends Banker Meetings: Not on file  . Marital Status: Not on file  Intimate Partner Violence:   . Fear of Current or Ex-Partner: Not on file  . Emotionally Abused: Not on file  . Physically Abused: Not on file  . Sexually Abused: Not on file     Constitutional: Pt reports fatigue. Denies fever, malaise, headache or abrupt weight changes.  HEENT: Denies eye pain, eye redness, ear pain, ringing in the ears, wax buildup, runny nose, nasal congestion, bloody nose, or sore throat. Respiratory: Denies difficulty breathing, shortness of breath, cough or sputum production.   Cardiovascular: Denies chest pain, chest tightness, palpitations or swelling in the hands or feet.  Gastrointestinal: Pt reports intermittent reflux. Denies abdominal pain, bloating, constipation, diarrhea or blood in the stool.  GU: Denies urgency, frequency, pain with urination, burning sensation, blood in urine, odor or discharge. Musculoskeletal: Pt reports chronic right foot pain. Denies decrease in range of motion, difficulty with gait, muscle pain or joint swelling.  Skin: Denies redness, rashes, lesions or ulcercations.  Neurological: Denies dizziness, difficulty with memory, difficulty with speech or problems with balance and coordination.  Psych: Pt has a history of anxiety and depression. Denies SI/HI.  No other specific complaints in a complete review of systems (except as listed in HPI above).     Objective:   Physical Exam  BP 116/74    Pulse 66   Temp 97.7 F (36.5 C) (Temporal)   Ht 5' 2.75" (1.594 m)   Wt 187 lb (84.8 kg)   SpO2 98%   BMI 33.39 kg/m   Wt Readings from Last 3 Encounters:  04/23/20 190 lb (86.2 kg)  01/08/20 187 lb 12.8 oz (85.2 kg)  11/19/19 189 lb (85.7 kg)    General: Appears her stated age, obese, in NAD. Skin: Warm, dry and intact. No rashes noted. HEENT: Head: normal shape and size; Eyes: sclera white, no icterus, conjunctiva pink, PERRLA and EOMs intact;  Neck:  Neck supple, trachea midline. No masses, lumps present. Borderline thyromegaly noted. Cardiovascular: Normal rate and rhythm.  S1,S2 noted.  No murmur, rubs or gallops noted. No JVD or BLE edema.  Pulmonary/Chest: Normal effort and positive vesicular breath sounds. No respiratory distress. No wheezes, rales or ronchi noted.  Abdomen: Soft and nontender. Normal bowel sounds. No distention or masses noted. Liver, spleen and kidneys non palpable. Musculoskeletal: Pain with palpation over the right 5th metatarsal. Strength 5/5 BUE/BLE. No difficulty with gait.  Neurological: Alert and oriented. Cranial nerves II-XII grossly intact. Coordination normal.  Psychiatric: Mood and affect normal. Behavior is normal. Judgment and thought content normal.    BMET    Component Value Date/Time   NA 138 12/25/2017 0025   K 3.4 (L) 12/25/2017 0025   CL 102 12/25/2017 0025   CO2 27 12/25/2017 0025   GLUCOSE 109 (H) 12/25/2017 0025   BUN 6 12/25/2017 0025   CREATININE 0.74 12/25/2017 0025   CALCIUM 9.2 12/25/2017 0025   GFRNONAA >60 12/25/2017 0025   GFRAA >60 12/25/2017 0025    Lipid Panel  No results found for: CHOL, TRIG, HDL, CHOLHDL, VLDL, LDLCALC  CBC    Component Value Date/Time   WBC 6.8 06/13/2018 0934   RBC 5.16 (H) 06/13/2018 0934   HGB 14.7 06/13/2018 0934   HCT 43.5 06/13/2018 0934   PLT 275.0 06/13/2018 0934   MCV 84.2 06/13/2018 0934   MCH 29.7 12/25/2017 0025   MCHC 33.9 06/13/2018 0934   RDW 14.0 06/13/2018 0934     LYMPHSABS 3.0 06/13/2018 0934   MONOABS 0.3 06/13/2018 0934   EOSABS 0.1 06/13/2018 0934   BASOSABS 0.0 06/13/2018 0934    Hgb A1C No results found for: HGBA1C         Assessment & Plan:   Preventative Health Maintenance:  Flu shot today Tdap today Covid vaccine UTD Pap smear UTD, will request copy Mammogram UTD Colon screening UTD Encouraged her to consume a balanced diet and exercise regimen Advised her to see an eye doctor and dentist annually We will check CBC, C met, TSH, lipid, A1c and vitamin D today  Fatigue:  We will check CBC, IBC panel, ferritin, B12 and vitamin D today If labs normal, consider referral to pulmonology for sleep study  RTC in 1 year, sooner if needed Webb Silversmith, NP This visit occurred during the SARS-CoV-2 public health emergency.  Safety protocols were in place, including screening questions prior to the visit, additional usage of staff PPE, and extensive cleaning of exam room while observing appropriate contact time as indicated for disinfecting solutions.

## 2020-05-21 NOTE — Assessment & Plan Note (Signed)
We will check CBC, IBC panel, ferritin and B12 today I will let her know if she needs to restart oral iron

## 2020-05-26 ENCOUNTER — Encounter: Payer: Self-pay | Admitting: Internal Medicine

## 2020-07-07 ENCOUNTER — Other Ambulatory Visit: Payer: Self-pay

## 2020-07-07 ENCOUNTER — Ambulatory Visit (HOSPITAL_COMMUNITY)
Admission: EM | Admit: 2020-07-07 | Discharge: 2020-07-07 | Disposition: A | Discharge status: Home / Self Care | Payer: No Typology Code available for payment source | Attending: Family Medicine | Admitting: Family Medicine

## 2020-07-07 ENCOUNTER — Other Ambulatory Visit (HOSPITAL_COMMUNITY): Payer: Self-pay | Admitting: Family Medicine

## 2020-07-07 ENCOUNTER — Encounter: Payer: Self-pay | Admitting: Internal Medicine

## 2020-07-07 ENCOUNTER — Encounter (HOSPITAL_COMMUNITY): Payer: Self-pay | Admitting: Emergency Medicine

## 2020-07-07 DIAGNOSIS — Z20822 Contact with and (suspected) exposure to covid-19: Secondary | ICD-10-CM | POA: Insufficient documentation

## 2020-07-07 DIAGNOSIS — H8110 Benign paroxysmal vertigo, unspecified ear: Secondary | ICD-10-CM | POA: Diagnosis not present

## 2020-07-07 DIAGNOSIS — Z880 Allergy status to penicillin: Secondary | ICD-10-CM | POA: Diagnosis not present

## 2020-07-07 DIAGNOSIS — R519 Headache, unspecified: Secondary | ICD-10-CM | POA: Diagnosis present

## 2020-07-07 DIAGNOSIS — Z79899 Other long term (current) drug therapy: Secondary | ICD-10-CM | POA: Insufficient documentation

## 2020-07-07 DIAGNOSIS — J069 Acute upper respiratory infection, unspecified: Secondary | ICD-10-CM | POA: Insufficient documentation

## 2020-07-07 LAB — SARS CORONAVIRUS 2 (TAT 6-24 HRS): SARS Coronavirus 2: NEGATIVE

## 2020-07-07 MED ORDER — IBUPROFEN 600 MG PO TABS: 600.0000 mg | ORAL_TABLET | Freq: Four times a day (QID) | ORAL | 0 refills | Status: DC | PRN

## 2020-07-07 MED ORDER — MECLIZINE HCL 12.5 MG PO TABS: 12.5000 mg | ORAL_TABLET | Freq: Three times a day (TID) | ORAL | 0 refills | Status: DC | PRN

## 2020-07-07 MED FILL — IBUPROFEN 600 MG TABLET: 600 | 7 days supply | Qty: 30 | Fill #0

## 2020-07-07 NOTE — ED Triage Notes (Signed)
Pt states she was at work this morning and had a headache. She states she kept working but then began to feel dizzy and nauseous. She is still feeling nauseous. She denies any fever or cough. She has had some nasal drainage.

## 2020-07-07 NOTE — Discharge Instructions (Signed)
Continue Claritin once a day Use Flonase as directed Drink plenty of fluids Take meclizine (Antivert) as needed for dizziness Take ibuprofen as needed for headache Get plenty of rest Follow-up with your primary care if you fail to improve over the next few days Check MyChart for your test results

## 2020-07-07 NOTE — ED Provider Notes (Signed)
[facility]    CSN: [id number] Arrival date & time: 07/07/20  1300      History   Chief Complaint Chief Complaint  Patient presents with  . Headache  . Dizziness  . Nausea    HPI Vanessa Byrd is a 44 y.o. female.   HPI   Patient states that she has allergies.  Frequent sinus infections.  States she has been coming down with some sinus symptoms for the last 5 days.  She has had some nasal congestion and postnasal drip.  She states that starting yesterday she developed a headache.  Today she has burning and vertigo, nausea, and had a very hot spell where she felt like she was going to faint at work.  No nausea.  No vomiting.  Decreased appetite.  Is able to eat and drink normally.  No fever or chills.  No shortness of breath.  Patient has had Covid vaccinations.  No known exposure to Covid.  No chest pain, palpitations, shortness of breath.  She has been taking Claritin.  Does not know that this helps.  Past Medical History:  Diagnosis Date  . Colitis   . Esophagitis   . H. pylori infection   . Sinusitis     Patient Active Problem List   Diagnosis Date Noted  . Chronic foot pain, right 05/21/2020  . Iron deficiency 07/19/2018  . Gastroesophageal reflux disease with esophagitis 06/13/2018  . Anxiety and depression 05/31/2013  . ALLERGIC RHINITIS 11/24/2009    Past Surgical History:  Procedure Laterality Date  . TONSILLECTOMY    . WISDOM TOOTH EXTRACTION      OB History   No obstetric history on file.      Home Medications    Prior to Admission medications   Medication Sig Start Date End Date Taking? Authorizing Provider  Ferrous Gluconate (IRON 27 PO) iron   Yes [provider]  FLUoxetine (PROZAC) 20 MG tablet Take 1 tablet (20 mg total) by mouth daily. 04/23/20  Yes Jearld Fenton, NP  Multiple Vitamin (MULTIVITAMIN) tablet Take 1 tablet by mouth daily.   Yes [provider]  fluticasone (FLONASE) 50 MCG/ACT nasal  spray Place 2 sprays into both nostrils daily. 09/17/17   Tasia Catchings, Amy V, PA-C  ibuprofen (ADVIL) 600 MG tablet Take 1 tablet (600 mg total) by mouth every 6 (six) hours as needed. 07/07/20   Raylene Everts, MD  meclizine (ANTIVERT) 12.5 MG tablet Take 1-2 tablets (12.5-25 mg total) by mouth 3 (three) times daily as needed for dizziness. 07/07/20   Raylene Everts, MD  omeprazole (PRILOSEC) 40 MG capsule Take 1 capsule (40 mg total) by mouth 2 (two) times daily. 06/13/18 07/07/20  Mansouraty, Telford Nab., MD    Family History Family History  Problem Relation Age of Onset  . GER disease Mother   . Colon cancer Neg Hx   . Esophageal cancer Neg Hx   . Inflammatory bowel disease Neg Hx   . Liver disease Neg Hx   . Pancreatic cancer Neg Hx   . Rectal cancer Neg Hx   . Stomach cancer Neg Hx     Social History Social History   Tobacco Use  . Smoking status: Never Smoker  . Smokeless tobacco: Never Used  Substance Use Topics  . Alcohol use: No  . Drug use: No     Allergies   Amoxicillin and Penicillins   Review of Systems Review of Systems See HPI  Physical Exam Triage  Vital Signs ED Triage Vitals  Enc Vitals Group     BP 07/07/20 1410 140/88     Pulse Rate 07/07/20 1410 63     Resp 07/07/20 1410 17     Temp 07/07/20 1410 98.1 F (36.7 C)     Temp Source 07/07/20 1410 Oral     SpO2 07/07/20 1410 100 %     Weight --      Height --      Head Circumference --      Peak Flow --      Pain Score 07/07/20 1411 2     Pain Loc --      Pain Edu? --      Excl. in [place]? --    No data found.  Updated Vital Signs BP 140/88 (BP Location: Left Arm)   Pulse 63   Temp 98.1 F (36.7 C) (Oral)   Resp 17   SpO2 100%      Physical Exam Constitutional:      General: She is not in acute distress.    Appearance: She is well-developed and normal weight.  HENT:     Head: Normocephalic and atraumatic.     Right Ear: Tympanic membrane, ear canal and external ear normal.     Left  Ear: Tympanic membrane, ear canal and external ear normal.     Nose: Congestion and rhinorrhea present.     Comments: Mild nasal congestion.  Mild sinus tenderness.  Clear rhinorrhea    Mouth/Throat:     Mouth: Mucous membranes are moist.     Pharynx: No posterior oropharyngeal erythema.     Comments: Tonsils are surgically absent.  No erythema Eyes:     Conjunctiva/sclera: Conjunctivae normal.     Pupils: Pupils are equal, round, and reactive to light.  Cardiovascular:     Rate and Rhythm: Normal rate and regular rhythm.     Heart sounds: Normal heart sounds.     Comments: Heart and lung exam are normal Pulmonary:     Effort: Pulmonary effort is normal. No respiratory distress.     Breath sounds: Normal breath sounds.  Abdominal:     Palpations: Abdomen is soft.  Musculoskeletal:        General: Normal range of motion.     Cervical back: Normal range of motion.  Lymphadenopathy:     Cervical: No cervical adenopathy.  Skin:    General: Skin is warm and dry.  Neurological:     General: No focal deficit present.     Mental Status: She is alert.     Gait: Gait normal.     Deep Tendon Reflexes: Reflexes normal.  Psychiatric:        Mood and Affect: Mood normal.        Behavior: Behavior normal.      UC Treatments / Results  Labs (all labs ordered are listed, but only abnormal results are displayed) Labs Reviewed  SARS CORONAVIRUS 2 (TAT 6-24 HRS)    EKG   Radiology No results found.  Procedures Procedures (including critical care time)  Medications Ordered in UC Medications - No data to display  Initial Impression / Assessment and Plan / UC Course  I have reviewed the triage vital signs and the nursing notes.  Pertinent labs & imaging results that were available during my care of the patient were reviewed by me and considered in my medical decision making (see chart for details).     Likely viral upper respiratory  infection.  Allergies may be at play.   Patient has vertigo.  Will add Flonase.  Ibuprofen for headache.  Meclizine for vertigo.  Rest and fluids.  We will do a Covid test as this is needed to return to work.  Patient does work at the hospital.  Off work for 2 days.  Call PCP if fail to improve.  Discussed there is no indication for antibiotics at this time Final Clinical Impressions(s) / UC Diagnoses   Final diagnoses:  Benign paroxysmal positional vertigo, unspecified laterality  Acute upper respiratory infection  Bad headache     Discharge Instructions     Continue Claritin once a day Use Flonase as directed Drink plenty of fluids Take meclizine (Antivert) as needed for dizziness Take ibuprofen as needed for headache Get plenty of rest Follow-up with your primary care if you fail to improve over the next few days Check MyChart for your test results   ED Prescriptions    Medication Sig Dispense Auth. Provider   meclizine (ANTIVERT) 12.5 MG tablet Take 1-2 tablets (12.5-25 mg total) by mouth 3 (three) times daily as needed for dizziness. 30 tablet Raylene Everts, MD   ibuprofen (ADVIL) 600 MG tablet Take 1 tablet (600 mg total) by mouth every 6 (six) hours as needed. 30 tablet Raylene Everts, MD     PDMP not reviewed this encounter.   Raylene Everts, MD 07/07/20 1505

## 2020-07-10 ENCOUNTER — Other Ambulatory Visit (HOSPITAL_COMMUNITY): Payer: Self-pay | Admitting: Pediatrics

## 2020-07-10 ENCOUNTER — Other Ambulatory Visit: Payer: Self-pay | Admitting: Pediatrics

## 2020-07-16 ENCOUNTER — Other Ambulatory Visit (HOSPITAL_COMMUNITY): Payer: Self-pay | Admitting: Obstetrics & Gynecology

## 2020-07-16 ENCOUNTER — Other Ambulatory Visit: Payer: Self-pay | Admitting: Obstetrics & Gynecology

## 2020-07-16 DIAGNOSIS — R102 Pelvic and perineal pain: Secondary | ICD-10-CM

## 2020-07-21 ENCOUNTER — Other Ambulatory Visit: Payer: Self-pay | Admitting: Internal Medicine

## 2020-07-21 ENCOUNTER — Encounter: Payer: Self-pay | Admitting: Internal Medicine

## 2020-07-21 ENCOUNTER — Telehealth (INDEPENDENT_AMBULATORY_CARE_PROVIDER_SITE_OTHER): Payer: No Typology Code available for payment source | Admitting: Internal Medicine

## 2020-07-21 DIAGNOSIS — B9689 Other specified bacterial agents as the cause of diseases classified elsewhere: Secondary | ICD-10-CM | POA: Diagnosis not present

## 2020-07-21 DIAGNOSIS — J019 Acute sinusitis, unspecified: Secondary | ICD-10-CM

## 2020-07-21 DIAGNOSIS — R42 Dizziness and giddiness: Secondary | ICD-10-CM

## 2020-07-21 MED ORDER — PREDNISONE 10 MG PO TABS: ORAL_TABLET | ORAL | 0 refills | Status: DC

## 2020-07-21 MED ORDER — DOXYCYCLINE HYCLATE 100 MG PO TABS: 100.0000 mg | ORAL_TABLET | Freq: Two times a day (BID) | ORAL | 0 refills | Status: DC

## 2020-07-21 MED FILL — DOXYCYCLINE HYCLATE 100 MG: 100 | 10 days supply | Qty: 20 | Fill #0

## 2020-07-21 MED FILL — predniSONE 10 MG TABS: 10 | 6 days supply | Qty: 21 | Fill #0

## 2020-07-21 NOTE — Progress Notes (Signed)
Subjective:    Patient ID: Vanessa Byrd, female    DOB: 01-May-1976, 44 y.o.   MRN: [id number]  Virtual Visit via Video Note  I connected with Vanessa Byrd on 07/21/20 at 11:15 AM EST by a video enabled telemedicine application and verified that I am speaking with the correct person using two identifiers.  Location: Patient: Work Provider: Buyer, retail in this video call: Webb Silversmith, NP and Thamas Jaegers.   I discussed the limitations of evaluation and management by telemedicine and the availability of in person appointments. The patient expressed understanding and agreed to proceed.   HPI  Pt presents to the clinic today for UC followup. She went to the [facility]/9 with c/o headache, URI symptoms and dizziness. Covid test was negative. She was diagnosed with a viral sinus infection, advised rest, fluids, Claritin, Flonase, Ibuprofen and Meclizine. Since that time, she reports facial pain and pressure, blood tinged nasal mucous, post nasal drip. She denies fever, chills or body aches. She has tried Copywriter, advertising with minimal relief of symptoms.   Review of Systems      Past Medical History:  Diagnosis Date  . Colitis   . Esophagitis   . H. pylori infection   . Sinusitis     Current Outpatient Medications  Medication Sig Dispense Refill  . Ferrous Gluconate (IRON 27 PO) iron    . FLUoxetine (PROZAC) 20 MG tablet Take 1 tablet (20 mg total) by mouth daily. 90 tablet 1  . fluticasone (FLONASE) 50 MCG/ACT nasal spray Place 2 sprays into both nostrils daily. 16 g 0  . ibuprofen (ADVIL) 600 MG tablet Take 1 tablet (600 mg total) by mouth every 6 (six) hours as needed. 30 tablet 0  . meclizine (ANTIVERT) 12.5 MG tablet Take 1-2 tablets (12.5-25 mg total) by mouth 3 (three) times daily as needed for dizziness. 30 tablet 0  . Multiple Vitamin (MULTIVITAMIN) tablet Take 1 tablet by mouth daily.     No current facility-administered  medications for this visit.    Allergies  Allergen Reactions  . Amoxicillin     REACTION: swelling. angioedema  . Penicillins     Family History  Problem Relation Age of Onset  . GER disease Mother   . Colon cancer Neg Hx   . Esophageal cancer Neg Hx   . Inflammatory bowel disease Neg Hx   . Liver disease Neg Hx   . Pancreatic cancer Neg Hx   . Rectal cancer Neg Hx   . Stomach cancer Neg Hx     Social History   Socioeconomic History  . Marital status: Married    Spouse name: Not on file  . Number of children: Not on file  . Years of education: Not on file  . Highest education level: Not on file  Occupational History  . Not on file  Tobacco Use  . Smoking status: Never Smoker  . Smokeless tobacco: Never Used  Substance and Sexual Activity  . Alcohol use: No  . Drug use: No  . Sexual activity: Not on file  Other Topics Concern  . Not on file  Social History Narrative  . Not on file   Social Determinants of Health   Financial Resource Strain:   . Difficulty of Paying Living Expenses: Not on file  Food Insecurity:   . Worried About Charity fundraiser in the Last Year: Not on file  . Ran Out of Food in the Last Year: Not  on file  Transportation Needs:   . Lack of Transportation (Medical): Not on file  . Lack of Transportation (Non-Medical): Not on file  Physical Activity:   . Days of Exercise per Week: Not on file  . Minutes of Exercise per Session: Not on file  Stress:   . Feeling of Stress : Not on file  Social Connections:   . Frequency of Communication with Friends and Family: Not on file  . Frequency of Social Gatherings with Friends and Family: Not on file  . Attends Religious Services: Not on file  . Active Member of Clubs or Organizations: Not on file  . Attends Archivist Meetings: Not on file  . Marital Status: Not on file  Intimate Partner Violence:   . Fear of Current or Ex-Partner: Not on file  . Emotionally Abused: Not on file    . Physically Abused: Not on file  . Sexually Abused: Not on file     Constitutional: Pt reports headache. Denies fever, malaise, fatigue,or abrupt weight changes.  HEENT: Pt reports ear fullness, nasal congestion, post nasal drip. Denies eye pain, eye redness, ear pain, ringing in the ears, wax buildup, runny nose,  bloody nose, or sore throat. Respiratory: Denies difficulty breathing, shortness of breath, cough or sputum production.    No other specific complaints in a complete review of systems (except as listed in HPI above).  Objective:   Physical Exam   Wt Readings from Last 3 Encounters:  05/21/20 187 lb (84.8 kg)  04/23/20 190 lb (86.2 kg)  01/08/20 187 lb 12.8 oz (85.2 kg)    General: Appears her stated age, well developed, well nourished in NAD. HEENT: Head: normal shape and size; Nose: congestion noted; Throat/Mouth: No hoarseness noted.  Pulmonary/Chest: Normal effort. No respiratory distress.  Neurological: Alert and oriented.  BMET    Component Value Date/Time   NA 142 05/21/2020 1123   K 4.0 05/21/2020 1123   CL 105 05/21/2020 1123   CO2 31 05/21/2020 1123   GLUCOSE 96 05/21/2020 1123   BUN 13 05/21/2020 1123   CREATININE 0.71 05/21/2020 1123   CALCIUM 9.5 05/21/2020 1123   GFRNONAA >60 12/25/2017 0025   GFRAA >60 12/25/2017 0025    Lipid Panel     Component Value Date/Time   CHOL 191 05/21/2020 1123   TRIG 112.0 05/21/2020 1123   HDL 44.50 05/21/2020 1123   CHOLHDL 4 05/21/2020 1123   VLDL 22.4 05/21/2020 1123   LDLCALC 124 (H) 05/21/2020 1123    CBC    Component Value Date/Time   WBC 7.0 05/21/2020 1123   RBC 4.93 05/21/2020 1123   HGB 13.9 05/21/2020 1123   HCT 41.7 05/21/2020 1123   PLT 281.0 05/21/2020 1123   MCV 84.7 05/21/2020 1123   MCH 29.7 12/25/2017 0025   MCHC 33.4 05/21/2020 1123   RDW 13.6 05/21/2020 1123   LYMPHSABS 3.0 06/13/2018 0934   MONOABS 0.3 06/13/2018 0934   EOSABS 0.1 06/13/2018 0934   BASOSABS 0.0 06/13/2018  0934    Hgb A1C Lab Results  Component Value Date   HGBA1C 6.1 05/21/2020           Assessment & Plan:   UC Follow Up for Dizziness, Viral Sinusitis:  UC notes reviewed RX for Pred Taper x 6 days RX for Doxycyline 100 mg BID x 10 days Continue Claritin and Flonase OTC  Return precautions discussed  Webb Silversmith, NP  I discussed the assessment and treatment plan with the  patient. The patient was provided an opportunity to ask questions and all were answered. The patient agreed with the plan and demonstrated an understanding of the instructions.   The patient was advised to call back or seek an in-person evaluation if the symptoms worsen or if the condition fails to improve as anticipated.

## 2020-07-21 NOTE — Patient Instructions (Signed)

## 2020-07-29 ENCOUNTER — Other Ambulatory Visit: Payer: Self-pay

## 2020-07-29 ENCOUNTER — Ambulatory Visit
Admission: RE | Admit: 2020-07-29 | Discharge: 2020-07-29 | Disposition: A | Discharge status: Home / Self Care | Payer: No Typology Code available for payment source | Source: Ambulatory Visit | Attending: Obstetrics & Gynecology | Admitting: Obstetrics & Gynecology

## 2020-07-29 DIAGNOSIS — R102 Pelvic and perineal pain: Secondary | ICD-10-CM | POA: Insufficient documentation

## 2020-09-03 ENCOUNTER — Other Ambulatory Visit (HOSPITAL_COMMUNITY): Payer: Self-pay | Admitting: Obstetrics & Gynecology

## 2020-09-03 MED FILL — PHENTERMINE 37.5 MG TABLET: 37.5 | 30 days supply | Qty: 30 | Fill #0

## 2020-10-22 ENCOUNTER — Other Ambulatory Visit (HOSPITAL_COMMUNITY): Payer: Self-pay | Admitting: Obstetrics & Gynecology

## 2021-02-12 ENCOUNTER — Other Ambulatory Visit (HOSPITAL_COMMUNITY): Payer: Self-pay

## 2021-02-12 DIAGNOSIS — L237 Allergic contact dermatitis due to plants, except food: Secondary | ICD-10-CM | POA: Insufficient documentation

## 2021-02-12 MED ORDER — PREDNISONE 20 MG PO TABS
ORAL_TABLET | ORAL | 0 refills | Status: AC
  Filled 2021-02-12: qty 15, 10d supply, fill #0

## 2021-04-05 ENCOUNTER — Other Ambulatory Visit: Payer: Self-pay

## 2021-04-05 ENCOUNTER — Ambulatory Visit (HOSPITAL_COMMUNITY)
Admission: EM | Admit: 2021-04-05 | Discharge: 2021-04-05 | Discharge status: Against Medical Advice | Payer: No Typology Code available for payment source

## 2021-04-21 ENCOUNTER — Other Ambulatory Visit: Payer: Self-pay

## 2021-04-21 ENCOUNTER — Encounter: Payer: Self-pay | Admitting: Internal Medicine

## 2021-04-21 ENCOUNTER — Ambulatory Visit (INDEPENDENT_AMBULATORY_CARE_PROVIDER_SITE_OTHER): Payer: No Typology Code available for payment source | Admitting: Internal Medicine

## 2021-04-21 ENCOUNTER — Ambulatory Visit (INDEPENDENT_AMBULATORY_CARE_PROVIDER_SITE_OTHER): Payer: No Typology Code available for payment source

## 2021-04-21 VITALS — BP 118/80 | HR 80 | Temp 98.4°F | Resp 18 | Ht 62.75 in | Wt 199.2 lb

## 2021-04-21 DIAGNOSIS — R5383 Other fatigue: Secondary | ICD-10-CM

## 2021-04-21 DIAGNOSIS — M79672 Pain in left foot: Secondary | ICD-10-CM | POA: Diagnosis not present

## 2021-04-21 DIAGNOSIS — R7303 Prediabetes: Secondary | ICD-10-CM

## 2021-04-21 DIAGNOSIS — R21 Rash and other nonspecific skin eruption: Secondary | ICD-10-CM

## 2021-04-21 LAB — COMPREHENSIVE METABOLIC PANEL
ALT: 27 U/L (ref 0–35)
AST: 22 U/L (ref 0–37)
Albumin: 4.3 g/dL (ref 3.5–5.2)
Alkaline Phosphatase: 67 U/L (ref 39–117)
BUN: 13 mg/dL (ref 6–23)
CO2: 30 mEq/L (ref 19–32)
Calcium: 9.8 mg/dL (ref 8.4–10.5)
Chloride: 103 mEq/L (ref 96–112)
Creatinine, Ser: 0.76 mg/dL (ref 0.40–1.20)
GFR: 94.41 mL/min (ref >60.00)
Glucose, Bld: 98 mg/dL (ref 70–99)
Potassium: 3.9 mEq/L (ref 3.5–5.1)
Sodium: 141 mEq/L (ref 135–145)
Total Bilirubin: 0.7 mg/dL (ref 0.2–1.2)
Total Protein: 7 g/dL (ref 6.0–8.3)

## 2021-04-21 LAB — CBC
HCT: 42.4 % (ref 36.0–46.0)
Hemoglobin: 14.3 g/dL (ref 12.0–15.0)
MCHC: 33.7 g/dL (ref 30.0–36.0)
MCV: 84.9 fl (ref 78.0–100.0)
Platelets: 327 10*3/uL (ref 150.0–400.0)
RBC: 4.99 Mil/uL (ref 3.87–5.11)
RDW: 14.5 % (ref 11.5–15.5)
WBC: 7.9 10*3/uL (ref 4.0–10.5)

## 2021-04-21 LAB — T4, FREE: Free T4: 0.66 ng/dL (ref 0.60–1.60)

## 2021-04-21 LAB — LIPID PANEL
Cholesterol: 176 mg/dL (ref 0–200)
HDL: 41.3 mg/dL (ref >39.00)
LDL Cholesterol: 109 mg/dL — ABNORMAL HIGH (ref 0–99)
NonHDL: 134.94
Total CHOL/HDL Ratio: 4
Triglycerides: 132 mg/dL (ref 0.0–149.0)
VLDL: 26.4 mg/dL (ref 0.0–40.0)

## 2021-04-21 LAB — VITAMIN D 25 HYDROXY (VIT D DEFICIENCY, FRACTURES): VITD: 31.29 ng/mL (ref 30.00–100.00)

## 2021-04-21 LAB — VITAMIN B12: Vitamin B-12: 456 pg/mL (ref 211–911)

## 2021-04-21 LAB — HEMOGLOBIN A1C: Hgb A1c MFr Bld: 6 % (ref 4.6–6.5)

## 2021-04-21 LAB — TSH: TSH: 1.27 u[IU]/mL (ref 0.35–5.50)

## 2021-04-21 NOTE — Patient Instructions (Signed)
We will check the labs today and you can use the steroid cream on the rash.  Give it another week or two to see if changing soap will help.  We are checking thyroid and the x-ray of the foot.

## 2021-04-21 NOTE — Progress Notes (Signed)
   Subjective:   Patient ID: Vanessa Byrd, female    DOB: 08-Oct-1975, 45 y.o.   MRN: [id number]  HPI The patient is a 45 YO female coming in for transfer of care and rash.  PMH, [facility], social history reviewed and updated  Review of Systems  Constitutional:  Positive for fatigue.  HENT: Negative.    Eyes: Negative.   Respiratory:  Negative for cough, chest tightness and shortness of breath.   Cardiovascular:  Negative for chest pain, palpitations and leg swelling.  Gastrointestinal:  Negative for abdominal distention, abdominal pain, constipation, diarrhea, nausea and vomiting.  Musculoskeletal: Negative.   Skin:  Positive for rash.  Neurological: Negative.   Psychiatric/Behavioral: Negative.     Objective:  Physical Exam Constitutional:      Appearance: She is well-developed.  HENT:     Head: Normocephalic and atraumatic.  Cardiovascular:     Rate and Rhythm: Normal rate and regular rhythm.  Pulmonary:     Effort: Pulmonary effort is normal. No respiratory distress.     Breath sounds: Normal breath sounds. No wheezing or rales.  Abdominal:     General: Bowel sounds are normal. There is no distension.     Palpations: Abdomen is soft.     Tenderness: There is no abdominal tenderness. There is no rebound.  Musculoskeletal:     Cervical back: Normal range of motion.  Skin:    General: Skin is warm and dry.     Comments: Some macular red lesions <32m circular  Neurological:     Mental Status: She is alert and oriented to person, place, and time.     Coordination: Coordination normal.    Vitals:   04/21/21 1104  BP: 118/80  Pulse: 80  Resp: 18  Temp: 98.4 F (36.9 C)  TempSrc: Oral  SpO2: 98%  Weight: 199 lb 3.2 oz (90.4 kg)  Height: 5' 2.75" (1.594 m)    This visit occurred during the SARS-CoV-2 public health emergency.  Safety protocols were in place, including screening questions prior to the visit, additional usage of staff PPE, and extensive  cleaning of exam room while observing appropriate contact time as indicated for disinfecting solutions.   Assessment & Plan:

## 2021-04-23 ENCOUNTER — Encounter: Payer: Self-pay | Admitting: Internal Medicine

## 2021-04-23 DIAGNOSIS — R5383 Other fatigue: Secondary | ICD-10-CM | POA: Insufficient documentation

## 2021-04-23 DIAGNOSIS — E038 Other specified hypothyroidism: Secondary | ICD-10-CM

## 2021-04-23 DIAGNOSIS — M79672 Pain in left foot: Secondary | ICD-10-CM | POA: Insufficient documentation

## 2021-04-23 DIAGNOSIS — R7303 Prediabetes: Secondary | ICD-10-CM | POA: Insufficient documentation

## 2021-04-23 DIAGNOSIS — R21 Rash and other nonspecific skin eruption: Secondary | ICD-10-CM | POA: Insufficient documentation

## 2021-04-23 NOTE — Assessment & Plan Note (Signed)
Needs follow up HgA1c ordered today.

## 2021-04-23 NOTE — Assessment & Plan Note (Signed)
Concern for fracture given persistent pain after a month with some prominence on the side of the foot. Ordered x-ray left foot.

## 2021-04-23 NOTE — Assessment & Plan Note (Signed)
Can use prior steroid cream she has leftover. She has recently changed body soaps and advised to give this 1-2 weeks.

## 2021-04-23 NOTE — Assessment & Plan Note (Signed)
Checking vitamin D, B12, TSH, free T4, HgA1c. Adjust as needed.

## 2021-04-26 ENCOUNTER — Other Ambulatory Visit (HOSPITAL_COMMUNITY): Payer: Self-pay

## 2021-04-26 MED ORDER — LEVOTHYROXINE SODIUM 50 MCG PO TABS
50.0000 ug | ORAL_TABLET | Freq: Every day | ORAL | 1 refills | Status: DC
  Filled 2021-04-26: qty 90, 90d supply, fill #0
  Filled 2021-11-24: qty 90, 90d supply, fill #1

## 2021-07-28 ENCOUNTER — Encounter: Payer: Self-pay | Admitting: Internal Medicine

## 2021-08-30 ENCOUNTER — Encounter: Payer: Self-pay | Admitting: Internal Medicine

## 2021-10-20 ENCOUNTER — Encounter: Payer: Self-pay | Admitting: Internal Medicine

## 2021-11-03 ENCOUNTER — Other Ambulatory Visit (HOSPITAL_COMMUNITY): Payer: Self-pay

## 2021-11-03 ENCOUNTER — Ambulatory Visit (INDEPENDENT_AMBULATORY_CARE_PROVIDER_SITE_OTHER): Payer: No Typology Code available for payment source | Admitting: Internal Medicine

## 2021-11-03 ENCOUNTER — Other Ambulatory Visit: Payer: Self-pay

## 2021-11-03 ENCOUNTER — Encounter: Payer: Self-pay | Admitting: Internal Medicine

## 2021-11-03 VITALS — BP 122/82 | HR 70 | Resp 18 | Ht 62.75 in | Wt 202.6 lb

## 2021-11-03 DIAGNOSIS — D219 Benign neoplasm of connective and other soft tissue, unspecified: Secondary | ICD-10-CM

## 2021-11-03 DIAGNOSIS — E038 Other specified hypothyroidism: Secondary | ICD-10-CM

## 2021-11-03 DIAGNOSIS — R102 Pelvic and perineal pain: Secondary | ICD-10-CM

## 2021-11-03 DIAGNOSIS — F32A Depression, unspecified: Secondary | ICD-10-CM

## 2021-11-03 DIAGNOSIS — F419 Anxiety disorder, unspecified: Secondary | ICD-10-CM

## 2021-11-03 DIAGNOSIS — D234 Other benign neoplasm of skin of scalp and neck: Secondary | ICD-10-CM

## 2021-11-03 LAB — T4, FREE: Free T4: 0.79 ng/dL (ref 0.60–1.60)

## 2021-11-03 LAB — TSH: TSH: 0.76 u[IU]/mL (ref 0.35–5.50)

## 2021-11-03 MED ORDER — FLUOXETINE HCL 20 MG PO CAPS
20.0000 mg | ORAL_CAPSULE | Freq: Every day | ORAL | 3 refills | Status: DC
  Filled 2021-11-03: qty 30, 30d supply, fill #0
  Filled 2021-11-04: qty 90, 90d supply, fill #0
  Filled 2022-09-29: qty 90, 90d supply, fill #1

## 2021-11-03 NOTE — Patient Instructions (Signed)
We will get you in with the dermatologist and the ob/gyn. ? ?We will check the levels. ? ? ?

## 2021-11-03 NOTE — Progress Notes (Signed)
? ?  Subjective:  ? ?Patient ID: Vanessa Byrd, female    DOB: Jun 20, 1976, 46 y.o.   MRN: [id number] ? ?HPI ?The patient is a 46 YO female coming in for concerns. ? ?Review of Systems  ?Constitutional:  Positive for unexpected weight change.  ?HENT: Negative.    ?     Cysts on scalp  ?Eyes: Negative.   ?Respiratory:  Negative for cough, chest tightness and shortness of breath.   ?Cardiovascular:  Negative for chest pain, palpitations and leg swelling.  ?Gastrointestinal:  Negative for abdominal distention, abdominal pain, constipation, diarrhea, nausea and vomiting.  ?Genitourinary:  Positive for pelvic pain.  ?Musculoskeletal: Negative.   ?Skin: Negative.   ?Neurological: Negative.   ?Psychiatric/Behavioral: Negative.    ? ?Objective:  ?Physical Exam ?Constitutional:   ?   Appearance: She is well-developed.  ?HENT:  ?   Head: Normocephalic and atraumatic.  ?   Comments: 2 cysts on scalp ?Cardiovascular:  ?   Rate and Rhythm: Normal rate and regular rhythm.  ?Pulmonary:  ?   Effort: Pulmonary effort is normal. No respiratory distress.  ?   Breath sounds: Normal breath sounds. No wheezing or rales.  ?Abdominal:  ?   General: Bowel sounds are normal. There is no distension.  ?   Palpations: Abdomen is soft.  ?   Tenderness: There is no abdominal tenderness. There is no rebound.  ?Musculoskeletal:  ?   Cervical back: Normal range of motion.  ?Skin: ?   General: Skin is warm and dry.  ?Neurological:  ?   Mental Status: She is alert and oriented to person, place, and time.  ?   Coordination: Coordination normal.  ? ? ?Vitals:  ? 11/03/21 1001  ?BP: 122/82  ?Pulse: 70  ?Resp: 18  ?SpO2: 93%  ?Weight: 202 lb 9.6 oz (91.9 kg)  ?Height: 5' 2.75" (1.594 m)  ? ? ?This visit occurred during the SARS-CoV-2 public health emergency.  Safety protocols were in place, including screening questions prior to the visit, additional usage of staff PPE, and extensive cleaning of exam room while observing appropriate contact time  as indicated for disinfecting solutions.  ? ?Assessment & Plan:  ? ?

## 2021-11-04 ENCOUNTER — Other Ambulatory Visit (HOSPITAL_COMMUNITY): Payer: Self-pay

## 2021-11-05 DIAGNOSIS — E038 Other specified hypothyroidism: Secondary | ICD-10-CM | POA: Insufficient documentation

## 2021-11-05 DIAGNOSIS — D219 Benign neoplasm of connective and other soft tissue, unspecified: Secondary | ICD-10-CM | POA: Insufficient documentation

## 2021-11-05 DIAGNOSIS — D234 Other benign neoplasm of skin of scalp and neck: Secondary | ICD-10-CM | POA: Insufficient documentation

## 2021-11-05 NOTE — Assessment & Plan Note (Signed)
Has started synthroid 50 mcg daily and not taking consistently. No recheck on levels. Checking TSH and free T4 and adjust as needed. She is struggling with weight loss so may need higher dose if not at goal.  ?

## 2021-11-05 NOTE — Assessment & Plan Note (Signed)
BMI 36 and complicated by pre-diabetes and GERD. Working on weight loss. Having low thyroid levels which we are treating and may need dose adjustment as she has lost about 4 pounds since fall but feels unable to lose further.  ?

## 2021-11-05 NOTE — Assessment & Plan Note (Signed)
Referral to ob/gyn for consideration of treatment, she is having some pelvic pain associated with this.  ?

## 2021-11-05 NOTE — Assessment & Plan Note (Signed)
Would like to resume prozac 20 mg daily so refilled. She is in school and this is causing more stress/anxiety.  ?

## 2021-11-05 NOTE — Assessment & Plan Note (Signed)
Referral to dermatology for removal as these 2 cysts are growing.  ?

## 2021-11-09 ENCOUNTER — Encounter: Payer: Self-pay | Admitting: Internal Medicine

## 2021-11-24 ENCOUNTER — Other Ambulatory Visit (HOSPITAL_COMMUNITY): Payer: Self-pay

## 2021-12-22 ENCOUNTER — Ambulatory Visit: Payer: No Typology Code available for payment source | Admitting: Obstetrics and Gynecology

## 2022-01-10 ENCOUNTER — Ambulatory Visit (HOSPITAL_COMMUNITY)
Admission: EM | Admit: 2022-01-10 | Discharge: 2022-01-10 | Disposition: A | Discharge status: Home / Self Care | Payer: No Typology Code available for payment source | Attending: Student | Admitting: Student

## 2022-01-10 DIAGNOSIS — R0789 Other chest pain: Secondary | ICD-10-CM | POA: Diagnosis not present

## 2022-01-10 DIAGNOSIS — K2100 Gastro-esophageal reflux disease with esophagitis, without bleeding: Secondary | ICD-10-CM | POA: Diagnosis not present

## 2022-01-10 MED ORDER — LANSOPRAZOLE 15 MG PO CPDR
15.0000 mg | DELAYED_RELEASE_CAPSULE | Freq: Every day | ORAL | 1 refills | Status: DC
  Filled 2022-01-10: qty 30, 30d supply, fill #0

## 2022-01-10 MED ORDER — LIDOCAINE VISCOUS HCL 2 % MT SOLN
15.0000 mL | Freq: Once | OROMUCOSAL | Status: AC
  Administered 2022-01-10: 15 mL via ORAL

## 2022-01-10 MED ORDER — ALUM & MAG HYDROXIDE-SIMETH 200-200-20 MG/5ML PO SUSP
30.0000 mL | Freq: Once | ORAL | Status: AC
  Administered 2022-01-10: 30 mL via ORAL

## 2022-01-10 MED ORDER — ALUM & MAG HYDROXIDE-SIMETH 200-200-20 MG/5ML PO SUSP
ORAL | Status: AC
  Filled 2022-01-10: qty 30

## 2022-01-10 MED ORDER — LIDOCAINE VISCOUS HCL 2 % MT SOLN
OROMUCOSAL | Status: AC
  Filled 2022-01-10: qty 15

## 2022-01-10 NOTE — ED Provider Notes (Signed)
?[place] ? ? ? ?CSN: [id number] ?Arrival date & time: 01/10/22  1728 ? ? ?  ? ?History   ?Chief Complaint ?Chief Complaint  ?Patient presents with  ? Chest Pain  ? Gastroesophageal Reflux  ? ? ?HPI ?Vanessa Byrd is a 46 y.o. female presenting with 2 episodes of epigastric pain and chest tightness/burning in the last 4 days.  History GERD, H. pylori.  Patient states that 4 days ago, about 2 hours following eating she developed episode of epigastric burning that radiated up her esophagus.  This was associated with some chest tightness, but no left-sided chest pain, left arm pain, left jaw pain, dizziness, shortness of breath, nausea, diarrhea.  She states another episode occurred similarly today.  Both episodes terminated on their own, she did attempt Pepto-Bismol 1 time with minimal relief.  She also notes that she is having some chest wall pain related to new activities and exercises at the gym. Last BM was few hours ago and was normal. Denies urinary symptoms. Denies excessive NSAIDs, alcohol, spicy food, caffeine.  ? ?HPI ? ?Past Medical History:  ?Diagnosis Date  ? Colitis   ? Esophagitis   ? H. pylori infection   ? Sinusitis   ? ? ?Patient Active Problem List  ? Diagnosis Date Noted  ? Other specified hypothyroidism 11/05/2021  ? Cyst, dermoid, scalp and neck 11/05/2021  ? Fibroid 11/05/2021  ? Morbid obesity ([place]) 11/05/2021  ? Left foot pain 04/23/2021  ? Other fatigue 04/23/2021  ? Rash 04/23/2021  ? Pre-diabetes 04/23/2021  ? Chronic foot pain, right 05/21/2020  ? Iron deficiency 07/19/2018  ? Gastroesophageal reflux disease with esophagitis 06/13/2018  ? Anxiety and depression 05/31/2013  ? ALLERGIC RHINITIS 11/24/2009  ? ? ?Past Surgical History:  ?Procedure Laterality Date  ? TONSILLECTOMY    ? WISDOM TOOTH EXTRACTION    ? ? ?OB History   ?No obstetric history on file. ?  ? ? ? ?Home Medications   ? ?Prior to Admission medications   ?Medication Sig Start Date End Date Taking?  Authorizing Provider  ?lansoprazole (PREVACID) 15 MG capsule Take 1 capsule (15 mg total) by mouth daily at 12 noon. 01/10/22  Yes Hazel Sams, PA-C  ?Ferrous Gluconate (IRON 27 PO) iron    [provider]  ?FLUoxetine (PROZAC) 20 MG capsule Take 1 capsule (20 mg total) by mouth daily. 11/03/21   Hoyt Koch, MD  ?fluticasone Asencion Islam) 50 MCG/ACT nasal spray Place 2 sprays into both nostrils daily. 09/17/17   Tasia Catchings, Amy V, PA-C  ?levothyroxine (SYNTHROID) 50 MCG tablet Take 1 tablet (50 mcg total) by mouth daily. 04/26/21   Hoyt Koch, MD  ?meclizine (ANTIVERT) 12.5 MG tablet Take 1-2 tablets (12.5-25 mg total) by mouth 3 (three) times daily as needed for dizziness. 07/07/20   Raylene Everts, MD  ?Multiple Vitamin (MULTIVITAMIN) tablet Take 1 tablet by mouth daily.    [provider]  ?omeprazole (PRILOSEC) 40 MG capsule Take 1 capsule (40 mg total) by mouth 2 (two) times daily. 06/13/18 07/07/20  Mansouraty, Telford Nab., MD  ? ? ?Family History ?Family History  ?Problem Relation Age of Onset  ? GER disease Mother   ? Colon cancer Neg Hx   ? Esophageal cancer Neg Hx   ? Inflammatory bowel disease Neg Hx   ? Liver disease Neg Hx   ? Pancreatic cancer Neg Hx   ? Rectal cancer Neg Hx   ? Stomach cancer Neg Hx   ? ? ?  Social History ?Social History  ? ?Tobacco Use  ? Smoking status: Never  ? Smokeless tobacco: Never  ?Substance Use Topics  ? Alcohol use: No  ? Drug use: No  ? ? ? ?Allergies   ?Amoxicillin and Penicillins ? ? ?Review of Systems ?Review of Systems  ?Constitutional:  Negative for appetite change, chills, diaphoresis, fever and unexpected weight change.  ?HENT:  Negative for congestion, ear pain, sinus pressure, sinus pain, sneezing, sore throat and trouble swallowing.   ?Respiratory:  Negative for cough, chest tightness and shortness of breath.   ?Cardiovascular:  Negative for chest pain.  ?Gastrointestinal:  Positive for abdominal pain. Negative for abdominal  distention, anal bleeding, blood in stool, constipation, diarrhea, nausea, rectal pain and vomiting.  ?Genitourinary:  Negative for dysuria, flank pain, frequency and urgency.  ?Musculoskeletal:  Negative for back pain and myalgias.  ?Neurological:  Negative for dizziness, light-headedness and headaches.  ?All other systems reviewed and are negative. ? ? ?Physical Exam ?Triage Vital Signs ?ED Triage Vitals  ?Enc Vitals Group  ?   BP 01/10/22 1835 (!) 141/90  ?   Pulse Rate 01/10/22 1835 78  ?   Resp 01/10/22 1835 18  ?   Temp 01/10/22 1835 98.5 ?F (36.9 ?C)  ?   Temp Source 01/10/22 1835 Oral  ?   SpO2 01/10/22 1835 98 %  ?   Weight --   ?   Height --   ?   Head Circumference --   ?   Peak Flow --   ?   Pain Score 01/10/22 1825 1  ?   Pain Loc --   ?   Pain Edu? --   ?   Excl. in [place]? --   ? ?No data found. ? ?Updated Vital Signs ?BP (!) 141/90 (BP Location: Left Arm)   Pulse 78   Temp 98.5 ?F (36.9 ?C) (Oral)   Resp 18   LMP 08/20/2018   SpO2 98%  ? ?Visual Acuity ?Right Eye Distance:   ?Left Eye Distance:   ?Bilateral Distance:   ? ?Right Eye Near:   ?Left Eye Near:    ?Bilateral Near:    ? ?Physical Exam ?Vitals reviewed.  ?Constitutional:   ?   General: She is not in acute distress. ?   Appearance: Normal appearance. She is not ill-appearing.  ?HENT:  ?   Head: Normocephalic and atraumatic.  ?   Mouth/Throat:  ?   Mouth: Mucous membranes are moist.  ?   Comments: Moist mucous membranes ?Eyes:  ?   Extraocular Movements: Extraocular movements intact.  ?   Pupils: Pupils are equal, round, and reactive to light.  ?Cardiovascular:  ?   Rate and Rhythm: Normal rate and regular rhythm.  ?   Heart sounds: Normal heart sounds.  ?Pulmonary:  ?   Effort: Pulmonary effort is normal.  ?   Breath sounds: Normal breath sounds. No wheezing, rhonchi or rales.  ?Chest:  ?   Chest wall: Tenderness present.  ?Abdominal:  ?   General: Bowel sounds are normal. There is no distension.  ?   Palpations: Abdomen is soft. There is no  mass.  ?   Tenderness: There is abdominal tenderness in the epigastric area. There is no right CVA tenderness, left CVA tenderness, guarding or rebound. Negative signs include Murphy's sign, Rovsing's sign and McBurney's sign.  ?   Comments: Minimal epigastric pain to palpation, with no guarding or rebound.   ?Skin: ?   General: Skin  is warm.  ?   Capillary Refill: Capillary refill takes less than 2 seconds.  ?   Comments: Good skin turgor  ?Neurological:  ?   General: No focal deficit present.  ?   Mental Status: She is alert and oriented to person, place, and time.  ?Psychiatric:     ?   Mood and Affect: Mood normal.     ?   Behavior: Behavior normal.  ? ? ? ?UC Treatments / Results  ?Labs ?(all labs ordered are listed, but only abnormal results are displayed) ?Labs Reviewed - No data to display ? ?EKG ? ? ?Radiology ?No results found. ? ?Procedures ?Procedures (including critical care time) ? ?Medications Ordered in UC ?Medications  ?alum & mag hydroxide-simeth (MAALOX/MYLANTA) 200-200-20 MG/5ML suspension 30 mL (30 mLs Oral Given 01/10/22 1851)  ?  And  ?lidocaine (XYLOCAINE) 2 % viscous mouth solution 15 mL (15 mLs Oral Given 01/10/22 1851)  ? ? ?Initial Impression / Assessment and Plan / UC Course  ?I have reviewed the triage vital signs and the nursing notes. ? ?Pertinent labs & imaging results that were available during my care of the patient were reviewed by me and considered in my medical decision making (see chart for details). ? ?  ? ?This patient is a very pleasant 46 y.o. year old female presenting with GERD. Symptoms are already improved at time of visit, but completely resolved following GI cocktail. There was minimal-no abdominal pain on exam. Afebrile, nontachycardic.  ? ?Chest wall pain is reproducible. EKG today NSR, unchanged from 04/2018 EKG.  ? ?Discharged home with lansoprazole x30 days. Discussed foods to avoid. ? ?ED return precautions discussed. Patient verbalizes understanding and agreement.   ? ?Coding Level 4 for review of past notes/labs, order and interpretation of labs today, and prescription drug management ? ?Final Clinical Impressions(s) / UC Diagnoses  ? ?Final diagnoses:  ?Gastroesophageal refl

## 2022-01-10 NOTE — Discharge Instructions (Addendum)
-  Starting tomorrow, lansoprazole once daily for about 30 days. ?-If symptoms persist, you can also take Tums or similar for acute acid reflux. ?-Follow-up with primary care if symptoms persist, or head to the emergency department if they worsen. ?

## 2022-01-10 NOTE — ED Triage Notes (Signed)
C/o burning chest tightness x 2 days. Pt thinks she may be having heart burn.  ?

## 2022-01-11 ENCOUNTER — Other Ambulatory Visit (HOSPITAL_COMMUNITY): Payer: Self-pay

## 2022-02-04 ENCOUNTER — Encounter: Payer: Self-pay | Admitting: Obstetrics and Gynecology

## 2022-02-04 ENCOUNTER — Ambulatory Visit (INDEPENDENT_AMBULATORY_CARE_PROVIDER_SITE_OTHER): Payer: No Typology Code available for payment source | Admitting: Obstetrics and Gynecology

## 2022-02-04 VITALS — BP 130/85 | HR 73 | Ht 63.0 in | Wt 196.3 lb

## 2022-02-04 DIAGNOSIS — R102 Pelvic and perineal pain: Secondary | ICD-10-CM | POA: Diagnosis not present

## 2022-02-04 DIAGNOSIS — Z1231 Encounter for screening mammogram for malignant neoplasm of breast: Secondary | ICD-10-CM | POA: Diagnosis not present

## 2022-02-04 DIAGNOSIS — Z0001 Encounter for general adult medical examination with abnormal findings: Secondary | ICD-10-CM | POA: Insufficient documentation

## 2022-02-04 DIAGNOSIS — D219 Benign neoplasm of connective and other soft tissue, unspecified: Secondary | ICD-10-CM | POA: Diagnosis not present

## 2022-02-04 NOTE — Patient Instructions (Signed)

## 2022-02-04 NOTE — Progress Notes (Signed)
New gyn visit presents for annual reports menstrual cramping and requests to follow up on fibroid.  Normal pap June or July 2023 at [facility] per pt  PHQ9=0 GAD7=3

## 2022-02-04 NOTE — Progress Notes (Signed)
Vanessa Byrd presents with c/o right sided pelvic pain for the last 8 months. Pain is cyclic in nature most of the time but occasional it is random. Last 2-3 days. Takes OTC NSAID's sometimes and this reliefs the pain. No cycle for the last 2 yrs. GYN U/S 12/21, small 1.4 cm fibroid Some vaginal dryness  Sexual active without problems  [id number] SAB x 1 TSVD ( 7# 10 oz)  H/O hypothyroidism, followed by PCP  PE AF VSS Lungs clear  Heart RRR Abd soft + BS GU Nl EGBUS, cervix no lesions, uterus small, mobile, non tender, no masses  A/P Pelvic pain        Uterine fibroid  I do not suspect that Vanessa Byrd's pain is related to her uterine fibroid as it appears to be localized to the right side ( ovary). I suspect it is related to her ovary. Will check GYN U/S. Reviewed with pt. Screening mammogram ordered

## 2022-02-07 ENCOUNTER — Ambulatory Visit (HOSPITAL_BASED_OUTPATIENT_CLINIC_OR_DEPARTMENT_OTHER)
Admission: RE | Admit: 2022-02-07 | Discharge: 2022-02-07 | Disposition: A | Discharge status: Home / Self Care | Payer: No Typology Code available for payment source | Source: Ambulatory Visit | Attending: Obstetrics and Gynecology | Admitting: Obstetrics and Gynecology

## 2022-02-07 DIAGNOSIS — D219 Benign neoplasm of connective and other soft tissue, unspecified: Secondary | ICD-10-CM | POA: Diagnosis present

## 2022-02-07 DIAGNOSIS — R102 Pelvic and perineal pain: Secondary | ICD-10-CM | POA: Insufficient documentation

## 2022-02-15 ENCOUNTER — Encounter: Payer: Self-pay | Admitting: Internal Medicine

## 2022-02-16 ENCOUNTER — Other Ambulatory Visit (HOSPITAL_COMMUNITY): Payer: Self-pay

## 2022-02-16 ENCOUNTER — Other Ambulatory Visit: Payer: Self-pay | Admitting: Internal Medicine

## 2022-02-16 MED ORDER — LEVOTHYROXINE SODIUM 50 MCG PO TABS
50.0000 ug | ORAL_TABLET | Freq: Every day | ORAL | 0 refills | Status: DC
  Filled 2022-02-16: qty 90, 90d supply, fill #0

## 2022-02-18 ENCOUNTER — Inpatient Hospital Stay (HOSPITAL_BASED_OUTPATIENT_CLINIC_OR_DEPARTMENT_OTHER)
Admission: RE | Admit: 2022-02-18 | Payer: No Typology Code available for payment source | Source: Ambulatory Visit | Admitting: Radiology

## 2022-02-18 ENCOUNTER — Telehealth: Payer: No Typology Code available for payment source | Admitting: Physician Assistant

## 2022-02-18 ENCOUNTER — Other Ambulatory Visit (HOSPITAL_COMMUNITY): Payer: Self-pay

## 2022-02-18 DIAGNOSIS — B9789 Other viral agents as the cause of diseases classified elsewhere: Secondary | ICD-10-CM

## 2022-02-18 DIAGNOSIS — J019 Acute sinusitis, unspecified: Secondary | ICD-10-CM | POA: Diagnosis not present

## 2022-02-18 MED ORDER — IPRATROPIUM BROMIDE 0.03 % NA SOLN
2.0000 | Freq: Two times a day (BID) | NASAL | 0 refills | Status: DC
  Filled 2022-02-18: qty 30, 43d supply, fill #0

## 2022-02-19 ENCOUNTER — Ambulatory Visit (HOSPITAL_BASED_OUTPATIENT_CLINIC_OR_DEPARTMENT_OTHER): Payer: No Typology Code available for payment source | Admitting: Radiology

## 2022-02-28 ENCOUNTER — Encounter: Payer: Self-pay | Admitting: Internal Medicine

## 2022-02-28 ENCOUNTER — Other Ambulatory Visit (HOSPITAL_COMMUNITY): Payer: Self-pay

## 2022-02-28 ENCOUNTER — Ambulatory Visit (INDEPENDENT_AMBULATORY_CARE_PROVIDER_SITE_OTHER): Payer: No Typology Code available for payment source | Admitting: Internal Medicine

## 2022-02-28 VITALS — BP 120/80 | HR 53 | Resp 18 | Ht 63.0 in | Wt 202.2 lb

## 2022-02-28 DIAGNOSIS — M7061 Trochanteric bursitis, right hip: Secondary | ICD-10-CM | POA: Diagnosis not present

## 2022-02-28 MED ORDER — SEMAGLUTIDE-WEIGHT MANAGEMENT 0.5 MG/0.5ML ~~LOC~~ SOAJ
0.5000 mg | SUBCUTANEOUS | 0 refills | Status: AC
  Filled 2022-02-28: qty 2, 28d supply, fill #0

## 2022-02-28 MED ORDER — MELOXICAM 15 MG PO TABS
15.0000 mg | ORAL_TABLET | Freq: Every day | ORAL | 0 refills | Status: DC
  Filled 2022-02-28: qty 30, 30d supply, fill #0

## 2022-02-28 MED ORDER — SEMAGLUTIDE-WEIGHT MANAGEMENT 0.25 MG/0.5ML ~~LOC~~ SOAJ
0.2500 mg | SUBCUTANEOUS | 0 refills | Status: AC
  Filled 2022-02-28: qty 2, 28d supply, fill #0

## 2022-02-28 MED ORDER — SEMAGLUTIDE-WEIGHT MANAGEMENT 1.7 MG/0.75ML ~~LOC~~ SOAJ
1.7000 mg | SUBCUTANEOUS | 0 refills | Status: AC
  Filled 2022-02-28: qty 3, 28d supply, fill #0

## 2022-02-28 MED ORDER — SEMAGLUTIDE-WEIGHT MANAGEMENT 1 MG/0.5ML ~~LOC~~ SOAJ
1.0000 mg | SUBCUTANEOUS | 0 refills | Status: AC
  Filled 2022-02-28: qty 2, 28d supply, fill #0

## 2022-02-28 MED ORDER — SEMAGLUTIDE-WEIGHT MANAGEMENT 2.4 MG/0.75ML ~~LOC~~ SOAJ
2.4000 mg | SUBCUTANEOUS | 0 refills | Status: AC
  Filled 2022-02-28: qty 3, 28d supply, fill #0

## 2022-02-28 NOTE — Progress Notes (Signed)
   Subjective:   Patient ID: Vanessa Byrd, female    DOB: 01-Mar-1976, 46 y.o.   MRN: [id number]  HPI The patient is a 46 YO female coming in for right thigh pain, worse with laying on it.   Review of Systems  Constitutional: Negative.   HENT: Negative.    Eyes: Negative.   Respiratory:  Negative for cough, chest tightness and shortness of breath.   Cardiovascular:  Negative for chest pain, palpitations and leg swelling.  Gastrointestinal:  Negative for abdominal distention, abdominal pain, constipation, diarrhea, nausea and vomiting.  Musculoskeletal:  Positive for arthralgias and myalgias.  Skin: Negative.   Neurological: Negative.   Psychiatric/Behavioral: Negative.      Objective:  Physical Exam Constitutional:      Appearance: She is well-developed.  HENT:     Head: Normocephalic and atraumatic.  Cardiovascular:     Rate and Rhythm: Normal rate and regular rhythm.  Pulmonary:     Effort: Pulmonary effort is normal. No respiratory distress.     Breath sounds: Normal breath sounds. No wheezing or rales.  Abdominal:     General: Bowel sounds are normal. There is no distension.     Palpations: Abdomen is soft.     Tenderness: There is no abdominal tenderness. There is no rebound.  Musculoskeletal:        General: Tenderness present.     Cervical back: Normal range of motion.  Skin:    General: Skin is warm and dry.  Neurological:     Mental Status: She is alert and oriented to person, place, and time.     Coordination: Coordination normal.     Vitals:   02/28/22 0912  BP: 120/80  Pulse: (!) 53  Resp: 18  SpO2: 98%  Weight: 202 lb 3.2 oz (91.7 kg)  Height: '5\' 3"'$  (1.6 m)    Assessment & Plan:

## 2022-02-28 NOTE — Patient Instructions (Addendum)
We have given you the stretches to help the hip.  We have sent in meloxicam to take 1 pill daily for the next 2 weeks.  We have sent in wegovy to help with the weight loss.

## 2022-03-02 ENCOUNTER — Other Ambulatory Visit (HOSPITAL_COMMUNITY): Payer: Self-pay

## 2022-03-03 DIAGNOSIS — M7061 Trochanteric bursitis, right hip: Secondary | ICD-10-CM | POA: Insufficient documentation

## 2022-03-03 NOTE — Assessment & Plan Note (Signed)
Rx meloxicam for 1-2 weeks and if no improvement refer to sports medicine for infection.

## 2022-03-03 NOTE — Assessment & Plan Note (Signed)
Rx wegovy after counseling about benefit and risk to help with weight loss if covered.

## 2022-03-12 ENCOUNTER — Ambulatory Visit (HOSPITAL_BASED_OUTPATIENT_CLINIC_OR_DEPARTMENT_OTHER)
Admission: RE | Admit: 2022-03-12 | Discharge: 2022-03-12 | Disposition: A | Discharge status: Home / Self Care | Payer: No Typology Code available for payment source | Source: Ambulatory Visit | Attending: Obstetrics and Gynecology | Admitting: Obstetrics and Gynecology

## 2022-03-12 DIAGNOSIS — Z1231 Encounter for screening mammogram for malignant neoplasm of breast: Secondary | ICD-10-CM | POA: Diagnosis present

## 2022-03-24 ENCOUNTER — Ambulatory Visit (INDEPENDENT_AMBULATORY_CARE_PROVIDER_SITE_OTHER): Payer: No Typology Code available for payment source | Admitting: Obstetrics and Gynecology

## 2022-03-24 ENCOUNTER — Encounter: Payer: Self-pay | Admitting: Obstetrics and Gynecology

## 2022-03-24 VITALS — BP 129/81 | HR 76 | Ht 63.0 in | Wt 201.6 lb

## 2022-03-24 DIAGNOSIS — R102 Pelvic and perineal pain: Secondary | ICD-10-CM | POA: Diagnosis not present

## 2022-03-24 NOTE — Progress Notes (Signed)
Pt presents for pelvic US f/u.

## 2022-03-24 NOTE — Addendum Note (Signed)
Addended by: Chancy Milroy on: 03/24/2022 09:27 AM   Modules accepted: Orders

## 2022-03-24 NOTE — Progress Notes (Signed)
Patient ID: Vanessa Byrd, female   DOB: [birth year], 46 y.o.   MRN: [id number] [name] presents for f/u of her GYN U/S for pevlic pain. GYN U/S normal except for small fibroid. Results reviewed with pt Still has pain on occ, does not interfere with her ADL's  PE AF VSS Lungs clear Heart RRR Abd soft + BS  A/P Pelvic pain  No GYN cause of pt's identified. F/U PRN

## 2022-03-27 ENCOUNTER — Encounter: Payer: Self-pay | Admitting: Internal Medicine

## 2022-04-15 ENCOUNTER — Other Ambulatory Visit (HOSPITAL_COMMUNITY): Payer: Self-pay

## 2022-04-15 ENCOUNTER — Encounter: Payer: Self-pay | Admitting: Internal Medicine

## 2022-04-15 ENCOUNTER — Ambulatory Visit (INDEPENDENT_AMBULATORY_CARE_PROVIDER_SITE_OTHER): Payer: No Typology Code available for payment source | Admitting: Internal Medicine

## 2022-04-15 ENCOUNTER — Ambulatory Visit
Admission: RE | Admit: 2022-04-15 | Discharge: 2022-04-15 | Disposition: A | Discharge status: Home / Self Care | Payer: No Typology Code available for payment source | Source: Ambulatory Visit | Attending: Internal Medicine | Admitting: Internal Medicine

## 2022-04-15 VITALS — BP 130/80 | HR 78 | Temp 98.0°F | Ht 63.0 in | Wt 193.0 lb

## 2022-04-15 DIAGNOSIS — R1031 Right lower quadrant pain: Secondary | ICD-10-CM

## 2022-04-15 DIAGNOSIS — E038 Other specified hypothyroidism: Secondary | ICD-10-CM | POA: Diagnosis not present

## 2022-04-15 DIAGNOSIS — Z0001 Encounter for general adult medical examination with abnormal findings: Secondary | ICD-10-CM

## 2022-04-15 LAB — COMPREHENSIVE METABOLIC PANEL
ALT: 25 U/L (ref 0–35)
AST: 17 U/L (ref 0–37)
Albumin: 4.6 g/dL (ref 3.5–5.2)
Alkaline Phosphatase: 76 U/L (ref 39–117)
BUN: 16 mg/dL (ref 6–23)
CO2: 29 mEq/L (ref 19–32)
Calcium: 9.5 mg/dL (ref 8.4–10.5)
Chloride: 102 mEq/L (ref 96–112)
Creatinine, Ser: 0.79 mg/dL (ref 0.40–1.20)
GFR: 89.5 mL/min (ref >60.00)
Glucose, Bld: 95 mg/dL (ref 70–99)
Potassium: 3.7 mEq/L (ref 3.5–5.1)
Sodium: 140 mEq/L (ref 135–145)
Total Bilirubin: 0.8 mg/dL (ref 0.2–1.2)
Total Protein: 7.1 g/dL (ref 6.0–8.3)

## 2022-04-15 LAB — CBC
HCT: 41.9 % (ref 36.0–46.0)
Hemoglobin: 14.1 g/dL (ref 12.0–15.0)
MCHC: 33.7 g/dL (ref 30.0–36.0)
MCV: 84.6 fl (ref 78.0–100.0)
Platelets: 270 10*3/uL (ref 150.0–400.0)
RBC: 4.96 Mil/uL (ref 3.87–5.11)
RDW: 13.6 % (ref 11.5–15.5)
WBC: 7.8 10*3/uL (ref 4.0–10.5)

## 2022-04-15 LAB — LIPID PANEL
Cholesterol: 172 mg/dL (ref 0–200)
HDL: 39.5 mg/dL (ref >39.00)
LDL Cholesterol: 112 mg/dL — ABNORMAL HIGH (ref 0–99)
NonHDL: 132.33
Total CHOL/HDL Ratio: 4
Triglycerides: 101 mg/dL (ref 0.0–149.0)
VLDL: 20.2 mg/dL (ref 0.0–40.0)

## 2022-04-15 LAB — TSH: TSH: 1.17 u[IU]/mL (ref 0.35–5.50)

## 2022-04-15 LAB — T4, FREE: Free T4: 0.78 ng/dL (ref 0.60–1.60)

## 2022-04-15 MED ORDER — HYDROCODONE-ACETAMINOPHEN 5-325 MG PO TABS
1.0000 | ORAL_TABLET | Freq: Four times a day (QID) | ORAL | 0 refills | Status: DC | PRN
  Filled 2022-04-15: qty 30, 8d supply, fill #0

## 2022-04-15 NOTE — Assessment & Plan Note (Signed)
Checking TSH and adjust as needed. Not on medication at this time.

## 2022-04-15 NOTE — Progress Notes (Signed)
   Subjective:   Patient ID: Vanessa Byrd, female    DOB: [birth year], 46 y.o.   MRN: [id number]  HPI The patient is here for physical. Also with new acute RLQ pain.  PMH, [facility], social history reviewed and updated  Review of Systems  Constitutional: Negative.   HENT: Negative.    Eyes: Negative.   Respiratory:  Negative for cough, chest tightness and shortness of breath.   Cardiovascular:  Negative for chest pain, palpitations and leg swelling.  Gastrointestinal:  Positive for abdominal pain. Negative for abdominal distention, constipation, diarrhea, nausea and vomiting.  Musculoskeletal: Negative.   Skin: Negative.   Neurological: Negative.   Psychiatric/Behavioral: Negative.      Objective:  Physical Exam Constitutional:      Appearance: She is well-developed.  HENT:     Head: Normocephalic and atraumatic.  Cardiovascular:     Rate and Rhythm: Normal rate and regular rhythm.  Pulmonary:     Effort: Pulmonary effort is normal. No respiratory distress.     Breath sounds: Normal breath sounds. No wheezing or rales.  Abdominal:     General: Bowel sounds are normal. There is no distension.     Palpations: Abdomen is soft.     Tenderness: There is abdominal tenderness. There is no rebound.     Comments: 8-10 pain RLQ. No rebound or guarding.  Musculoskeletal:     Cervical back: Normal range of motion.  Skin:    General: Skin is warm and dry.  Neurological:     Mental Status: She is alert and oriented to person, place, and time.     Coordination: Coordination normal.     Vitals:   04/15/22 1344  BP: 130/80  Pulse: 78  Temp: 98 F (36.7 C)  TempSrc: Oral  SpO2: 96%  Weight: 193 lb (87.5 kg)  Height: '5\' 3"'$  (1.6 m)    Assessment & Plan:

## 2022-04-15 NOTE — Assessment & Plan Note (Signed)
Checking lipid panel. 

## 2022-04-15 NOTE — Patient Instructions (Signed)
We will get the labs and CT scan today.

## 2022-04-15 NOTE — Assessment & Plan Note (Addendum)
Concern for appendicitis. Checking CBC and stat CT abdomen and pelvis. Rx hydrocodone for pain 5/325 as she has tried and failed otc already.

## 2022-04-15 NOTE — Assessment & Plan Note (Signed)
Flu shot yearly. Covid-19 counseled. Tetanus up to date. Colonoscopy up to date. Mammogram up to date, pap smear up to date. Counseled about sun safety and mole surveillance. Counseled about the dangers of distracted driving. Given 10 year screening recommendations.

## 2022-05-04 ENCOUNTER — Encounter: Payer: Self-pay | Admitting: Internal Medicine

## 2022-05-12 ENCOUNTER — Encounter: Payer: Self-pay | Admitting: Internal Medicine

## 2022-05-12 DIAGNOSIS — M7061 Trochanteric bursitis, right hip: Secondary | ICD-10-CM

## 2022-05-23 NOTE — Addendum Note (Signed)
Addended by: Pricilla Holm A on: 05/23/2022 09:01 AM   Modules accepted: Orders

## 2022-06-01 ENCOUNTER — Ambulatory Visit: Payer: No Typology Code available for payment source | Attending: Internal Medicine | Admitting: Physical Therapy

## 2022-06-01 ENCOUNTER — Encounter: Payer: Self-pay | Admitting: Physical Therapy

## 2022-06-01 DIAGNOSIS — M25651 Stiffness of right hip, not elsewhere classified: Secondary | ICD-10-CM | POA: Insufficient documentation

## 2022-06-01 DIAGNOSIS — M7061 Trochanteric bursitis, right hip: Secondary | ICD-10-CM | POA: Insufficient documentation

## 2022-06-01 DIAGNOSIS — M25551 Pain in right hip: Secondary | ICD-10-CM | POA: Diagnosis not present

## 2022-06-01 NOTE — Therapy (Signed)
OUTPATIENT PHYSICAL THERAPY LOWER EXTREMITY EVALUATION   Patient Name: Vanessa Byrd MRN: [id number] DOB:1976-04-02, 46 y.o., female Today's Date: 06/01/2022   PT End of Session - 06/01/22 0808     Visit Number 1    Number of Visits 12    Date for PT Re-Evaluation 07/13/22    Authorization Type [employer] employee FOC[employer] plan    PT Start Time 0801    PT Stop Time 0845    PT Time Calculation (min) 44 min    Activity Tolerance Patient tolerated treatment well    Behavior During Therapy [facility] for tasks assessed/performed             Past Medical History:  Diagnosis Date   Colitis    Esophagitis    H. pylori infection    Sinusitis    Past Surgical History:  Procedure Laterality Date   TONSILLECTOMY     WISDOM TOOTH EXTRACTION     Patient Active Problem List   Diagnosis Date Noted   Greater trochanteric bursitis of right hip 03/03/2022   Pelvic pain 02/04/2022   Encounter for general adult medical examination with abnormal findings 02/04/2022   Other specified hypothyroidism 11/05/2021   Cyst, dermoid, scalp and neck 11/05/2021   Fibroid 11/05/2021   Morbid obesity ([place]) 11/05/2021   Left foot pain 04/23/2021   Other fatigue 04/23/2021   Pre-diabetes 04/23/2021   Allergic contact dermatitis due to plants, except food 02/12/2021   Chronic foot pain, right 05/21/2020   Iron deficiency 07/19/2018   Gastroesophageal reflux disease with esophagitis 06/13/2018   Right lower quadrant abdominal pain 12/12/2017   Anxiety and depression 05/31/2013   ALLERGIC RHINITIS 11/24/2009    PCP: Pricilla Holm, MD   REFERRING PROVIDER: Pricilla Holm, MD   REFERRING DIAG: Rt hip pain   THERAPY DIAG:  Pain in right hip  Stiffness of right hip, not elsewhere classified  Rationale for Evaluation and Treatment Rehabilitation  ONSET DATE: Aug 2023  SUBJECTIVE:   SUBJECTIVE STATEMENT: Pain began in August and was severe at that time in Rt lower quadrant  CT neg  for appendicitis or cyst.  The pain did gradually improve but changed to be more lateral on the hip and more moderate.  She has associated Rt hip stiffness , pulling and pain when crossing legs.   She has felt a couple of times, a " pop" in that area and then a and throbbing sensation.  Pain when going from sit to stand and transitioning.  She is trying to lose weight and become healthier by increasing activity.  (TM, weights, fxnl training). The pain does not interfere with her workouts unless they are intense and so she had avoided using Lower body wgt machines. She denies sensory changes or weakness.     PERTINENT HISTORY:  Has some lower back pain across both sides, sporadic.   PAIN:  Are you having pain? Yes: NPRS scale: 2/10 Pain location: Rt hip.   Pain description: tightness, pulling , throbbing  Aggravating factors: lying on Rt side. , sit to stand Relieving factors: changing position, heat/ice, hip,flexor stretch    PRECAUTIONS: None  WEIGHT BEARING RESTRICTIONS No  FALLS:  Has patient fallen in last 6 months? No  LIVING ENVIRONMENT: Lives with: lives with their spouse Lives in: House/apartment Stairs: no diff.  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   PLOF: Independent. Has a son in college .   PATIENT GOALS I want to be able  to move without pain, understand what the problem.    OBJECTIVE:   DIAGNOSTIC FINDINGS: neg CT scan   PATIENT SURVEYS:  FOTO 72%  COGNITION:  Overall cognitive status: Within functional limits for tasks assessed     SENSATION: WFL  EDEMA:  None   MUSCLE LENGTH: Hamstrings: Right tight  deg; Left less tight deg Thomas test: Right normal  deg; Left normal, tight L TFL  deg  POSTURE: No Significant postural limitations  PALPATION: TTP along Rt TFL but mostly Rt glute medius, minimus and beside L5, spasm and pain in Rt quadratus lumborum.   LOWER EXTREMITY ROM:  Active ROM Right eval  Left eval  Hip flexion 100 deg  100 deg   Hip extension    Hip abduction    Hip adduction    Hip internal rotation [facility]  Hip external rotation Tight, approx. 30 deg  Tight ,approx. 40 deg  Knee flexion    Knee extension    Ankle dorsiflexion    Ankle plantarflexion    Ankle inversion    Ankle eversion     (Blank rows = not tested)  LOWER EXTREMITY MMT:  MMT Right eval Left eval  Hip flexion 5/5 5/5  Hip extension 4+/5 4+/5  Hip abduction 4+/5 5/5  Hip adduction    Hip internal rotation    Hip external rotation    Knee flexion 5/5 5/5  Knee extension 5/5 5/5  Ankle dorsiflexion    Ankle plantarflexion    Ankle inversion    Ankle eversion     (Blank rows = not tested)  LOWER EXTREMITY SPECIAL TESTS:  Hip special tests: Saralyn Pilar (FABER) test: positive , Thomas test: negative, and Hip scouring test: negative  FUNCTIONAL TESTS:  NT  SLS WNL  Squats without pain in Rt hip, has knee discomfort   GAIT: Distance walked: 150 Assistive device utilized: None Level of assistance: Complete Independence Comments: no deviations     TODAY'S TREATMENT: PT , HEP, self care: manual therapy, trigger point release tennis ball   PATIENT EDUCATION:  Education details: HEP outer hip, tennis ball, Dry needling  Person educated: Patient Education method: Consulting civil engineer, Media planner, Verbal cues, and Handouts Education comprehension: verbalized understanding and needs further education   HOME EXERCISE PROGRAM: Access Code: [email or web address] URL: [email or web address] Date: 06/01/2022 Prepared by: Raeford Razor  Exercises - Supine Bilateral Hip Internal Rotation Stretch  - 2 x daily - 7 x weekly - 2 sets - 10 reps - 5-10 hold - Pigeon Pose  - 2 x daily - 7 x weekly - 1 sets - 3 reps - 30 hold - Clamshell with Resistance  - 1-2 x daily - 7 x weekly - 2 sets - 10 reps - 0-5 hold  ASSESSMENT:  CLINICAL IMPRESSION: Patient is a 46 y.o. female who was seen today for  physical therapy evaluation and treatment for Rt hip pain .    OBJECTIVE IMPAIRMENTS decreased mobility, difficulty walking, decreased ROM, hypomobility, increased fascial restrictions, impaired flexibility, obesity, and pain.   ACTIVITY LIMITATIONS sitting, standing, squatting, sleeping, transfers, and locomotion level  PARTICIPATION LIMITATIONS: shopping, community activity, and fitness  PERSONAL FACTORS 1 comorbidity: back pain   are also affecting patient's functional outcome.   REHAB POTENTIAL: Excellent  CLINICAL DECISION MAKING: Stable/uncomplicated  EVALUATION COMPLEXITY: Low   GOALS: Goals reviewed with patient? Yes FOTO score will improve to 79% Baseline: 72% Goal status: INITIAL  2.  PT will be able to sleep on her Rt side without increased  pain in Rt hip.  Baseline:  Goal status: INITIAL  3.  Pt will be able to sit with her Rt leg crossed over Lt. To don footwear.  Baseline:  Goal status: INITIAL  4.  Pt will be able to complete HEP and lower body workout without increased pain .  Baseline:  Goal status: INITIAL  5.  Pt will be able to walk in the community as needed without increased hip pain .  Baseline:  Goal status: INITIAL   PLAN: PT FREQUENCY: 2x/week  PT DURATION: 6 weeks  PLANNED INTERVENTIONS: Therapeutic exercises, Therapeutic activity, Neuromuscular re-education, Balance training, Gait training, Patient/Family education, Self Care, Joint mobilization, Dry Needling, Spinal mobilization, Cryotherapy, Moist heat, Taping, Ionotophoresis '4mg'$ /ml Dexamethasone, Manual therapy, and Re-evaluation  PLAN FOR NEXT SESSION: check HEP. Manual QL, Glute , DN hip mobility    Katja Blue, PT 06/01/2022, 11:35 AM   Raeford Razor, PT 06/01/22 11:52 AM Phone: [phone] Fax: [phone]

## 2022-06-08 ENCOUNTER — Ambulatory Visit: Payer: No Typology Code available for payment source

## 2022-06-08 DIAGNOSIS — M25551 Pain in right hip: Secondary | ICD-10-CM

## 2022-06-08 DIAGNOSIS — M25651 Stiffness of right hip, not elsewhere classified: Secondary | ICD-10-CM

## 2022-06-08 NOTE — Therapy (Signed)
OUTPATIENT PHYSICAL THERAPY TREATMENT NOTE   Patient Name: Thomasene Dubow MRN: [id number] DOB:Nov 28, 1975, 46 y.o., female Today's Date: 06/08/2022  PCP: Pricilla Holm, MD  REFERRING PROVIDER: Pricilla Holm, MD   END OF SESSION:   PT End of Session - 06/08/22 0726     Visit Number 2    Number of Visits 12    Date for PT Re-Evaluation 07/13/22    Authorization Type [employer] employee FOC[employer] plan    PT Start Time 0725   Pt 10 minslate for appt   PT Stop Time 0800    PT Time Calculation (min) 35 min    Activity Tolerance Patient tolerated treatment well    Behavior During Therapy [facility] for tasks assessed/performed             Past Medical History:  Diagnosis Date   Colitis    Esophagitis    H. pylori infection    Sinusitis    Past Surgical History:  Procedure Laterality Date   TONSILLECTOMY     WISDOM TOOTH EXTRACTION     Patient Active Problem List   Diagnosis Date Noted   Greater trochanteric bursitis of right hip 03/03/2022   Pelvic pain 02/04/2022   Encounter for general adult medical examination with abnormal findings 02/04/2022   Other specified hypothyroidism 11/05/2021   Cyst, dermoid, scalp and neck 11/05/2021   Fibroid 11/05/2021   Morbid obesity ([place]) 11/05/2021   Left foot pain 04/23/2021   Other fatigue 04/23/2021   Pre-diabetes 04/23/2021   Allergic contact dermatitis due to plants, except food 02/12/2021   Chronic foot pain, right 05/21/2020   Iron deficiency 07/19/2018   Gastroesophageal reflux disease with esophagitis 06/13/2018   Right lower quadrant abdominal pain 12/12/2017   Anxiety and depression 05/31/2013   ALLERGIC RHINITIS 11/24/2009    REFERRING DIAG: Pain in right hip  THERAPY DIAG:  Pain in right hip  Stiffness of right hip, not elsewhere classified  Rationale for Evaluation and Treatment Rehabilitation  SUBJECTIVE:    SUBJECTIVE STATEMENT: Pain is doing better. Pt reports doing the exs every other day.    PAIN:  Are you having pain? Yes: NPRS scale: 2/10 Pain location: Rt hip.   Pain description: tightness, pulling , throbbing  Aggravating factors: lying on Rt side. , sit to stand Relieving factors: changing position, heat/ice, hip,flexor stretch   Pian range: 2-5/10/10   PRECAUTIONS: None   WEIGHT BEARING RESTRICTIONS No   FALLS:  Has patient fallen in last 6 months? No   LIVING ENVIRONMENT: Lives with: lives with their spouse Lives in: House/apartment Stairs: no diff.  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    PLOF: Independent. Has a son in college .    PATIENT GOALS I want to be able to move without pain, understand what the problem.      OBJECTIVE: (objective measures completed at initial evaluation unless otherwise dated)   DIAGNOSTIC FINDINGS: neg CT scan    PATIENT SURVEYS:  FOTO 72%   COGNITION:           Overall cognitive status: Within functional limits for tasks assessed                          SENSATION: WFL   EDEMA:  None    MUSCLE LENGTH: Hamstrings: Right tight  deg; Left less tight deg Thomas test: Right normal  deg; Left normal, tight L TFL  deg  POSTURE: No Significant postural limitations   PALPATION: TTP along Rt TFL but mostly Rt glute medius, minimus and beside L5, spasm and pain in Rt quadratus lumborum.    LOWER EXTREMITY ROM:   Active ROM Right eval Left eval  Hip flexion 100 deg  100 deg   Hip extension      Hip abduction      Hip adduction      Hip internal rotation [facility]  Hip external rotation Tight, approx. 30 deg  Tight ,approx. 40 deg  Knee flexion      Knee extension      Ankle dorsiflexion      Ankle plantarflexion      Ankle inversion      Ankle eversion       (Blank rows = not tested)   LOWER EXTREMITY MMT:   MMT Right eval Left eval  Hip flexion 5/5 5/5  Hip extension 4+/5 4+/5  Hip abduction 4+/5 5/5  Hip adduction      Hip internal rotation      Hip  external rotation      Knee flexion 5/5 5/5  Knee extension 5/5 5/5  Ankle dorsiflexion      Ankle plantarflexion      Ankle inversion      Ankle eversion       (Blank rows = not tested)   LOWER EXTREMITY SPECIAL TESTS:  Hip special tests: Saralyn Pilar (FABER) test: positive , Thomas test: negative, and Hip scouring test: negative   FUNCTIONAL TESTS:  NT  SLS WNL  Squats without pain in Rt hip, has knee discomfort    GAIT: Distance walked: 150 Assistive device utilized: None Level of assistance: Complete Independence Comments: no deviations        TODAY'S TREATMENT: [facility] Adult PT Treatment:                                                DATE: 06/08/22 Therapeutic Exercise: Supine Bilateral Hip Internal Rotation Stretch  x3 10" Pigeon Pose  x2 30" Clamshell BLuTB  2 x 10 3" Piriformis strech x2  20" Figure 4 stretch x2 20' Standing ITB stretch x2 20'  PT , HEP, self care: manual therapy, trigger point release tennis ball    PATIENT EDUCATION:  Education details: HEP outer hip, tennis ball, Dry needling  Person educated: Patient Education method: Consulting civil engineer, Demonstration, Verbal cues, and Handouts Education comprehension: verbalized understanding and needs further education     HOME EXERCISE PROGRAM: Access Code: [email or web address] URL: [email or web address] Date: 06/08/2022 Prepared by: Gar Ponto  Exercises - Supine Bilateral Hip Internal Rotation Stretch  - 2 x daily - 7 x weekly - 2 sets - 10 reps - 5-10 hold - Pigeon Pose  - 2 x daily - 7 x weekly - 1 sets - 3 reps - 30 hold - Clamshell with Resistance  - 1-2 x daily - 7 x weekly - 2 sets - 10 reps - 0-5 hold - Supine Piriformis Stretch with Foot on Ground  - 1 x daily - 7 x weekly - 1 sets - 3 reps - 20 hold - Supine Figure 4 Piriformis Stretch  - 1 x daily - 7 x weekly - 1 sets - 3 reps - 20 hold - Standing ITB Stretch  - 1 x daily - 7 x weekly - 1 sets -  3 reps - 20 hold   ASSESSMENT:   CLINICAL  IMPRESSION: Pt is responding positively to the initail PT therex. PT was completed today to review her HEP and to provided additional flexibility therex to address her tight hip Irs. Crossing legs is limited for pt. Discused TPDN and pt is open to treatment the next session.   OBJECTIVE IMPAIRMENTS decreased mobility, difficulty walking, decreased ROM, hypomobility, increased fascial restrictions, impaired flexibility, obesity, and pain.    ACTIVITY LIMITATIONS sitting, standing, squatting, sleeping, transfers, and locomotion level   PARTICIPATION LIMITATIONS: shopping, community activity, and fitness   PERSONAL FACTORS 1 comorbidity: back pain   are also affecting patient's functional outcome.    REHAB POTENTIAL: Excellent   CLINICAL DECISION MAKING: Stable/uncomplicated   EVALUATION COMPLEXITY: Low     GOALS: Goals reviewed with patient? Yes FOTO score will improve to 79% Baseline: 72% Goal status: INITIAL   2.  PT will be able to sleep on her Rt side without increased pain in Rt hip.  Baseline:  Goal status: INITIAL   3.  Pt will be able to sit with her Rt leg crossed over Lt. To don footwear.  Baseline:  Goal status: INITIAL   4.  Pt will be able to complete HEP and lower body workout without increased pain .  Baseline:  Goal status: INITIAL   5.  Pt will be able to walk in the community as needed without increased hip pain .  Baseline:  Goal status: INITIAL     PLAN: PT FREQUENCY: 2x/week   PT DURATION: 6 weeks   PLANNED INTERVENTIONS: Therapeutic exercises, Therapeutic activity, Neuromuscular re-education, Balance training, Gait training, Patient/Family education, Self Care, Joint mobilization, Dry Needling, Spinal mobilization, Cryotherapy, Moist heat, Taping, Ionotophoresis '4mg'$ /ml Dexamethasone, Manual therapy, and Re-evaluation   PLAN FOR NEXT SESSION: check HEP. Manual QL, Glute , DN hip mobility    Rien Marland MS, PT 06/08/22 11:03 AM

## 2022-06-09 NOTE — Therapy (Signed)
OUTPATIENT PHYSICAL THERAPY TREATMENT NOTE   Patient Name: Vanessa Byrd MRN: [id number] DOB:03/15/1976, 46 y.o., female Today's Date: 06/10/2022  PCP: Pricilla Holm, MD  REFERRING PROVIDER: Pricilla Holm, MD   END OF SESSION:   PT End of Session - 06/10/22 0744     Visit Number 3    Number of Visits 12    Date for PT Re-Evaluation 07/13/22    Authorization Type [employer] employee [employer] plan    PT Start Time [phone]    PT Stop Time 0803    PT Time Calculation (min) 45 min    Activity Tolerance Patient tolerated treatment well    Behavior During Therapy [facility] for tasks assessed/performed              Past Medical History:  Diagnosis Date   Colitis    Esophagitis    H. pylori infection    Sinusitis    Past Surgical History:  Procedure Laterality Date   TONSILLECTOMY     WISDOM TOOTH EXTRACTION     Patient Active Problem List   Diagnosis Date Noted   Greater trochanteric bursitis of right hip 03/03/2022   Pelvic pain 02/04/2022   Encounter for general adult medical examination with abnormal findings 02/04/2022   Other specified hypothyroidism 11/05/2021   Cyst, dermoid, scalp and neck 11/05/2021   Fibroid 11/05/2021   Morbid obesity ([place]) 11/05/2021   Left foot pain 04/23/2021   Other fatigue 04/23/2021   Pre-diabetes 04/23/2021   Allergic contact dermatitis due to plants, except food 02/12/2021   Chronic foot pain, right 05/21/2020   Iron deficiency 07/19/2018   Gastroesophageal reflux disease with esophagitis 06/13/2018   Right lower quadrant abdominal pain 12/12/2017   Anxiety and depression 05/31/2013   ALLERGIC RHINITIS 11/24/2009    REFERRING DIAG: Pain in right hip  THERAPY DIAG:  Pain in right hip  Stiffness of right hip, not elsewhere classified  Rationale for Evaluation and Treatment Rehabilitation  SUBJECTIVE:    SUBJECTIVE STATEMENT: R hip pain continues to do better. More of an soreness than a throb.  PAIN:  Are you  having pain? Yes: NPRS scale: 1/10 Pain location: Rt hip.   Pain description: tightness, pulling , throbbing  Aggravating factors: lying on Rt side. , sit to stand Relieving factors: changing position, heat/ice, hip,flexor stretch   Pian range: 2-5/10/10   PRECAUTIONS: None   WEIGHT BEARING RESTRICTIONS No   FALLS:  Has patient fallen in last 6 months? No   LIVING ENVIRONMENT: Lives with: lives with their spouse Lives in: House/apartment Stairs: no diff.  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    PLOF: Independent. Has a son in college .    PATIENT GOALS I want to be able to move without pain, understand what the problem.      OBJECTIVE: (objective measures completed at initial evaluation unless otherwise dated)   DIAGNOSTIC FINDINGS: neg CT scan    PATIENT SURVEYS:  FOTO 72%   COGNITION:           Overall cognitive status: Within functional limits for tasks assessed                          SENSATION: WFL   EDEMA:  None    MUSCLE LENGTH: Hamstrings: Right tight  deg; Left less tight deg Thomas test: Right normal  deg; Left normal, tight L TFL  deg   POSTURE: No Significant  postural limitations   PALPATION: TTP along Rt TFL but mostly Rt glute medius, minimus and beside L5, spasm and pain in Rt quadratus lumborum.    LOWER EXTREMITY ROM:   Active ROM Right eval Left eval  Hip flexion 100 deg  100 deg   Hip extension      Hip abduction      Hip adduction      Hip internal rotation [facility]  Hip external rotation Tight, approx. 30 deg  Tight ,approx. 40 deg  Knee flexion      Knee extension      Ankle dorsiflexion      Ankle plantarflexion      Ankle inversion      Ankle eversion       (Blank rows = not tested)   LOWER EXTREMITY MMT:   MMT Right eval Left eval  Hip flexion 5/5 5/5  Hip extension 4+/5 4+/5  Hip abduction 4+/5 5/5  Hip adduction      Hip internal rotation      Hip external rotation       Knee flexion 5/5 5/5  Knee extension 5/5 5/5  Ankle dorsiflexion      Ankle plantarflexion      Ankle inversion      Ankle eversion       (Blank rows = not tested)   LOWER EXTREMITY SPECIAL TESTS:  Hip special tests: Saralyn Pilar (FABER) test: positive , Thomas test: negative, and Hip scouring test: negative   FUNCTIONAL TESTS:  NT  SLS WNL  Squats without pain in Rt hip, has knee discomfort    GAIT: Distance walked: 150 Assistive device utilized: None Level of assistance: Complete Independence Comments: no deviations        TODAY'S TREATMENT: [facility] Adult PT Treatment:                                                DATE: 06/10/22 Therapeutic Exercise: Bridge c BluTB x10 10" SL clam BluTB 2x10 3" HL hip add sets c ball x10 5" SL hip abd 2x 10 3" Pigeon Pose  x2 30" Piriformis strech x2  20" Standing ITB stretch x2 20' Updated HEP  Manual Therapy STM to gluteus medius and minimus muscles Skilled palpation to ID taut muscle bands and TrPs     Trigger Point Dry Needling Treatment: Pre-treatment instruction: Patient instructed on dry needling rationale, procedures, and possible side effects including pain during treatment (achy,cramping feeling), bruising, drop of blood, lightheadedness, nausea, sweating. Patient Consent Given: Yes Education handout provided: Yes Muscles treated: Gluteus medius and minimus  Needle size and number:  156mx.30 1 needle Electrical stimulation performed: No Parameters: N/A Treatment response/outcome: Twitch response elicited and Palpable decrease in muscle tension Post-treatment instructions: Patient instructed to expect possible mild to moderate muscle soreness later today and/or tomorrow. Patient instructed in methods to reduce muscle soreness and to continue prescribed HEP. If patient was dry needled over the lung field, patient was instructed on signs and symptoms of pneumothorax and, however unlikely, to see immediate medical attention should  they occur. Patient was also educated on signs and symptoms of infection and to seek medical attention should they occur. Patient verbalized understanding of these instructions and education.   [facility]  [facility]: 06/08/22 Therapeutic Exercise: Supine Bilateral Hip Internal Rotation Stretch  x3 10" Pigeon Pose  x2 30" Clamshell BLuTB  2 x 10 3" Piriformis strech x2  20" Figure 4 stretch x2 20' Standing ITB stretch x2 20'  PT , HEP, self care: manual therapy, trigger point release tennis ball    PATIENT EDUCATION:  Education details: HEP outer hip, tennis ball, Dry needling  Person educated: Patient Education method: Explanation, Demonstration, Verbal cues, and Handouts Education comprehension: verbalized understanding and needs further education     HOME EXERCISE PROGRAM: Access Code: [email or web address] URL: [email or web address] Date: 06/10/2022 Prepared by: Gar Ponto  Exercises - Supine Bilateral Hip Internal Rotation Stretch  - 2 x daily - 7 x weekly - 2 sets - 10 reps - 5-10 hold - Pigeon Pose  - 2 x daily - 7 x weekly - 1 sets - 3 reps - 30 hold - Clamshell with Resistance  - 1-2 x daily - 7 x weekly - 2 sets - 10 reps - 0-5 hold - Supine Piriformis Stretch with Foot on Ground  - 1 x daily - 7 x weekly - 1 sets - 3 reps - 20 hold - Supine Figure 4 Piriformis Stretch  - 1 x daily - 7 x weekly - 1 sets - 3 reps - 20 hold - Standing ITB Stretch  - 1 x daily - 7 x weekly - 1 sets - 3 reps - 20 hold - Supine Bridge  - 1 x daily - 7 x weekly - 1 sets - 10 reps - 10 hold - Sidelying Hip Abduction  - 1 x daily - 7 x weekly - 2 sets - 10 reps - 3 hold - Supine Hip Adduction Isometric with Ball  - 1 x daily - 7 x weekly - 1 sets - 10 reps - 5 hold   ASSESSMENT:   CLINICAL IMPRESSION: PT was completed for STM to the R medius and minimus muscles f/b TPDN to taut muscle bands and TrPs. Therex was then completed for  flexibility and strengthening of the hips. Pt tolerated PT today without adverse effects. Pt will continue to benefit from skilled PT to address impairments for improved function with less pain. Will assess pt's response to TPDN when the pt's returns for her next PT session.   OBJECTIVE IMPAIRMENTS decreased mobility, difficulty walking, decreased ROM, hypomobility, increased fascial restrictions, impaired flexibility, obesity, and pain.    ACTIVITY LIMITATIONS sitting, standing, squatting, sleeping, transfers, and locomotion level   PARTICIPATION LIMITATIONS: shopping, community activity, and fitness   PERSONAL FACTORS 1 comorbidity: back pain   are also affecting patient's functional outcome.    REHAB POTENTIAL: Excellent   CLINICAL DECISION MAKING: Stable/uncomplicated   EVALUATION COMPLEXITY: Low     GOALS: Goals reviewed with patient? Yes FOTO score will improve to 79% Baseline: 72% Goal status: INITIAL   2.  PT will be able to sleep on her Rt side without increased pain in Rt hip.  Baseline:  Goal status: INITIAL   3.  Pt will be able to sit with her Rt leg crossed over Lt. To don footwear.  Baseline:  Goal status: INITIAL   4.  Pt will be able to complete HEP and lower body workout without increased pain .  Baseline:  Goal status: INITIAL   5.  Pt will be able to walk in the community as needed without increased hip pain .  Baseline:  Goal status: INITIAL     PLAN: PT FREQUENCY: 2x/week  PT DURATION: 6 weeks   PLANNED INTERVENTIONS: Therapeutic exercises, Therapeutic activity, Neuromuscular re-education, Balance training, Gait training, Patient/Family education, Self Care, Joint mobilization, Dry Needling, Spinal mobilization, Cryotherapy, Moist heat, Taping, Ionotophoresis '4mg'$ /ml Dexamethasone, Manual therapy, and Re-evaluation   PLAN FOR NEXT SESSION: check HEP. Manual QL, Glute , DN hip mobility    Kenedee Molesky MS, PT 06/10/22 10:02 PM

## 2022-06-10 ENCOUNTER — Ambulatory Visit: Payer: No Typology Code available for payment source

## 2022-06-10 DIAGNOSIS — M25551 Pain in right hip: Secondary | ICD-10-CM | POA: Diagnosis not present

## 2022-06-10 DIAGNOSIS — M25651 Stiffness of right hip, not elsewhere classified: Secondary | ICD-10-CM

## 2022-06-10 NOTE — Patient Instructions (Signed)

## 2022-06-20 ENCOUNTER — Ambulatory Visit: Payer: No Typology Code available for payment source | Admitting: Physical Therapy

## 2022-06-21 NOTE — Therapy (Incomplete)
OUTPATIENT PHYSICAL THERAPY TREATMENT NOTE   Patient Name: Dimples Probus MRN: [id number] DOB:1976-06-06, 46 y.o., female Today's Date: 06/21/2022  PCP: Pricilla Holm, MD  REFERRING PROVIDER: Pricilla Holm, MD   END OF SESSION:      Past Medical History:  Diagnosis Date   Colitis    Esophagitis    H. pylori infection    Sinusitis    Past Surgical History:  Procedure Laterality Date   TONSILLECTOMY     WISDOM TOOTH EXTRACTION     Patient Active Problem List   Diagnosis Date Noted   Greater trochanteric bursitis of right hip 03/03/2022   Pelvic pain 02/04/2022   Encounter for general adult medical examination with abnormal findings 02/04/2022   Other specified hypothyroidism 11/05/2021   Cyst, dermoid, scalp and neck 11/05/2021   Fibroid 11/05/2021   Morbid obesity ([place]) 11/05/2021   Left foot pain 04/23/2021   Other fatigue 04/23/2021   Pre-diabetes 04/23/2021   Allergic contact dermatitis due to plants, except food 02/12/2021   Chronic foot pain, right 05/21/2020   Iron deficiency 07/19/2018   Gastroesophageal reflux disease with esophagitis 06/13/2018   Right lower quadrant abdominal pain 12/12/2017   Anxiety and depression 05/31/2013   ALLERGIC RHINITIS 11/24/2009    REFERRING DIAG: Pain in right hip  THERAPY DIAG:  No diagnosis found.  Rationale for Evaluation and Treatment Rehabilitation  SUBJECTIVE:    SUBJECTIVE STATEMENT: R hip pain continues to do better. More of an soreness than a throb.  PAIN:  Are you having pain? Yes: NPRS scale: 1/10 Pain location: Rt hip.   Pain description: tightness, pulling , throbbing  Aggravating factors: lying on Rt side. , sit to stand Relieving factors: changing position, heat/ice, hip,flexor stretch   Pian range: 2-5/10/10   PRECAUTIONS: None   WEIGHT BEARING RESTRICTIONS No   FALLS:  Has patient fallen in last 6 months? No   LIVING ENVIRONMENT: Lives with: lives with their  spouse Lives in: House/apartment Stairs: no diff.  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    PLOF: Independent. Has a son in college .    PATIENT GOALS I want to be able to move without pain, understand what the problem.      OBJECTIVE: (objective measures completed at initial evaluation unless otherwise dated)   DIAGNOSTIC FINDINGS: neg CT scan    PATIENT SURVEYS:  FOTO 72%   COGNITION:           Overall cognitive status: Within functional limits for tasks assessed                          SENSATION: WFL   EDEMA:  None    MUSCLE LENGTH: Hamstrings: Right tight  deg; Left less tight deg Thomas test: Right normal  deg; Left normal, tight L TFL  deg   POSTURE: No Significant postural limitations   PALPATION: TTP along Rt TFL but mostly Rt glute medius, minimus and beside L5, spasm and pain in Rt quadratus lumborum.    LOWER EXTREMITY ROM:   Active ROM Right eval Left eval  Hip flexion 100 deg  100 deg   Hip extension      Hip abduction      Hip adduction      Hip internal rotation [facility]  Hip external rotation Tight, approx. 30 deg  Tight ,approx. 40 deg  Knee flexion      Knee extension  Ankle dorsiflexion      Ankle plantarflexion      Ankle inversion      Ankle eversion       (Blank rows = not tested)   LOWER EXTREMITY MMT:   MMT Right eval Left eval  Hip flexion 5/5 5/5  Hip extension 4+/5 4+/5  Hip abduction 4+/5 5/5  Hip adduction      Hip internal rotation      Hip external rotation      Knee flexion 5/5 5/5  Knee extension 5/5 5/5  Ankle dorsiflexion      Ankle plantarflexion      Ankle inversion      Ankle eversion       (Blank rows = not tested)   LOWER EXTREMITY SPECIAL TESTS:  Hip special tests: Saralyn Pilar (FABER) test: positive , Thomas test: negative, and Hip scouring test: negative   FUNCTIONAL TESTS:  NT  SLS WNL  Squats without pain in Rt hip, has knee discomfort    GAIT: Distance  walked: 150 Assistive device utilized: None Level of assistance: Complete Independence Comments: no deviations        TODAY'S TREATMENT: [facility] Adult PT Treatment:                                                DATE: 06/22/22 Therapeutic Exercise: *** Manual Therapy: *** Neuromuscular re-ed: *** Therapeutic Activity: *** Modalities: *** Self Care: ***  [facility] Adult PT Treatment:                                                DATE: 06/10/22 Therapeutic Exercise: Bridge c BluTB x10 10" SL clam BluTB 2x10 3" HL hip add sets c ball x10 5" SL hip abd 2x 10 3" Pigeon Pose  x2 30" Piriformis strech x2  20" Standing ITB stretch x2 20' Updated HEP  Manual Therapy STM to gluteus medius and minimus muscles Skilled palpation to ID taut muscle bands and TrPs     Trigger Point Dry Needling Treatment: Pre-treatment instruction: Patient instructed on dry needling rationale, procedures, and possible side effects including pain during treatment (achy,cramping feeling), bruising, drop of blood, lightheadedness, nausea, sweating. Patient Consent Given: Yes Education handout provided: Yes Muscles treated: Gluteus medius and minimus  Needle size and number:  163mx.30 1 needle Electrical stimulation performed: No Parameters: N/A Treatment response/outcome: Twitch response elicited and Palpable decrease in muscle tension Post-treatment instructions: Patient instructed to expect possible mild to moderate muscle soreness later today and/or tomorrow. Patient instructed in methods to reduce muscle soreness and to continue prescribed HEP. If patient was dry needled over the lung field, patient was instructed on signs and symptoms of pneumothorax and, however unlikely, to see immediate medical attention should they occur. Patient was also educated on signs and symptoms of infection and to seek medical attention should they occur. Patient verbalized understanding of these instructions and education.    [facility]                                                [facility]:  06/08/22 Therapeutic Exercise: Supine Bilateral Hip Internal Rotation Stretch  x3 10" Pigeon Pose  x2 30" Clamshell BLuTB  2 x 10 3" Piriformis strech x2  20" Figure 4 stretch x2 20' Standing ITB stretch x2 20'  PT , HEP, self care: manual therapy, trigger point release tennis ball    PATIENT EDUCATION:  Education details: HEP outer hip, tennis ball, Dry needling  Person educated: Patient Education method: Explanation, Demonstration, Verbal cues, and Handouts Education comprehension: verbalized understanding and needs further education     HOME EXERCISE PROGRAM: Access Code: [email or web address] URL: [email or web address] Date: 06/10/2022 Prepared by: Gar Ponto  Exercises - Supine Bilateral Hip Internal Rotation Stretch  - 2 x daily - 7 x weekly - 2 sets - 10 reps - 5-10 hold - Pigeon Pose  - 2 x daily - 7 x weekly - 1 sets - 3 reps - 30 hold - Clamshell with Resistance  - 1-2 x daily - 7 x weekly - 2 sets - 10 reps - 0-5 hold - Supine Piriformis Stretch with Foot on Ground  - 1 x daily - 7 x weekly - 1 sets - 3 reps - 20 hold - Supine Figure 4 Piriformis Stretch  - 1 x daily - 7 x weekly - 1 sets - 3 reps - 20 hold - Standing ITB Stretch  - 1 x daily - 7 x weekly - 1 sets - 3 reps - 20 hold - Supine Bridge  - 1 x daily - 7 x weekly - 1 sets - 10 reps - 10 hold - Sidelying Hip Abduction  - 1 x daily - 7 x weekly - 2 sets - 10 reps - 3 hold - Supine Hip Adduction Isometric with Ball  - 1 x daily - 7 x weekly - 1 sets - 10 reps - 5 hold   ASSESSMENT:   CLINICAL IMPRESSION: PT was completed for STM to the R medius and minimus muscles f/b TPDN to taut muscle bands and TrPs. Therex was then completed for flexibility and strengthening of the hips. Pt tolerated PT today without adverse effects. Pt will continue to benefit from skilled PT to address impairments for improved function with less pain.  Will assess pt's response to TPDN when the pt's returns for her next PT session.   OBJECTIVE IMPAIRMENTS decreased mobility, difficulty walking, decreased ROM, hypomobility, increased fascial restrictions, impaired flexibility, obesity, and pain.    ACTIVITY LIMITATIONS sitting, standing, squatting, sleeping, transfers, and locomotion level   PARTICIPATION LIMITATIONS: shopping, community activity, and fitness   PERSONAL FACTORS 1 comorbidity: back pain   are also affecting patient's functional outcome.    REHAB POTENTIAL: Excellent   CLINICAL DECISION MAKING: Stable/uncomplicated   EVALUATION COMPLEXITY: Low     GOALS: Goals reviewed with patient? Yes FOTO score will improve to 79% Baseline: 72% Goal status: INITIAL   2.  PT will be able to sleep on her Rt side without increased pain in Rt hip.  Baseline:  Goal status: INITIAL   3.  Pt will be able to sit with her Rt leg crossed over Lt. To don footwear.  Baseline:  Goal status: INITIAL   4.  Pt will be able to complete HEP and lower body workout without increased pain .  Baseline:  Goal status: INITIAL   5.  Pt will be able to walk in the community as needed without increased hip pain .  Baseline:  Goal status: INITIAL     PLAN: PT FREQUENCY: 2x/week  PT DURATION: 6 weeks   PLANNED INTERVENTIONS: Therapeutic exercises, Therapeutic activity, Neuromuscular re-education, Balance training, Gait training, Patient/Family education, Self Care, Joint mobilization, Dry Needling, Spinal mobilization, Cryotherapy, Moist heat, Taping, Ionotophoresis '4mg'$ /ml Dexamethasone, Manual therapy, and Re-evaluation   PLAN FOR NEXT SESSION: check HEP. Manual QL, Glute , DN hip mobility    Garmon Dehn MS, PT 06/21/22 9:15 PM

## 2022-06-22 ENCOUNTER — Ambulatory Visit: Payer: No Typology Code available for payment source

## 2022-06-28 ENCOUNTER — Ambulatory Visit: Payer: No Typology Code available for payment source | Admitting: Physical Therapy

## 2022-06-29 NOTE — Therapy (Unsigned)
OUTPATIENT PHYSICAL THERAPY TREATMENT NOTE   Patient Name: Vanessa Byrd MRN: [id number] DOB:Sep 12, 1975, 46 y.o., female Today's Date: 06/30/2022  PCP: Pricilla Holm, MD  REFERRING PROVIDER: Pricilla Holm, MD   END OF SESSION:   PT End of Session - 06/30/22 0808     Visit Number 4    Number of Visits 12    Date for PT Re-Evaluation 07/13/22    Authorization Type [employer] employee FOCUS plan    PT Start Time 0806    PT Stop Time 0845    PT Time Calculation (min) 39 min    Activity Tolerance Patient tolerated treatment well    Behavior During Therapy [facility] for tasks assessed/performed               Past Medical History:  Diagnosis Date   Colitis    Esophagitis    H. pylori infection    Sinusitis    Past Surgical History:  Procedure Laterality Date   TONSILLECTOMY     WISDOM TOOTH EXTRACTION     Patient Active Problem List   Diagnosis Date Noted   Greater trochanteric bursitis of right hip 03/03/2022   Pelvic pain 02/04/2022   Encounter for general adult medical examination with abnormal findings 02/04/2022   Other specified hypothyroidism 11/05/2021   Cyst, dermoid, scalp and neck 11/05/2021   Fibroid 11/05/2021   Morbid obesity ([place]) 11/05/2021   Left foot pain 04/23/2021   Other fatigue 04/23/2021   Pre-diabetes 04/23/2021   Allergic contact dermatitis due to plants, except food 02/12/2021   Chronic foot pain, right 05/21/2020   Iron deficiency 07/19/2018   Gastroesophageal reflux disease with esophagitis 06/13/2018   Right lower quadrant abdominal pain 12/12/2017   Anxiety and depression 05/31/2013   ALLERGIC RHINITIS 11/24/2009    REFERRING DIAG: Pain in right hip  THERAPY DIAG:  Pain in right hip  Stiffness of right hip, not elsewhere classified  Rationale for Evaluation and Treatment Rehabilitation  SUBJECTIVE:    SUBJECTIVE STATEMENT: Is still coughing from illness but feeling much better.  No pain right now.   PAIN:   Are you having pain? Yes: NPRS scale: 1/10 Pain location: Rt hip.   Pain description: tightness, pulling , throbbing  Aggravating factors: lying on Rt side. , sit to stand Relieving factors: changing position, heat/ice, hip,flexor stretch   Pian range: 2-5/10/10   PRECAUTIONS: None   WEIGHT BEARING RESTRICTIONS No   FALLS:  Has patient fallen in last 6 months? No   LIVING ENVIRONMENT: Lives with: lives with their spouse Lives in: House/apartment Stairs: no diff.  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    PLOF: Independent. Has a son in college .    PATIENT GOALS I want to be able to move without pain, understand what the problem.      OBJECTIVE: (objective measures completed at initial evaluation unless otherwise dated)   DIAGNOSTIC FINDINGS: neg CT scan    PATIENT SURVEYS:  FOTO 72%   COGNITION:           Overall cognitive status: Within functional limits for tasks assessed                          SENSATION: WFL   EDEMA:  None    MUSCLE LENGTH: Hamstrings: Right tight  deg; Left less tight deg Thomas test: Right normal  deg; Left normal, tight L TFL  deg   POSTURE:  No Significant postural limitations   PALPATION: TTP along Rt TFL but mostly Rt glute medius, minimus and beside L5, spasm and pain in Rt quadratus lumborum.    LOWER EXTREMITY ROM:   Active ROM Right eval Left eval  Hip flexion 100 deg  100 deg   Hip extension      Hip abduction      Hip adduction      Hip internal rotation [facility]  Hip external rotation Tight, approx. 30 deg  Tight ,approx. 40 deg  Knee flexion      Knee extension      Ankle dorsiflexion      Ankle plantarflexion      Ankle inversion      Ankle eversion       (Blank rows = not tested)   LOWER EXTREMITY MMT:   MMT Right eval Left eval  Hip flexion 5/5 5/5  Hip extension 4+/5 4+/5  Hip abduction 4+/5 5/5  Hip adduction      Hip internal rotation      Hip external  rotation      Knee flexion 5/5 5/5  Knee extension 5/5 5/5  Ankle dorsiflexion      Ankle plantarflexion      Ankle inversion      Ankle eversion       (Blank rows = not tested)   LOWER EXTREMITY SPECIAL TESTS:  Hip special tests: Saralyn Pilar (FABER) test: positive , Thomas test: negative, and Hip scouring test: negative   FUNCTIONAL TESTS:  NT  SLS WNL  Squats without pain in Rt hip, has knee discomfort    GAIT: Distance walked: 150 Assistive device utilized: None Level of assistance: Complete Independence Comments: no deviations        TODAY'S TREATMENT:   [facility] Adult PT Treatment:                                                DATE: 06/30/22 Therapeutic Exercise: Recumbent bike L2 for 6 min  Supine wide knee LTR x 10 Hamstring, ITB and adductor with strap Bridge x 15  Banded bridge x 15 Rt LE off table for ant hip stretch  1/2 tall kneeling anterior hip stretch each side  Manual Therapy: IASTM to Rt thigh anterior and lateral done in mod. Thomas stretch position< 5 min    [facility] Adult PT Treatment:                                                DATE: 06/10/22 Therapeutic Exercise: Bridge c BluTB x10 10" SL clam BluTB 2x10 3" HL hip add sets c ball x10 5" SL hip abd 2x 10 3" Pigeon Pose  x2 30" Piriformis strech x2  20" Standing ITB stretch x2 20' Updated HEP  Manual Therapy STM to gluteus medius and minimus muscles Skilled palpation to ID taut muscle bands and TrPs     Trigger Point Dry Needling Treatment: Pre-treatment instruction: Patient instructed on dry needling rationale, procedures, and possible side effects including pain during treatment (achy,cramping feeling), bruising, drop of blood, lightheadedness, nausea, sweating. Patient Consent Given: Yes Education handout provided: Yes Muscles treated: Gluteus medius and minimus  Needle size and number:  121mx.30 1 needle  Electrical stimulation performed: No Parameters: N/A Treatment response/outcome:  Twitch response elicited and Palpable decrease in muscle tension Post-treatment instructions: Patient instructed to expect possible mild to moderate muscle soreness later today and/or tomorrow. Patient instructed in methods to reduce muscle soreness and to continue prescribed HEP. If patient was dry needled over the lung field, patient was instructed on signs and symptoms of pneumothorax and, however unlikely, to see immediate medical attention should they occur. Patient was also educated on signs and symptoms of infection and to seek medical attention should they occur. Patient verbalized understanding of these instructions and education.   [facility] Adult PT Treatment:                                                DATE: 06/08/22 Therapeutic Exercise: Supine Bilateral Hip Internal Rotation Stretch  x3 10" Pigeon Pose  x2 30" Clamshell BLuTB  2 x 10 3" Piriformis strech x2  20" Figure 4 stretch x2 20' Standing ITB stretch x2 20'  PT , HEP, self care: manual therapy, trigger point release tennis ball    PATIENT EDUCATION:  Education details: HEP outer hip, tennis ball, Dry needling  Person educated: Patient Education method: Explanation, Demonstration, Verbal cues, and Handouts Education comprehension: verbalized understanding and needs further education     HOME EXERCISE PROGRAM: Access Code: [email or web address] URL: [email or web address] Date: 06/10/2022 Prepared by: Gar Ponto  Exercises - Supine Bilateral Hip Internal Rotation Stretch  - 2 x daily - 7 x weekly - 2 sets - 10 reps - 5-10 hold - Pigeon Pose  - 2 x daily - 7 x weekly - 1 sets - 3 reps - 30 hold - Clamshell with Resistance  - 1-2 x daily - 7 x weekly - 2 sets - 10 reps - 0-5 hold - Supine Piriformis Stretch with Foot on Ground  - 1 x daily - 7 x weekly - 1 sets - 3 reps - 20 hold - Supine Figure 4 Piriformis Stretch  - 1 x daily - 7 x weekly - 1 sets - 3 reps - 20 hold - Standing ITB Stretch  - 1 x daily - 7 x weekly -  1 sets - 3 reps - 20 hold - Supine Bridge  - 1 x daily - 7 x weekly - 1 sets - 10 reps - 10 hold - Sidelying Hip Abduction  - 1 x daily - 7 x weekly - 2 sets - 10 reps - 3 hold - Supine Hip Adduction Isometric with Ball  - 1 x daily - 7 x weekly - 1 sets - 10 reps - 5 hold   ASSESSMENT:   CLINICAL IMPRESSION: Patient has been ill for the past 2-3 visits. She has continued to do her exercises to a degree but not daily.  She has had less difficulty sleeping on her Rt side but last night noticed an ache.  She hopes to be able to get back to the gym now that she is feeling better.  She was able to walk throughout [place] with some min to mod difficulty overall.  Tightness in external rotation and anterior hip as well with bridging.  Cont POC.    OBJECTIVE IMPAIRMENTS decreased mobility, difficulty walking, decreased ROM, hypomobility, increased fascial restrictions, impaired flexibility, obesity, and pain.    ACTIVITY LIMITATIONS sitting, standing, squatting, sleeping, transfers, and locomotion level  PARTICIPATION LIMITATIONS: shopping, community activity, and fitness   PERSONAL FACTORS 1 comorbidity: back pain   are also affecting patient's functional outcome.    REHAB POTENTIAL: Excellent   CLINICAL DECISION MAKING: Stable/uncomplicated   EVALUATION COMPLEXITY: Low     GOALS: Goals reviewed with patient? Yes FOTO score will improve to 79% Baseline: 72% Goal status: ongoing    2.  PT will be able to sleep on her Rt side without increased pain in Rt hip.  Baseline: 06/30/22: better but last night was an issue Goal status: ongoing   3.  Pt will be able to sit with her Rt leg crossed over Lt. To don footwear.  Baseline: can do this now but still limited , L knee pain and increased effort Goal status: ongoing   4.  Pt will be able to complete HEP and lower body workout without increased pain .  Baseline:  Goal status: ongoing    5.  Pt will be able to walk in the community as  needed without increased hip pain .  Baseline: was at [facility] a couple of weeks ago, improved Goal status: ongoing     PLAN: PT FREQUENCY: 2x/week   PT DURATION: 6 weeks   PLANNED INTERVENTIONS: Therapeutic exercises, Therapeutic activity, Neuromuscular re-education, Balance training, Gait training, Patient/Family education, Self Care, Joint mobilization, Dry Needling, Spinal mobilization, Cryotherapy, Moist heat, Taping, Ionotophoresis '4mg'$ /ml Dexamethasone, Manual therapy, and Re-evaluation   PLAN FOR NEXT SESSION: check HEP. Manual QL, Glute , DN hip mobility    Raeford Razor, PT 06/30/22 10:07 AM Phone: [phone] Fax: [phone]

## 2022-06-30 ENCOUNTER — Ambulatory Visit: Payer: No Typology Code available for payment source | Attending: Internal Medicine | Admitting: Physical Therapy

## 2022-06-30 ENCOUNTER — Encounter: Payer: Self-pay | Admitting: Physical Therapy

## 2022-06-30 DIAGNOSIS — M25551 Pain in right hip: Secondary | ICD-10-CM | POA: Insufficient documentation

## 2022-06-30 DIAGNOSIS — M25651 Stiffness of right hip, not elsewhere classified: Secondary | ICD-10-CM | POA: Diagnosis present

## 2022-07-06 ENCOUNTER — Ambulatory Visit: Payer: No Typology Code available for payment source

## 2022-07-06 DIAGNOSIS — M25551 Pain in right hip: Secondary | ICD-10-CM | POA: Diagnosis not present

## 2022-07-06 DIAGNOSIS — M25651 Stiffness of right hip, not elsewhere classified: Secondary | ICD-10-CM

## 2022-07-06 NOTE — Therapy (Signed)
OUTPATIENT PHYSICAL THERAPY TREATMENT NOTE   Patient Name: Vanessa Byrd MRN: [id number] DOB:[birth year], 46 y.o., female Today's Date: 07/06/2022  PCP: Pricilla Holm, MD  REFERRING PROVIDER: Pricilla Holm, MD   END OF SESSION:   PT End of Session - 07/06/22 0744     Visit Number 5    Number of Visits 12    Date for PT Re-Evaluation 07/13/22    Authorization Type [employer] employee FOCUS plan    PT Start Time 0723    PT Stop Time 0758    PT Time Calculation (min) 35 min    Activity Tolerance Patient tolerated treatment well    Behavior During Therapy [facility] for tasks assessed/performed                Past Medical History:  Diagnosis Date   Colitis    Esophagitis    H. pylori infection    Sinusitis    Past Surgical History:  Procedure Laterality Date   TONSILLECTOMY     WISDOM TOOTH EXTRACTION     Patient Active Problem List   Diagnosis Date Noted   Greater trochanteric bursitis of right hip 03/03/2022   Pelvic pain 02/04/2022   Encounter for general adult medical examination with abnormal findings 02/04/2022   Other specified hypothyroidism 11/05/2021   Cyst, dermoid, scalp and neck 11/05/2021   Fibroid 11/05/2021   Morbid obesity ([place]) 11/05/2021   Left foot pain 04/23/2021   Other fatigue 04/23/2021   Pre-diabetes 04/23/2021   Allergic contact dermatitis due to plants, except food 02/12/2021   Chronic foot pain, right 05/21/2020   Iron deficiency 07/19/2018   Gastroesophageal reflux disease with esophagitis 06/13/2018   Right lower quadrant abdominal pain 12/12/2017   Anxiety and depression 05/31/2013   ALLERGIC RHINITIS 11/24/2009    REFERRING DIAG: Pain in right hip  THERAPY DIAG:  Pain in right hip  Stiffness of right hip, not elsewhere classified  Rationale for Evaluation and Treatment Rehabilitation  SUBJECTIVE:    SUBJECTIVE STATEMENT: Pt reports she is doing better, less pain and better ROM of her R hip. Pt notes being  able to cross her R leg over the L and put on her shoe and sock.  PAIN:  Are you having pain? Yes: NPRS scale: 1/10 Pain location: Rt hip.   Pain description: tightness, pulling , throbbing  Aggravating factors: lying on Rt side. , sit to stand Relieving factors: changing position, heat/ice, hip,flexor stretch   Pian range: 2-5/10/10   PRECAUTIONS: None   WEIGHT BEARING RESTRICTIONS No   FALLS:  Has patient fallen in last 6 months? No   LIVING ENVIRONMENT: Lives with: lives with their spouse Lives in: House/apartment Stairs: no diff.  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    PLOF: Independent. Has a son in college .    PATIENT GOALS I want to be able to move without pain, understand what the problem.      OBJECTIVE: (objective measures completed at initial evaluation unless otherwise dated)   DIAGNOSTIC FINDINGS: neg CT scan    PATIENT SURVEYS:  FOTO 72%   COGNITION:           Overall cognitive status: Within functional limits for tasks assessed                          SENSATION: WFL   EDEMA:  None    MUSCLE LENGTH: Hamstrings: Right tight  deg;  Left less tight deg Thomas test: Right normal  deg; Left normal, tight L TFL  deg   POSTURE: No Significant postural limitations   PALPATION: TTP along Rt TFL but mostly Rt glute medius, minimus and beside L5, spasm and pain in Rt quadratus lumborum.    LOWER EXTREMITY ROM:   Active ROM Right eval Left eval  Hip flexion 100 deg  100 deg   Hip extension      Hip abduction      Hip adduction      Hip internal rotation [facility]  Hip external rotation Tight, approx. 30 deg  Tight ,approx. 40 deg  Knee flexion      Knee extension      Ankle dorsiflexion      Ankle plantarflexion      Ankle inversion      Ankle eversion       (Blank rows = not tested)   LOWER EXTREMITY MMT:   MMT Right eval Left eval  Hip flexion 5/5 5/5  Hip extension 4+/5 4+/5  Hip abduction  4+/5 5/5  Hip adduction      Hip internal rotation      Hip external rotation      Knee flexion 5/5 5/5  Knee extension 5/5 5/5  Ankle dorsiflexion      Ankle plantarflexion      Ankle inversion      Ankle eversion       (Blank rows = not tested)   LOWER EXTREMITY SPECIAL TESTS:  Hip special tests: Saralyn Pilar (FABER) test: positive , Thomas test: negative, and Hip scouring test: negative   FUNCTIONAL TESTS:  NT  SLS WNL  Squats without pain in Rt hip, has knee discomfort    GAIT: Distance walked: 150 Assistive device utilized: None Level of assistance: Complete Independence Comments: no deviations        TODAY'S TREATMENT: [facility] Adult PT Treatment:                                                DATE: 07/06/22 Therapeutic Exercise: Supine wide knee LTR x 10 Hamstring, ITB and adductor stretches with strap Piriformis and figure 4 stretches Banded bridge 2x10 Hip clams x15 10sec BluTB Manual Therapy: Manual axial distraction of the R hip/LE Self Care: Education and pt completion for tennis ball massage to the posterior gluteal muscles and to the TFL Education and pt completion for R hip axial distraction c belt at home  [facility] Adult PT Treatment:                                                DATE: 06/30/22 Therapeutic Exercise: Recumbent bike L2 for 6 min  Supine wide knee LTR x 10 Hamstring, ITB and adductor with strap Bridge x 15  Banded bridge x 15 Rt LE off table for ant hip stretch  1/2 tall kneeling anterior hip stretch each side  Manual Therapy: IASTM to Rt thigh anterior and lateral done in mod. Thomas stretch position< 5 min    [facility] Adult PT Treatment:  DATE: 06/10/22 Therapeutic Exercise: Bridge c BluTB x10 10" SL clam BluTB 2x10 3" HL hip add sets c ball x10 5" SL hip abd 2x 10 3" Pigeon Pose  x2 30" Piriformis strech x2  20" Standing ITB stretch x2 20' Updated HEP  Manual Therapy STM to gluteus medius  and minimus muscles Skilled palpation to ID taut muscle bands and TrPs     Trigger Point Dry Needling Treatment: Pre-treatment instruction: Patient instructed on dry needling rationale, procedures, and possible side effects including pain during treatment (achy,cramping feeling), bruising, drop of blood, lightheadedness, nausea, sweating. Patient Consent Given: Yes Education handout provided: Yes Muscles treated: Gluteus medius and minimus  Needle size and number:  150mx.30 1 needle Electrical stimulation performed: No Parameters: N/A Treatment response/outcome: Twitch response elicited and Palpable decrease in muscle tension Post-treatment instructions: Patient instructed to expect possible mild to moderate muscle soreness later today and/or tomorrow. Patient instructed in methods to reduce muscle soreness and to continue prescribed HEP. If patient was dry needled over the lung field, patient was instructed on signs and symptoms of pneumothorax and, however unlikely, to see immediate medical attention should they occur. Patient was also educated on signs and symptoms of infection and to seek medical attention should they occur. Patient verbalized understanding of these instructions and education.   [facility]:                                                DATE: 06/08/22 Therapeutic Exercise: Supine Bilateral Hip Internal Rotation Stretch  x3 10" Pigeon Pose  x2 30" Clamshell BLuTB  2 x 10 3" Piriformis strech x2  20" Figure 4 stretch x2 20' Standing ITB stretch x2 20'  PT , HEP, self care: manual therapy, trigger point release tennis ball    PATIENT EDUCATION:  Education details: HEP outer hip, tennis ball, Dry needling  Person educated: Patient Education method: Explanation, Demonstration, Verbal cues, and Handouts Education comprehension: verbalized understanding and needs further education     HOME EXERCISE PROGRAM: Access Code: [email or web address] URL:  [email or web address] Date: 06/10/2022 Prepared by: AGar Ponto Exercises - Supine Bilateral Hip Internal Rotation Stretch  - 2 x daily - 7 x weekly - 2 sets - 10 reps - 5-10 hold - Pigeon Pose  - 2 x daily - 7 x weekly - 1 sets - 3 reps - 30 hold - Clamshell with Resistance  - 1-2 x daily - 7 x weekly - 2 sets - 10 reps - 0-5 hold - Supine Piriformis Stretch with Foot on Ground  - 1 x daily - 7 x weekly - 1 sets - 3 reps - 20 hold - Supine Figure 4 Piriformis Stretch  - 1 x daily - 7 x weekly - 1 sets - 3 reps - 20 hold - Standing ITB Stretch  - 1 x daily - 7 x weekly - 1 sets - 3 reps - 20 hold - Supine Bridge  - 1 x daily - 7 x weekly - 1 sets - 10 reps - 10 hold - Sidelying Hip Abduction  - 1 x daily - 7 x weekly - 2 sets - 10 reps - 3 hold - Supine Hip Adduction Isometric with Ball  - 1 x daily - 7 x weekly - 1 sets - 10 reps - 5 hold   ASSESSMENT:   CLINICAL  IMPRESSION: Pt is responding well to PT for R hip flexibility and strengthening. R hip ROM is improved with pt able to cross her R leg over the L. In addition to therex, pt received axial distraction to the r hip and was instructed in and completed L hip distraction and also use of a tennis ball for massage to the posterior gluteal muscles and the TFL muscle. Pt tolerated PT today without adverse effects. Pt will continue to benefit from skilled PT to address impairments for improved function with less pain.   OBJECTIVE IMPAIRMENTS decreased mobility, difficulty walking, decreased ROM, hypomobility, increased fascial restrictions, impaired flexibility, obesity, and pain.    ACTIVITY LIMITATIONS sitting, standing, squatting, sleeping, transfers, and locomotion level   PARTICIPATION LIMITATIONS: shopping, community activity, and fitness   PERSONAL FACTORS 1 comorbidity: back pain   are also affecting patient's functional outcome.    REHAB POTENTIAL: Excellent   CLINICAL DECISION MAKING: Stable/uncomplicated    EVALUATION COMPLEXITY: Low     GOALS: Goals reviewed with patient? Yes FOTO score will improve to 79% Baseline: 72% Goal status: ongoing    2.  PT will be able to sleep on her Rt side without increased pain in Rt hip.  Baseline: 06/30/22: better but last night was an issue Goal status: ongoing   3.  Pt will be able to sit with her Rt leg crossed over Lt. To don footwear.  Baseline: can do this now but still limited , L knee pain and increased effort Goal status: ongoing   4.  Pt will be able to complete HEP and lower body workout without increased pain .  Baseline:  Goal status: ongoing    5.  Pt will be able to walk in the community as needed without increased hip pain .  Baseline: was at [facility] a couple of weeks ago, improved Goal status: ongoing    PLAN: PT FREQUENCY: 2x/week   PT DURATION: 6 weeks   PLANNED INTERVENTIONS: Therapeutic exercises, Therapeutic activity, Neuromuscular re-education, Balance training, Gait training, Patient/Family education, Self Care, Joint mobilization, Dry Needling, Spinal mobilization, Cryotherapy, Moist heat, Taping, Ionotophoresis '4mg'$ /ml Dexamethasone, Manual therapy, and Re-evaluation   PLAN FOR NEXT SESSION: check HEP. Manual QL, Glute , DN hip mobility   Florentine Diekman MS, PT 07/06/22 9:31 AM

## 2022-07-08 ENCOUNTER — Ambulatory Visit: Payer: No Typology Code available for payment source | Admitting: Physical Therapy

## 2022-07-08 DIAGNOSIS — M25651 Stiffness of right hip, not elsewhere classified: Secondary | ICD-10-CM

## 2022-07-08 DIAGNOSIS — M25551 Pain in right hip: Secondary | ICD-10-CM | POA: Diagnosis not present

## 2022-07-08 NOTE — Therapy (Signed)
OUTPATIENT PHYSICAL THERAPY TREATMENT NOTE   Patient Name: Vanessa Byrd MRN: [id number] DOB:December 07, 1975, 46 y.o., female Today's Date: 07/08/2022  PCP: Pricilla Holm, MD  REFERRING PROVIDER: Pricilla Holm, MD   END OF SESSION:   PT End of Session - 07/08/22 0719     Visit Number 6    Number of Visits 12    Date for PT Re-Evaluation 07/13/22    Authorization Type [employer] employee FOC[employer] plan    PT Start Time [phone]    PT Stop Time 0800    PT Time Calculation (min) 42 min                Past Medical History:  Diagnosis Date   Colitis    Esophagitis    H. pylori infection    Sinusitis    Past Surgical History:  Procedure Laterality Date   TONSILLECTOMY     WISDOM TOOTH EXTRACTION     Patient Active Problem List   Diagnosis Date Noted   Greater trochanteric bursitis of right hip 03/03/2022   Pelvic pain 02/04/2022   Encounter for general adult medical examination with abnormal findings 02/04/2022   Other specified hypothyroidism 11/05/2021   Cyst, dermoid, scalp and neck 11/05/2021   Fibroid 11/05/2021   Morbid obesity ([place]) 11/05/2021   Left foot pain 04/23/2021   Other fatigue 04/23/2021   Pre-diabetes 04/23/2021   Allergic contact dermatitis due to plants, except food 02/12/2021   Chronic foot pain, right 05/21/2020   Iron deficiency 07/19/2018   Gastroesophageal reflux disease with esophagitis 06/13/2018   Right lower quadrant abdominal pain 12/12/2017   Anxiety and depression 05/31/2013   ALLERGIC RHINITIS 11/24/2009    REFERRING DIAG: Pain in right hip  THERAPY DIAG:  Pain in right hip  Stiffness of right hip, not elsewhere classified  Rationale for Evaluation and Treatment Rehabilitation  SUBJECTIVE:    SUBJECTIVE STATEMENT: Pt reports pain with getting up in the morning to standing,  getting out of car, pain level minimal at 1/10, tightness.  PAIN:  Are you having pain? Yes: NPRS scale: 1/10 Pain location: Rt hip.    Pain description: tightness, Aggravating factors: lying on Rt side. , sit to stand Relieving factors: changing position, heat/ice, hip,flexor stretch   Pian range: 2-5/10/10   PRECAUTIONS: None   WEIGHT BEARING RESTRICTIONS No   FALLS:  Has patient fallen in last 6 months? No   LIVING ENVIRONMENT: Lives with: lives with their spouse Lives in: House/apartment Stairs: no diff.  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    PLOF: Independent. Has a son in college .    PATIENT GOALS I want to be able to move without pain, understand what the problem.      OBJECTIVE: (objective measures completed at initial evaluation unless otherwise dated)   DIAGNOSTIC FINDINGS: neg CT scan    PATIENT SURVEYS:  FOTO 72% FOTO  72%   COGNITION:           Overall cognitive status: Within functional limits for tasks assessed                          SENSATION: WFL   EDEMA:  None    MUSCLE LENGTH: Hamstrings: Right tight  deg; Left less tight deg Thomas test: Right normal  deg; Left normal, tight L TFL  deg   POSTURE: No Significant postural limitations   PALPATION: TTP along Rt TFL but  mostly Rt glute medius, minimus and beside L5, spasm and pain in Rt quadratus lumborum.    LOWER EXTREMITY ROM:   Active ROM Right eval Left eval  Hip flexion 100 deg  100 deg   Hip extension      Hip abduction      Hip adduction      Hip internal rotation [facility]  Hip external rotation Tight, approx. 30 deg  Tight ,approx. 40 deg  Knee flexion      Knee extension      Ankle dorsiflexion      Ankle plantarflexion      Ankle inversion      Ankle eversion       (Blank rows = not tested)   LOWER EXTREMITY MMT:   MMT Right eval Left eval  Hip flexion 5/5 5/5  Hip extension 4+/5 4+/5  Hip abduction 4+/5 5/5  Hip adduction      Hip internal rotation      Hip external rotation      Knee flexion 5/5 5/5  Knee extension 5/5 5/5  Ankle dorsiflexion       Ankle plantarflexion      Ankle inversion      Ankle eversion       (Blank rows = not tested)   LOWER EXTREMITY SPECIAL TESTS:  Hip special tests: Saralyn Pilar (FABER) test: positive , Thomas test: negative, and Hip scouring test: negative   FUNCTIONAL TESTS:  NT  SLS WNL  Squats without pain in Rt hip, has knee discomfort    GAIT: Distance walked: 150 Assistive device utilized: None Level of assistance: Complete Independence Comments: no deviations        TODAY'S TREATMENT: [facility] Adult PT Treatment:                                                DATE: 07/08/22 Therapeutic Exercise: STS x 10 , bariatric chair Standing quad/hip flexor stretch 2 x 20 sec each  Supine wide knee LTR x 10 Figure 4 with LTR Supine Bridge with blue band  Hamstring  stretch passive  Piriformis and figure 4 stretches Banded bridge 2x10 Hip clams x15 10sec BluTB Manual Therapy: Manual axial distraction of the R hip/LE A/P mobs to Right Hip   [facility] Adult PT Treatment:                                                DATE: 07/06/22 Therapeutic Exercise: Supine wide knee LTR x 10 Hamstring, ITB and adductor stretches with strap Piriformis and figure 4 stretches Banded bridge 2x10 Hip clams x15 10sec BluTB Manual Therapy: Manual axial distraction of the R hip/LE Self Care: Education and pt completion for tennis ball massage to the posterior gluteal muscles and to the TFL Education and pt completion for R hip axial distraction c belt at home  [facility] Adult PT Treatment:                                                DATE: 06/30/22 Therapeutic Exercise: Recumbent bike L2 for 6 min  Supine  wide knee LTR x 10 Hamstring, ITB and adductor with strap Bridge x 15  Banded bridge x 15 Rt LE off table for ant hip stretch  1/2 tall kneeling anterior hip stretch each side  Manual Therapy: IASTM to Rt thigh anterior and lateral done in mod. Thomas stretch position< 5 min    [facility] Adult PT Treatment:                                                 DATE: 06/10/22 Therapeutic Exercise: Bridge c BluTB x10 10" SL clam BluTB 2x10 3" HL hip add sets c ball x10 5" SL hip abd 2x 10 3" Pigeon Pose  x2 30" Piriformis strech x2  20" Standing ITB stretch x2 20' Updated HEP  Manual Therapy STM to gluteus medius and minimus muscles Skilled palpation to ID taut muscle bands and TrPs     Trigger Point Dry Needling Treatment: Pre-treatment instruction: Patient instructed on dry needling rationale, procedures, and possible side effects including pain during treatment (achy,cramping feeling), bruising, drop of blood, lightheadedness, nausea, sweating. Patient Consent Given: Yes Education handout provided: Yes Muscles treated: Gluteus medius and minimus  Needle size and number:  156mx.30 1 needle Electrical stimulation performed: No Parameters: N/A Treatment response/outcome: Twitch response elicited and Palpable decrease in muscle tension Post-treatment instructions: Patient instructed to expect possible mild to moderate muscle soreness later today and/or tomorrow. Patient instructed in methods to reduce muscle soreness and to continue prescribed HEP. If patient was dry needled over the lung field, patient was instructed on signs and symptoms of pneumothorax and, however unlikely, to see immediate medical attention should they occur. Patient was also educated on signs and symptoms of infection and to seek medical attention should they occur. Patient verbalized understanding of these instructions and education.   [facility]                                                DATE: 06/08/22 Therapeutic Exercise: Supine Bilateral Hip Internal Rotation Stretch  x3 10" Pigeon Pose  x2 30" Clamshell BLuTB  2 x 10 3" Piriformis strech x2  20" Figure 4 stretch x2 20' Standing ITB stretch x2 20'  PT , HEP, self care: manual therapy, trigger point release tennis ball    PATIENT EDUCATION:  Education  details: HEP outer hip, tennis ball, Dry needling  Person educated: Patient Education method: Explanation, Demonstration, Verbal cues, and Handouts Education comprehension: verbalized understanding and needs further education     HOME EXERCISE PROGRAM: Access Code: [email or web address] URL: [email or web address] Date: 06/10/2022 Prepared by: AGar Ponto Exercises - Supine Bilateral Hip Internal Rotation Stretch  - 2 x daily - 7 x weekly - 2 sets - 10 reps - 5-10 hold - Pigeon Pose  - 2 x daily - 7 x weekly - 1 sets - 3 reps - 30 hold - Clamshell with Resistance  - 1-2 x daily - 7 x weekly - 2 sets - 10 reps - 0-5 hold - Supine Piriformis Stretch with Foot on Ground  - 1 x daily - 7 x weekly - 1 sets - 3 reps - 20 hold - Supine Figure 4 Piriformis Stretch  - 1 x daily -  7 x weekly - 1 sets - 3 reps - 20 hold - Standing ITB Stretch  - 1 x daily - 7 x weekly - 1 sets - 3 reps - 20 hold - Supine Bridge  - 1 x daily - 7 x weekly - 1 sets - 10 reps - 10 hold - Sidelying Hip Abduction  - 1 x daily - 7 x weekly - 2 sets - 10 reps - 3 hold - Supine Hip Adduction Isometric with Ball  - 1 x daily - 7 x weekly - 1 sets - 10 reps - 5 hold   ASSESSMENT:   CLINICAL IMPRESSION: Pt reports 1/10 pain at most and this happens with STS in the morning and from low car seat, otherwise has been relatively pain free. Progressed to closed chain today with STS from bariatric chair. She felt pain on first 2 reps and then it subsided. Continued with hip flexibility and performed manual hip mobs to improve ROM. At end of session she felt right a little achy in right hip.  Pt tolerated PT today without adverse effects. Pt will continue to benefit from skilled PT to address impairments for improved function with less pain.   OBJECTIVE IMPAIRMENTS decreased mobility, difficulty walking, decreased ROM, hypomobility, increased fascial restrictions, impaired flexibility, obesity, and pain.    ACTIVITY LIMITATIONS  sitting, standing, squatting, sleeping, transfers, and locomotion level   PARTICIPATION LIMITATIONS: shopping, community activity, and fitness   PERSONAL FACTORS 1 comorbidity: back pain   are also affecting patient's functional outcome.    REHAB POTENTIAL: Excellent   CLINICAL DECISION MAKING: Stable/uncomplicated   EVALUATION COMPLEXITY: Low     GOALS: Goals reviewed with patient? Yes FOTO score will improve to 79% Baseline: 72% Goal status: ongoing    2.  PT will be able to sleep on her Rt side without increased pain in Rt hip.  Baseline: 06/30/22: better but last night was an issue Goal status: ongoing   3.  Pt will be able to sit with her Rt leg crossed over Lt. To don footwear.  Baseline: can do this now but still limited , L knee pain and increased effort Goal status: ongoing   4.  Pt will be able to complete HEP and lower body workout without increased pain .  Baseline:  Goal status: ongoing    5.  Pt will be able to walk in the community as needed without increased hip pain .  Baseline: was at [facility] a couple of weeks ago, improved Goal status: ongoing    PLAN: PT FREQUENCY: 2x/week   PT DURATION: 6 weeks   PLANNED INTERVENTIONS: Therapeutic exercises, Therapeutic activity, Neuromuscular re-education, Balance training, Gait training, Patient/Family education, Self Care, Joint mobilization, Dry Needling, Spinal mobilization, Cryotherapy, Moist heat, Taping, Ionotophoresis '4mg'$ /ml Dexamethasone, Manual therapy, and Re-evaluation   PLAN FOR NEXT SESSION: check HEP. Manual QL, Glute , DN hip mobility , STS progression  Hessie Diener, PTA 07/08/22 8:09 AM Phone: [phone] Fax: [phone]

## 2022-07-11 ENCOUNTER — Ambulatory Visit: Payer: No Typology Code available for payment source | Admitting: Physical Therapy

## 2022-07-11 ENCOUNTER — Encounter: Payer: Self-pay | Admitting: Physical Therapy

## 2022-07-11 DIAGNOSIS — M25551 Pain in right hip: Secondary | ICD-10-CM

## 2022-07-11 DIAGNOSIS — M25651 Stiffness of right hip, not elsewhere classified: Secondary | ICD-10-CM

## 2022-07-11 NOTE — Therapy (Signed)
OUTPATIENT PHYSICAL THERAPY TREATMENT NOTE   Patient Name: Vanessa Byrd MRN: [id number] DOB:Oct 25, 1975, 46 y.o., female Today's Date: 07/11/2022  PCP: Pricilla Holm, MD  REFERRING PROVIDER: Pricilla Holm, MD   END OF SESSION:   PT End of Session - 07/11/22 0724     Visit Number 7    Number of Visits 12    Date for PT Re-Evaluation 07/13/22    Authorization Type [employer] employee FOC[employer] plan    PT Start Time [phone]    PT Stop Time 0800    PT Time Calculation (min) 38 min                Past Medical History:  Diagnosis Date   Colitis    Esophagitis    H. pylori infection    Sinusitis    Past Surgical History:  Procedure Laterality Date   TONSILLECTOMY     WISDOM TOOTH EXTRACTION     Patient Active Problem List   Diagnosis Date Noted   Greater trochanteric bursitis of right hip 03/03/2022   Pelvic pain 02/04/2022   Encounter for general adult medical examination with abnormal findings 02/04/2022   Other specified hypothyroidism 11/05/2021   Cyst, dermoid, scalp and neck 11/05/2021   Fibroid 11/05/2021   Morbid obesity ([place]) 11/05/2021   Left foot pain 04/23/2021   Other fatigue 04/23/2021   Pre-diabetes 04/23/2021   Allergic contact dermatitis due to plants, except food 02/12/2021   Chronic foot pain, right 05/21/2020   Iron deficiency 07/19/2018   Gastroesophageal reflux disease with esophagitis 06/13/2018   Right lower quadrant abdominal pain 12/12/2017   Anxiety and depression 05/31/2013   ALLERGIC RHINITIS 11/24/2009    REFERRING DIAG: Pain in right hip  THERAPY DIAG:  Pain in right hip  Stiffness of right hip, not elsewhere classified  Rationale for Evaluation and Treatment Rehabilitation  SUBJECTIVE:    SUBJECTIVE STATEMENT: Pt reports she could feel the anteriolateral hip pain while she was shopping this weekend. It happens every time she gets out of the car.    PAIN:  Are you having pain? Yes: NPRS scale: 1/10 Pain  location: Rt hip.   Pain description: tightness, Aggravating factors: lying on Rt side. , sit to stand Relieving factors: changing position, heat/ice, hip,flexor stretch   Pian range: 2-5/10/10   PRECAUTIONS: None   WEIGHT BEARING RESTRICTIONS No   FALLS:  Has patient fallen in last 6 months? No   LIVING ENVIRONMENT: Lives with: lives with their spouse Lives in: House/apartment Stairs: no diff.  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    PLOF: Independent. Has a son in college .    PATIENT GOALS I want to be able to move without pain, understand what the problem.      OBJECTIVE: (objective measures completed at initial evaluation unless otherwise dated)   DIAGNOSTIC FINDINGS: neg CT scan    PATIENT SURVEYS:  FOTO 72% FOTO  72%   COGNITION:           Overall cognitive status: Within functional limits for tasks assessed                          SENSATION: WFL   EDEMA:  None    MUSCLE LENGTH: Hamstrings: Right tight  deg; Left less tight deg Thomas test: Right normal  deg; Left normal, tight L TFL  deg   POSTURE: No Significant postural limitations   PALPATION:  TTP along Rt TFL but mostly Rt glute medius, minimus and beside L5, spasm and pain in Rt quadratus lumborum.    LOWER EXTREMITY ROM:   Active ROM Right eval Left eval  Hip flexion 100 deg  100 deg   Hip extension      Hip abduction      Hip adduction      Hip internal rotation [facility]  Hip external rotation Tight, approx. 30 deg  Tight ,approx. 40 deg  Knee flexion      Knee extension      Ankle dorsiflexion      Ankle plantarflexion      Ankle inversion      Ankle eversion       (Blank rows = not tested)   LOWER EXTREMITY MMT:   MMT Right eval Left eval  Hip flexion 5/5 5/5  Hip extension 4+/5 4+/5  Hip abduction 4+/5 5/5  Hip adduction      Hip internal rotation      Hip external rotation      Knee flexion 5/5 5/5  Knee extension 5/5 5/5   Ankle dorsiflexion      Ankle plantarflexion      Ankle inversion      Ankle eversion       (Blank rows = not tested)   LOWER EXTREMITY SPECIAL TESTS:  Hip special tests: Saralyn Pilar (FABER) test: positive , Thomas test: negative, and Hip scouring test: negative   FUNCTIONAL TESTS:  NT  SLS WNL  Squats without pain in Rt hip, has knee discomfort    GAIT: Distance walked: 150 Assistive device utilized: None Level of assistance: Complete Independence Comments: no deviations        TODAY'S TREATMENT: [facility] Adult PT Treatment:                                                DATE: 07/11/22 Therapeutic Exercise: Rec Bike L3 x 5 minutes  STS x 10, standard chair 10# x10 with 15#  Standing quad/hip flexor stretch 2 x 20 sec each  Green band side step squats 15f x 3 each  way Supine Bridge with blue band  S/L blue band clams - cues for positioning  Verbally updated HEP- medbridge down- added STS and side squats green band   [facility] Adult PT Treatment:                                                DATE: 07/08/22 Therapeutic Exercise: STS x 10 , bariatric chair Standing quad/hip flexor stretch 2 x 20 sec each  Supine wide knee LTR x 10 Figure 4 with LTR Supine Bridge with blue band  Hamstring  stretch passive  Piriformis and figure 4 stretches Banded bridge 2x10 Hip clams x15 10sec BluTB Manual Therapy: Manual axial distraction of the R hip/LE A/P mobs to Right Hip   [facility] Adult PT Treatment:                                                DATE: 07/06/22 Therapeutic Exercise: Supine wide knee LTR  x 10 Hamstring, ITB and adductor stretches with strap Piriformis and figure 4 stretches Banded bridge 2x10 Hip clams x15 10sec BluTB Manual Therapy: Manual axial distraction of the R hip/LE Self Care: Education and pt completion for tennis ball massage to the posterior gluteal muscles and to the TFL Education and pt completion for R hip axial distraction c belt at home  [facility] Adult  PT Treatment:                                                DATE: 06/30/22 Therapeutic Exercise: Recumbent bike L2 for 6 min  Supine wide knee LTR x 10 Hamstring, ITB and adductor with strap Bridge x 15  Banded bridge x 15 Rt LE off table for ant hip stretch  1/2 tall kneeling anterior hip stretch each side  Manual Therapy: IASTM to Rt thigh anterior and lateral done in mod. Thomas stretch position< 5 min    [facility] Adult PT Treatment:                                                DATE: 06/10/22 Therapeutic Exercise: Bridge c BluTB x10 10" SL clam BluTB 2x10 3" HL hip add sets c ball x10 5" SL hip abd 2x 10 3" Pigeon Pose  x2 30" Piriformis strech x2  20" Standing ITB stretch x2 20' Updated HEP  Manual Therapy STM to gluteus medius and minimus muscles Skilled palpation to ID taut muscle bands and TrPs     Trigger Point Dry Needling Treatment: Pre-treatment instruction: Patient instructed on dry needling rationale, procedures, and possible side effects including pain during treatment (achy,cramping feeling), bruising, drop of blood, lightheadedness, nausea, sweating. Patient Consent Given: Yes Education handout provided: Yes Muscles treated: Gluteus medius and minimus  Needle size and number:  166mx.30 1 needle Electrical stimulation performed: No Parameters: N/A Treatment response/outcome: Twitch response elicited and Palpable decrease in muscle tension Post-treatment instructions: Patient instructed to expect possible mild to moderate muscle soreness later today and/or tomorrow. Patient instructed in methods to reduce muscle soreness and to continue prescribed HEP. If patient was dry needled over the lung field, patient was instructed on signs and symptoms of pneumothorax and, however unlikely, to see immediate medical attention should they occur. Patient was also educated on signs and symptoms of infection and to seek medical attention should they occur. Patient verbalized  understanding of these instructions and education.   [facility]                                                DATE: 06/08[facility]22 Therapeutic Exercise: Supine Bilateral Hip Internal Rotation Stretch  x3 10" Pigeon Pose  x2 30" Clamshell BLuTB  2 x 10 3" Piriformis strech x2  20" Figure 4 stretch x2 20' Standing ITB stretch x2 20'  PT , HEP, self care: manual therapy, trigger point release tennis ball    PATIENT EDUCATION:  Education details: HEP outer hip, tennis ball, Dry needling  Person educated: Patient Education method: Explanation, Demonstration, Verbal cues, and Handouts Education comprehension: verbalized understanding and needs further  education     HOME EXERCISE PROGRAM: Access Code: [email or web address] URL: [email or web address] Date: 06/10/2022 Prepared by: Gar Ponto  Exercises - Supine Bilateral Hip Internal Rotation Stretch  - 2 x daily - 7 x weekly - 2 sets - 10 reps - 5-10 hold - Pigeon Pose  - 2 x daily - 7 x weekly - 1 sets - 3 reps - 30 hold - Clamshell with Resistance  - 1-2 x daily - 7 x weekly - 2 sets - 10 reps - 0-5 hold - Supine Piriformis Stretch with Foot on Ground  - 1 x daily - 7 x weekly - 1 sets - 3 reps - 20 hold - Supine Figure 4 Piriformis Stretch  - 1 x daily - 7 x weekly - 1 sets - 3 reps - 20 hold - Standing ITB Stretch  - 1 x daily - 7 x weekly - 1 sets - 3 reps - 20 hold - Supine Bridge  - 1 x daily - 7 x weekly - 1 sets - 10 reps - 10 hold - Sidelying Hip Abduction  - 1 x daily - 7 x weekly - 2 sets - 10 reps - 3 hold - Supine Hip Adduction Isometric with Ball  - 1 x daily - 7 x weekly - 1 sets - 10 reps - 5 hold   ASSESSMENT:   CLINICAL IMPRESSION: Pt reports 1/10 pain at most and this happens with STS while transferring in and out of car.  Progressed with closed chain exercises today. She did well with STS progression with resistance. Did have mild pain at TFL area during step ups. Updated HEP with closed chain exercises  today. Medbridge app down so unable to print the HEP. She has made improvement however might benefit from an extended POC to contnued closed chain progression and finalize advanced HEP prior to DC. She will see primary PT next visit to be assessed.     OBJECTIVE IMPAIRMENTS decreased mobility, difficulty walking, decreased ROM, hypomobility, increased fascial restrictions, impaired flexibility, obesity, and pain.    ACTIVITY LIMITATIONS sitting, standing, squatting, sleeping, transfers, and locomotion level   PARTICIPATION LIMITATIONS: shopping, community activity, and fitness   PERSONAL FACTORS 1 comorbidity: back pain   are also affecting patient's functional outcome.    REHAB POTENTIAL: Excellent   CLINICAL DECISION MAKING: Stable/uncomplicated   EVALUATION COMPLEXITY: Low     GOALS: Goals reviewed with patient? Yes FOTO score will improve to 79% Baseline: 72% Status  72% 6th visit Goal status: ongoing    2.  PT will be able to sleep on her Rt side without increased pain in Rt hip.  Baseline: 06/30/22: better but last night was an issue Goal status: ongoing   3.  Pt will be able to sit with her Rt leg crossed over Lt. To don footwear.  Baseline: can do this now but still limited , L knee pain and increased effort Goal status: ongoing   4.  Pt will be able to complete HEP and lower body workout without increased pain .  Baseline:  Goal status: ongoing    5.  Pt will be able to walk in the community as needed without increased hip pain .  Baseline: was at [facility] a couple of weeks ago, improved Goal status: ongoing    PLAN: PT FREQUENCY: 2x/week   PT DURATION: 6 weeks   PLANNED INTERVENTIONS: Therapeutic exercises, Therapeutic activity, Neuromuscular re-education, Balance training, Gait training, Patient/Family education, Self Care, Joint mobilization, Dry Needling, Spinal  mobilization, Cryotherapy, Moist heat, Taping, Ionotophoresis '4mg'$ /ml Dexamethasone, Manual therapy,  and Re-evaluation   PLAN FOR NEXT SESSION: check HEP. Manual QL, Glute , DN hip mobility , STS progression  Hessie Diener, PTA 07/11/22 8:15 AM Phone: [phone] Fax: [phone]

## 2022-07-12 NOTE — Therapy (Signed)
OUTPATIENT PHYSICAL THERAPY TREATMENT NOTE   Patient Name: Vanessa Byrd MRN: [id number] DOB:September 05, 1975, 46 y.o., female Today's Date: 07/13/2022  PCP: Pricilla Holm, MD  REFERRING PROVIDER: Pricilla Holm, MD   END OF SESSION:   PT End of Session - 07/13/22 1023     Visit Number 8    Number of Visits 12    Date for PT Re-Evaluation 08/19/22    Authorization Type [employer] employee FOCUS plan    Progress Note Due on Visit 10    PT Start Time 0721    PT Stop Time 0805    PT Time Calculation (min) 44 min    Activity Tolerance Patient tolerated treatment well    Behavior During Therapy [facility] for tasks assessed/performed                Past Medical History:  Diagnosis Date   Colitis    Esophagitis    H. pylori infection    Sinusitis    Past Surgical History:  Procedure Laterality Date   TONSILLECTOMY     WISDOM TOOTH EXTRACTION     Patient Active Problem List   Diagnosis Date Noted   Greater trochanteric bursitis of right hip 03/03/2022   Pelvic pain 02/04/2022   Encounter for general adult medical examination with abnormal findings 02/04/2022   Other specified hypothyroidism 11/05/2021   Cyst, dermoid, scalp and neck 11/05/2021   Fibroid 11/05/2021   Morbid obesity ([place]) 11/05/2021   Left foot pain 04/23/2021   Other fatigue 04/23/2021   Pre-diabetes 04/23/2021   Allergic contact dermatitis due to plants, except food 02/12/2021   Chronic foot pain, right 05/21/2020   Iron deficiency 07/19/2018   Gastroesophageal reflux disease with esophagitis 06/13/2018   Right lower quadrant abdominal pain 12/12/2017   Anxiety and depression 05/31/2013   ALLERGIC RHINITIS 11/24/2009    REFERRING DIAG: Pain in right hip  THERAPY DIAG:  Pain in right hip  Stiffness of right hip, not elsewhere classified  Rationale for Evaluation and Treatment Rehabilitation  SUBJECTIVE:    SUBJECTIVE STATEMENT: Pt reports being sore after the last PT  session   PAIN:  Are you having pain? Yes: NPRS scale: 1/10 Pain location: Rt hip.   Pain description: tightness, Aggravating factors: lying on Rt side. , sit to stand Relieving factors: changing position, heat/ice, hip,flexor stretch   Pian range: 2-5/10/10   PRECAUTIONS: None   WEIGHT BEARING RESTRICTIONS No   FALLS:  Has patient fallen in last 6 months? No   LIVING ENVIRONMENT: Lives with: lives with their spouse Lives in: House/apartment Stairs: no diff.  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    PLOF: Independent. Has a son in college .    PATIENT GOALS I want to be able to move without pain, understand what the problem.      OBJECTIVE: (objective measures completed at initial evaluation unless otherwise dated)   DIAGNOSTIC FINDINGS: neg CT scan    PATIENT SURVEYS:  FOTO 72% FOTO  72%   COGNITION:           Overall cognitive status: Within functional limits for tasks assessed                          SENSATION: WFL   EDEMA:  None    MUSCLE LENGTH: Hamstrings: Right tight  deg; Left less tight deg Thomas test: Right normal  deg; Left normal, tight L TFL  deg   POSTURE: No Significant postural limitations   PALPATION: TTP along Rt TFL but mostly Rt glute medius, minimus and beside L5, spasm and pain in Rt quadratus lumborum.    LOWER EXTREMITY ROM:   Active ROM Right eval Left eval  Hip flexion 100 deg  100 deg   Hip extension      Hip abduction      Hip adduction      Hip internal rotation [facility]  Hip external rotation Tight, approx. 30 deg  Tight ,approx. 40 deg  Knee flexion      Knee extension      Ankle dorsiflexion      Ankle plantarflexion      Ankle inversion      Ankle eversion       (Blank rows = not tested)   LOWER EXTREMITY MMT:   MMT Right eval Left eval  Hip flexion 5/5 5/5  Hip extension 4+/5 4+/5  Hip abduction 4+/5 5/5  Hip adduction      Hip internal rotation      Hip  external rotation      Knee flexion 5/5 5/5  Knee extension 5/5 5/5  Ankle dorsiflexion      Ankle plantarflexion      Ankle inversion      Ankle eversion       (Blank rows = not tested)   LOWER EXTREMITY SPECIAL TESTS:  Hip special tests: Saralyn Pilar (FABER) test: positive , Thomas test: negative, and Hip scouring test: negative   FUNCTIONAL TESTS:  NT  SLS WNL  Squats without pain in Rt hip, has knee discomfort    GAIT: Distance walked: 150 Assistive device utilized: None Level of assistance: Complete Independence Comments: no deviations        TODAY'S TREATMENT: [facility] Adult PT Treatment:                                                DATE: 07/13/22 Therapeutic Exercise: Half kneeling c trunk rotation to the L stretch x3 20 SL hip add 2x15 SL hip abd c IR 2x15 HL hip add sets x15 5" Hip clams x15 10sec BluTB STS x15 10# Manual Therapy: STM and DTM to R TFL  Skilled palpation to identify TrPs and taut muscle bands  Trigger Point Dry Needling Treatment: Pre-treatment instruction: Patient instructed on dry needling rationale, procedures, and possible side effects including pain during treatment (achy,cramping feeling), bruising, drop of blood, lightheadedness, nausea, sweating. Patient Consent Given: Yes Education handout provided: Yes Muscles treated: R TFL Needle size and number .44m x.30 1 needle Electrical stimulation performed: No Parameters: N/A Treatment response/outcome: Twitch response elicited Post-treatment instructions: Patient instructed to expect possible mild to moderate muscle soreness later today and/or tomorrow. Patient instructed in methods to reduce muscle soreness and to continue prescribed HEP. If patient was dry needled over the lung field, patient was instructed on signs and symptoms of pneumothorax and, however unlikely, to see immediate medical attention should they occur. Patient was also educated on signs and symptoms of infection and to seek  medical attention should they occur. Patient verbalized understanding of these instructions and education.   [facility]  [facility]: 07/11/22 Therapeutic Exercise: Rec Bike L3 x 5 minutes  STS x 10, standard chair 10# x10 with 15#  Standing quad/hip flexor stretch 2 x 20 sec each  Green band side step squats 46f x 3 each  way Supine Bridge with blue band  S/L blue band clams - cues for positioning  Verbally updated HEP- medbridge down- added STS and side squats green band   [facility] Adult PT Treatment:                                                DATE: 07/08/22 Therapeutic Exercise: STS x 10 , bariatric chair Standing quad/hip flexor stretch 2 x 20 sec each  Supine wide knee LTR x 10 Figure 4 with LTR Supine Bridge with blue band  Hamstring  stretch passive  Piriformis and figure 4 stretches Banded bridge 2x10 Hip clams x15 10sec BluTB Manual Therapy: Manual axial distraction of the R hip/LE A/P mobs to Right Hip  PATIENT EDUCATION:  Education details: HEP outer hip, tennis ball, Dry needling  Person educated: Patient Education method: EConsulting civil engineer Demonstration, Verbal cues, and Handouts Education comprehension: verbalized understanding and needs further education     HOME EXERCISE PROGRAM: Access Code: [email or web address] URL: [email or web address] Date: 06/10/2022 Prepared by: AGar Ponto Exercises - Supine Bilateral Hip Internal Rotation Stretch  - 2 x daily - 7 x weekly - 2 sets - 10 reps - 5-10 hold - Pigeon Pose  - 2 x daily - 7 x weekly - 1 sets - 3 reps - 30 hold - Clamshell with Resistance  - 1-2 x daily - 7 x weekly - 2 sets - 10 reps - 0-5 hold - Supine Piriformis Stretch with Foot on Ground  - 1 x daily - 7 x weekly - 1 sets - 3 reps - 20 hold - Supine Figure 4 Piriformis Stretch  - 1 x daily - 7 x weekly - 1 sets - 3 reps - 20 hold - Standing ITB Stretch  - 1 x daily - 7 x weekly - 1 sets - 3 reps -  20 hold - Supine Bridge  - 1 x daily - 7 x weekly - 1 sets - 10 reps - 10 hold - Sidelying Hip Abduction  - 1 x daily - 7 x weekly - 2 sets - 10 reps - 3 hold - Supine Hip Adduction Isometric with Ball  - 1 x daily - 7 x weekly - 1 sets - 10 reps - 5 hold   ASSESSMENT:   CLINICAL IMPRESSION: Pt continues to report a1/10 pain at with STS and transferring in and out of car. Today PT was completed for STM/DTM to the R TFL f/d TPDN to TrPs of this muscle. Muscle twitches were palpated. Pt then completed therex for pelvis/hip strengthening and flexibility. Pt is responding appropriately to PT with decreased pain and increased ROM of the R hip. Pt tolerated PT today without adverse effects. Assess reponse to TPDN treatment to the R TLF the nexy PT session. Pt will continue to benefit from skilled PT 1w4 to address impairments for improved function for the R hip/LE with less pain.   OBJECTIVE IMPAIRMENTS decreased mobility, difficulty walking, decreased ROM, hypomobility, increased fascial restrictions, impaired flexibility, obesity, and pain.    ACTIVITY LIMITATIONS sitting, standing, squatting, sleeping, transfers, and locomotion level   PARTICIPATION LIMITATIONS: shopping,  community activity, and fitness   PERSONAL FACTORS 1 comorbidity: back pain   are also affecting patient's functional outcome.    REHAB POTENTIAL: Excellent   CLINICAL DECISION MAKING: Stable/uncomplicated   EVALUATION COMPLEXITY: Low     GOALS: 08/19/22 Goals reviewed with patient? Yes FOTO score will improve to 79% Baseline: 72% Status  72% 6th visit Goal status: ongoing    2.  PT will be able to sleep on her Rt side without increased pain in Rt hip.  Baseline: 06/30/22: better but last night was an issue Goal status: ongoing   3.  Pt will be able to sit with her Rt leg crossed over Lt. To don footwear.  Baseline: can do this now but still limited , L knee pain and increased effort Goal status: ongoing   4.  Pt  will be able to complete HEP and lower body workout without increased pain .  Baseline:  Goal status: ongoing    5.  Pt will be able to walk in the community as needed without increased hip pain .  Baseline: was at [facility] a couple of weeks ago, improved Goal status: ongoing    PLAN: PT FREQUENCY: 2x/week   PT DURATION: 6 weeks   PLANNED INTERVENTIONS: Therapeutic exercises, Therapeutic activity, Neuromuscular re-education, Balance training, Gait training, Patient/Family education, Self Care, Joint mobilization, Dry Needling, Spinal mobilization, Cryotherapy, Moist heat, Taping, Ionotophoresis '4mg'$ /ml Dexamethasone, Manual therapy, and Re-evaluation   PLAN FOR NEXT SESSION: check HEP. Manual QL, Glute , DN hip mobility , STS progression   Pernell Dikes MS, PT 07/13/22 10:39 AM

## 2022-07-13 ENCOUNTER — Ambulatory Visit: Payer: No Typology Code available for payment source

## 2022-07-13 DIAGNOSIS — M25551 Pain in right hip: Secondary | ICD-10-CM

## 2022-07-13 DIAGNOSIS — M25651 Stiffness of right hip, not elsewhere classified: Secondary | ICD-10-CM

## 2022-07-19 ENCOUNTER — Ambulatory Visit: Payer: No Typology Code available for payment source | Admitting: Physical Therapy

## 2022-07-26 NOTE — Therapy (Signed)
OUTPATIENT PHYSICAL THERAPY TREATMENT NOTE   Patient Name: Vanessa Byrd MRN: [id number] DOB:Sep 27, 1975, 46 y.o., female Today's Date: 07/27/2022  PCP: Pricilla Holm, MD  REFERRING PROVIDER: Pricilla Holm, MD   END OF SESSION:   PT End of Session - 07/27/22 0859     Visit Number 9    Number of Visits 12    Date for PT Re-Evaluation 08/19/22    Authorization Type [employer] employee FOCUS plan    Progress Note Due on Visit 10    PT Start Time 0807    PT Stop Time 0850    PT Time Calculation (min) 43 min    Activity Tolerance Patient tolerated treatment well    Behavior During Therapy [facility] for tasks assessed/performed                Past Medical History:  Diagnosis Date   Colitis    Esophagitis    H. pylori infection    Sinusitis    Past Surgical History:  Procedure Laterality Date   TONSILLECTOMY     WISDOM TOOTH EXTRACTION     Patient Active Problem List   Diagnosis Date Noted   Greater trochanteric bursitis of right hip 03/03/2022   Pelvic pain 02/04/2022   Encounter for general adult medical examination with abnormal findings 02/04/2022   Other specified hypothyroidism 11/05/2021   Cyst, dermoid, scalp and neck 11/05/2021   Fibroid 11/05/2021   Morbid obesity ([place]) 11/05/2021   Left foot pain 04/23/2021   Other fatigue 04/23/2021   Pre-diabetes 04/23/2021   Allergic contact dermatitis due to plants, except food 02/12/2021   Chronic foot pain, right 05/21/2020   Iron deficiency 07/19/2018   Gastroesophageal reflux disease with esophagitis 06/13/2018   Right lower quadrant abdominal pain 12/12/2017   Anxiety and depression 05/31/2013   ALLERGIC RHINITIS 11/24/2009    REFERRING DIAG: Pain in right hip  THERAPY DIAG:  Pain in right hip  Stiffness of right hip, not elsewhere classified  Rationale for Evaluation and Treatment Rehabilitation  SUBJECTIVE:    SUBJECTIVE STATEMENT: Pt reports her R ant/lat was more sore/painful  after the last PT session with TPDN to this area. Pt reports the degree of pain was similar to what it was when she started PT, but has gradually decreased to 2/10. She notes the area is TTP.  PAIN:  Are you having pain? Yes: NPRS scale: 2/10 Pain location: Rt hip.   Pain description: tightness, Aggravating factors: lying on Rt side. , sit to stand Relieving factors: changing position, heat/ice, hip,flexor stretch   Pain range: 2-5/10/10   PRECAUTIONS: None   WEIGHT BEARING RESTRICTIONS No   FALLS:  Has patient fallen in last 6 months? No   LIVING ENVIRONMENT: Lives with: lives with their spouse Lives in: House/apartment Stairs: no diff.  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    PLOF: Independent. Has a son in college .    PATIENT GOALS I want to be able to move without pain, understand what the problem.      OBJECTIVE: (objective measures completed at initial evaluation unless otherwise dated)   DIAGNOSTIC FINDINGS: neg CT scan    PATIENT SURVEYS:  FOTO 72% FOTO  72%   COGNITION:           Overall cognitive status: Within functional limits for tasks assessed  SENSATION: WFL   EDEMA:  None    MUSCLE LENGTH: Hamstrings: Right tight  deg; Left less tight deg Thomas test: Right normal  deg; Left normal, tight L TFL  deg   POSTURE: No Significant postural limitations   PALPATION: TTP along Rt TFL but mostly Rt glute medius, minimus and beside L5, spasm and pain in Rt quadratus lumborum.    LOWER EXTREMITY ROM:   Active ROM Right eval Left eval RT 07/27/22  Hip flexion 100 deg  100 deg    Hip extension       Hip abduction       Hip adduction       Hip internal rotation [facility] WFL   Hip external rotation Tight, approx. 30 deg  Tight ,approx. 40 deg 43d  Knee flexion       Knee extension       Ankle dorsiflexion       Ankle plantarflexion       Ankle inversion       Ankle eversion         (Blank rows = not tested)   LOWER EXTREMITY MMT:   MMT Right eval Left eval  Hip flexion 5/5 5/5  Hip extension 4+/5 4+/5  Hip abduction 4+/5 5/5  Hip adduction      Hip internal rotation      Hip external rotation      Knee flexion 5/5 5/5  Knee extension 5/5 5/5  Ankle dorsiflexion      Ankle plantarflexion      Ankle inversion      Ankle eversion       (Blank rows = not tested)   LOWER EXTREMITY SPECIAL TESTS:  Hip special tests: Saralyn Pilar (FABER) test: positive , Thomas test: negative, and Hip scouring test: negative   FUNCTIONAL TESTS:  NT  SLS WNL  Squats without pain in Rt hip, has knee discomfort    GAIT: Distance walked: 150 Assistive device utilized: None Level of assistance: Complete Independence Comments: no deviations        TODAY'S TREATMENT: [facility] Adult PT Treatment:                                                DATE: 07/27/22 Therapeutic Exercise: Half kneeling c trunk rotation to the L stretch x3 20" Pigeon stretch x3 20" Piriformis stretch x3 20" Figure 4 stretch x2 20" SLR c hip IR x15 SL hip abd c IR x15 Lateral steps 4" x15 Updated HEP to complete hip abd in IR Manual Therapy: STM to the R TFL   [facility] Adult PT Treatment:                                                DATE: 07/13/22 Therapeutic Exercise: Half kneeling c trunk rotation to the L stretch x3 20 SL hip add 2x15 SL hip abd c IR 2x15 HL hip add sets x15 5" Hip clams x15 10sec BluTB STS x15 10# Manual Therapy: STM and DTM to R TFL  Skilled palpation to identify TrPs and taut muscle bands  Trigger Point Dry Needling Treatment: Pre-treatment instruction: Patient instructed on dry needling rationale, procedures, and possible side effects including pain during treatment (achy,cramping feeling),  bruising, drop of blood, lightheadedness, nausea, sweating. Patient Consent Given: Yes Education handout provided: Yes Muscles treated: R TFL Needle size and number .75m x.30 1  needle Electrical stimulation performed: No Parameters: N/A Treatment response/outcome: Twitch response elicited Post-treatment instructions: Patient instructed to expect possible mild to moderate muscle soreness later today and/or tomorrow. Patient instructed in methods to reduce muscle soreness and to continue prescribed HEP. If patient was dry needled over the lung field, patient was instructed on signs and symptoms of pneumothorax and, however unlikely, to see immediate medical attention should they occur. Patient was also educated on signs and symptoms of infection and to seek medical attention should they occur. Patient verbalized understanding of these instructions and education.   [facility]                                                DATE: 07/[facility]/22 Therapeutic Exercise: Rec Bike L3 x 5 minutes  STS x 10, standard chair 10# x10 with 15#  Standing quad/hip flexor stretch 2 x 20 sec each  Green band side step squats 174fx 3 each  way Supine Bridge with blue band  S/L blue band clams - cues for positioning  Verbally updated HEP- medbridge down- added STS and side squats green band   [facility] Adult PT Treatment:                                                DATE: 07/08/22 Therapeutic Exercise: STS x 10 , bariatric chair Standing quad/hip flexor stretch 2 x 20 sec each  Supine wide knee LTR x 10 Figure 4 with LTR Supine Bridge with blue band  Hamstring  stretch passive  Piriformis and figure 4 stretches Banded bridge 2x10 Hip clams x15 10sec BluTB Manual Therapy: Manual axial distraction of the R hip/LE A/P mobs to Right Hip  PATIENT EDUCATION:  Education details: HEP outer hip, tennis ball, Dry needling  Person educated: Patient Education method: ExConsulting civil engineerDemonstration, Verbal cues, and Handouts Education comprehension: verbalized understanding and needs further education     HOME EXERCISE PROGRAM: Access Code: [email or web address] URL:  [email or web address] Date: 06/10/2022 Prepared by: AlGar PontoExercises - Supine Bilateral Hip Internal Rotation Stretch  - 2 x daily - 7 x weekly - 2 sets - 10 reps - 5-10 hold - Pigeon Pose  - 2 x daily - 7 x weekly - 1 sets - 3 reps - 30 hold - Clamshell with Resistance  - 1-2 x daily - 7 x weekly - 2 sets - 10 reps - 0-5 hold - Supine Piriformis Stretch with Foot on Ground  - 1 x daily - 7 x weekly - 1 sets - 3 reps - 20 hold - Supine Figure 4 Piriformis Stretch  - 1 x daily - 7 x weekly - 1 sets - 3 reps - 20 hold - Standing ITB Stretch  - 1 x daily - 7 x weekly - 1 sets - 3 reps - 20 hold - Supine Bridge  - 1 x daily - 7 x weekly - 1 sets - 10 reps - 10 hold - Sidelying Hip Abduction in IR - 1 x daily - 7 x weekly - 2 sets - 10  reps - 3 hold - Supine Hip Adduction Isometric with Ball  - 1 x daily - 7 x weekly - 1 sets - 10 reps - 5 hold   ASSESSMENT:   CLINICAL IMPRESSION: PT was completed for STM to the TFL with areas of point tenderness identified. Therex was then completed for flexibility and strengthening of R hip musculature with emphasis on the TFL. Pt voiced understanding and returned demonstration of completing hip abd in IR. Following the PT session, pt reported no change in her R hip pain with it remaining at a 2/10. Overall, pt's R hip pain has returned to being better since the start of PT after an increase in the R ant/lat hip pain following the last PT session with TPDN to the TFL. Pt's R hip ER ROM has improved from 30d to 43d and she reports greater ability to don/doff socks and shoes. Pt will continue to benefit from skilled PT to address impairments for improved function.   OBJECTIVE IMPAIRMENTS decreased mobility, difficulty walking, decreased ROM, hypomobility, increased fascial restrictions, impaired flexibility, obesity, and pain.    ACTIVITY LIMITATIONS sitting, standing, squatting, sleeping, transfers, and locomotion level   PARTICIPATION  LIMITATIONS: shopping, community activity, and fitness   PERSONAL FACTORS 1 comorbidity: back pain   are also affecting patient's functional outcome.    REHAB POTENTIAL: Excellent   CLINICAL DECISION MAKING: Stable/uncomplicated   EVALUATION COMPLEXITY: Low     GOALS: 08/19/22 Goals reviewed with patient? Yes FOTO score will improve to 79% Baseline: 72% Status  72% 6th visit Goal status: ongoing    2.  PT will be able to sleep on her Rt side without increased pain in Rt hip.  Baseline: 06/30/22: better but last night was an issue Goal status: ongoing   3.  Pt will be able to sit with her Rt leg crossed over Lt. To don footwear.  Baseline: can do this now but still limited , L knee pain and increased effort Status: 07/27/22, R hip ER= 43d Goal status: Improved   4.  Pt will be able to complete HEP and lower body workout without increased pain .  Baseline:  Goal status: ongoing    5.  Pt will be able to walk in the community as needed without increased hip pain .  Baseline: was at [facility] a couple of weeks ago, improved Goal status: ongoing    PLAN: PT FREQUENCY: 2x/week   PT DURATION: 6 weeks   PLANNED INTERVENTIONS: Therapeutic exercises, Therapeutic activity, Neuromuscular re-education, Balance training, Gait training, Patient/Family education, Self Care, Joint mobilization, Dry Needling, Spinal mobilization, Cryotherapy, Moist heat, Taping, Ionotophoresis '4mg'$ /ml Dexamethasone, Manual therapy. Assess R hip strength.   PLAN FOR NEXT SESSION: check HEP. Manual QL, Glute , DN hip mobility , STS progression. Assess R hip strength.   Sharniece Gibbon MS, PT 07/27/22 9:29 AM

## 2022-07-27 ENCOUNTER — Ambulatory Visit: Payer: No Typology Code available for payment source

## 2022-07-27 DIAGNOSIS — M25651 Stiffness of right hip, not elsewhere classified: Secondary | ICD-10-CM

## 2022-07-27 DIAGNOSIS — M25551 Pain in right hip: Secondary | ICD-10-CM

## 2022-08-04 NOTE — Therapy (Incomplete)
OUTPATIENT PHYSICAL THERAPY TREATMENT NOTE   Patient Name: Vanessa Byrd MRN: [id number] DOB:Nov 28, 1975, 46 y.o., female Today's Date: 07/27/2022  PCP: Pricilla Holm, MD  REFERRING PROVIDER: Pricilla Holm, MD   END OF SESSION:   PT End of Session - 07/27/22 0859     Visit Number 9    Number of Visits 12    Date for PT Re-Evaluation 08/19/22    Authorization Type [employer] employee FOCUS plan    Progress Note Due on Visit 10    PT Start Time 0807    PT Stop Time 0850    PT Time Calculation (min) 43 min    Activity Tolerance Patient tolerated treatment well    Behavior During Therapy [facility] for tasks assessed/performed                Past Medical History:  Diagnosis Date   Colitis    Esophagitis    H. pylori infection    Sinusitis    Past Surgical History:  Procedure Laterality Date   TONSILLECTOMY     WISDOM TOOTH EXTRACTION     Patient Active Problem List   Diagnosis Date Noted   Greater trochanteric bursitis of right hip 03/03/2022   Pelvic pain 02/04/2022   Encounter for general adult medical examination with abnormal findings 02/04/2022   Other specified hypothyroidism 11/05/2021   Cyst, dermoid, scalp and neck 11/05/2021   Fibroid 11/05/2021   Morbid obesity ([place]) 11/05/2021   Left foot pain 04/23/2021   Other fatigue 04/23/2021   Pre-diabetes 04/23/2021   Allergic contact dermatitis due to plants, except food 02/12/2021   Chronic foot pain, right 05/21/2020   Iron deficiency 07/19/2018   Gastroesophageal reflux disease with esophagitis 06/13/2018   Right lower quadrant abdominal pain 12/12/2017   Anxiety and depression 05/31/2013   ALLERGIC RHINITIS 11/24/2009    REFERRING DIAG: Pain in right hip  THERAPY DIAG:  Pain in right hip  Stiffness of right hip, not elsewhere classified  Rationale for Evaluation and Treatment Rehabilitation  SUBJECTIVE:    SUBJECTIVE STATEMENT: Pt reports her R ant/lat was more sore/painful  after the last PT session with TPDN to this area. Pt reports the degree of pain was similar to what it was when she started PT, but has gradually decreased to 2/10. She notes the area is TTP.  PAIN:  Are you having pain? Yes: NPRS scale: 2/10 Pain location: Rt hip.   Pain description: tightness, Aggravating factors: lying on Rt side. , sit to stand Relieving factors: changing position, heat/ice, hip,flexor stretch   Pain range: 2-5/10/10   PRECAUTIONS: None   WEIGHT BEARING RESTRICTIONS No   FALLS:  Has patient fallen in last 6 months? No   LIVING ENVIRONMENT: Lives with: lives with their spouse Lives in: House/apartment Stairs: no diff.  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    PLOF: Independent. Has a son in college .    PATIENT GOALS I want to be able to move without pain, understand what the problem.      OBJECTIVE: (objective measures completed at initial evaluation unless otherwise dated)   DIAGNOSTIC FINDINGS: neg CT scan    PATIENT SURVEYS:  FOTO 72% FOTO  72%   COGNITION:           Overall cognitive status: Within functional limits for tasks assessed  SENSATION: WFL   EDEMA:  None    MUSCLE LENGTH: Hamstrings: Right tight  deg; Left less tight deg Thomas test: Right normal  deg; Left normal, tight L TFL  deg   POSTURE: No Significant postural limitations   PALPATION: TTP along Rt TFL but mostly Rt glute medius, minimus and beside L5, spasm and pain in Rt quadratus lumborum.    LOWER EXTREMITY ROM:   Active ROM Right eval Left eval RT 07/27/22  Hip flexion 100 deg  100 deg    Hip extension       Hip abduction       Hip adduction       Hip internal rotation [facility] WFL   Hip external rotation Tight, approx. 30 deg  Tight ,approx. 40 deg 43d  Knee flexion       Knee extension       Ankle dorsiflexion       Ankle plantarflexion       Ankle inversion       Ankle eversion         (Blank rows = not tested)   LOWER EXTREMITY MMT:   MMT Right eval Left eval  Hip flexion 5/5 5/5  Hip extension 4+/5 4+/5  Hip abduction 4+/5 5/5  Hip adduction      Hip internal rotation      Hip external rotation      Knee flexion 5/5 5/5  Knee extension 5/5 5/5  Ankle dorsiflexion      Ankle plantarflexion      Ankle inversion      Ankle eversion       (Blank rows = not tested)   LOWER EXTREMITY SPECIAL TESTS:  Hip special tests: Saralyn Pilar (FABER) test: positive , Thomas test: negative, and Hip scouring test: negative   FUNCTIONAL TESTS:  NT  SLS WNL  Squats without pain in Rt hip, has knee discomfort    GAIT: Distance walked: 150 Assistive device utilized: None Level of assistance: Complete Independence Comments: no deviations        TODAY'S TREATMENT: [facility] Adult PT Treatment:                                                DATE: 08/05/22 Therapeutic Exercise: *** Manual Therapy: *** Neuromuscular re-ed: *** Therapeutic Activity: *** Modalities: *** Self Care: ***  [facility] Adult PT Treatment:                                                DATE: 07/27/22 Therapeutic Exercise: Half kneeling c trunk rotation to the L stretch x3 20" Pigeon stretch x3 20" Piriformis stretch x3 20" Figure 4 stretch x2 20" SLR c hip IR x15 SL hip abd c IR x15 Lateral steps 4" x15 Updated HEP to complete hip abd in IR Manual Therapy: STM to the R TFL   [facility] Adult PT Treatment:                                                DATE: 07/13/22 Therapeutic Exercise: Half kneeling c trunk rotation to the  L stretch x3 20 SL hip add 2x15 SL hip abd c IR 2x15 HL hip add sets x15 5" Hip clams x15 10sec BluTB STS x15 10# Manual Therapy: STM and DTM to R TFL  Skilled palpation to identify TrPs and taut muscle bands  Trigger Point Dry Needling Treatment: Pre-treatment instruction: Patient instructed on dry needling rationale, procedures, and possible side effects including pain  during treatment (achy,cramping feeling), bruising, drop of blood, lightheadedness, nausea, sweating. Patient Consent Given: Yes Education handout provided: Yes Muscles treated: R TFL Needle size and number .83m x.30 1 needle Electrical stimulation performed: No Parameters: N/A Treatment response/outcome: Twitch response elicited Post-treatment instructions: Patient instructed to expect possible mild to moderate muscle soreness later today and/or tomorrow. Patient instructed in methods to reduce muscle soreness and to continue prescribed HEP. If patient was dry needled over the lung field, patient was instructed on signs and symptoms of pneumothorax and, however unlikely, to see immediate medical attention should they occur. Patient was also educated on signs and symptoms of infection and to seek medical attention should they occur. Patient verbalized understanding of these instructions and education.   [facility]:                                                DATE: 07/11/22 Therapeutic Exercise: Rec Bike L3 x 5 minutes  STS x 10, standard chair 10# x10 with 15#  Standing quad/hip flexor stretch 2 x 20 sec each  Green band side step squats 174fx 3 each  way Supine Bridge with blue band  S/L blue band clams - cues for positioning  Verbally updated HEP- medbridge down- added STS and side squats green band   [facility] Adult PT Treatment:                                                DATE: 07/08/22 Therapeutic Exercise: STS x 10 , bariatric chair Standing quad/hip flexor stretch 2 x 20 sec each  Supine wide knee LTR x 10 Figure 4 with LTR Supine Bridge with blue band  Hamstring  stretch passive  Piriformis and figure 4 stretches Banded bridge 2x10 Hip clams x15 10sec BluTB Manual Therapy: Manual axial distraction of the R hip/LE A/P mobs to Right Hip  PATIENT EDUCATION:  Education details: HEP outer hip, tennis ball, Dry needling  Person educated: Patient Education  method: ExConsulting civil engineerDemonstration, Verbal cues, and Handouts Education comprehension: verbalized understanding and needs further education     HOME EXERCISE PROGRAM: Access Code: [email or web address] URL: [email or web address] Date: 06/10/2022 Prepared by: AlGar PontoExercises - Supine Bilateral Hip Internal Rotation Stretch  - 2 x daily - 7 x weekly - 2 sets - 10 reps - 5-10 hold - Pigeon Pose  - 2 x daily - 7 x weekly - 1 sets - 3 reps - 30 hold - Clamshell with Resistance  - 1-2 x daily - 7 x weekly - 2 sets - 10 reps - 0-5 hold - Supine Piriformis Stretch with Foot on Ground  - 1 x daily - 7 x weekly - 1 sets - 3 reps - 20 hold - Supine Figure 4 Piriformis Stretch  - 1 x daily - 7 x weekly -  1 sets - 3 reps - 20 hold - Standing ITB Stretch  - 1 x daily - 7 x weekly - 1 sets - 3 reps - 20 hold - Supine Bridge  - 1 x daily - 7 x weekly - 1 sets - 10 reps - 10 hold - Sidelying Hip Abduction in IR - 1 x daily - 7 x weekly - 2 sets - 10 reps - 3 hold - Supine Hip Adduction Isometric with Ball  - 1 x daily - 7 x weekly - 1 sets - 10 reps - 5 hold   ASSESSMENT:   CLINICAL IMPRESSION: PT was completed for STM to the TFL with areas of point tenderness identified. Therex was then completed for flexibility and strengthening of R hip musculature with emphasis on the TFL. Pt voiced understanding and returned demonstration of completing hip abd in IR. Following the PT session, pt reported no change in her R hip pain with it remaining at a 2/10. Overall, pt's R hip pain has returned to being better since the start of PT after an increase in the R ant/lat hip pain following the last PT session with TPDN to the TFL. Pt's R hip ER ROM has improved from 30d to 43d and she reports greater ability to don/doff socks and shoes. Pt will continue to benefit from skilled PT to address impairments for improved function.   OBJECTIVE IMPAIRMENTS decreased mobility, difficulty walking, decreased ROM,  hypomobility, increased fascial restrictions, impaired flexibility, obesity, and pain.    ACTIVITY LIMITATIONS sitting, standing, squatting, sleeping, transfers, and locomotion level   PARTICIPATION LIMITATIONS: shopping, community activity, and fitness   PERSONAL FACTORS 1 comorbidity: back pain   are also affecting patient's functional outcome.    REHAB POTENTIAL: Excellent   CLINICAL DECISION MAKING: Stable/uncomplicated   EVALUATION COMPLEXITY: Low     GOALS: 08/19/22 Goals reviewed with patient? Yes FOTO score will improve to 79% Baseline: 72% Status  72% 6th visit Goal status: ongoing    2.  PT will be able to sleep on her Rt side without increased pain in Rt hip.  Baseline: 06/30/22: better but last night was an issue Goal status: ongoing   3.  Pt will be able to sit with her Rt leg crossed over Lt. To don footwear.  Baseline: can do this now but still limited , L knee pain and increased effort Status: 07/27/22, R hip ER= 43d Goal status: Improved   4.  Pt will be able to complete HEP and lower body workout without increased pain .  Baseline:  Goal status: ongoing    5.  Pt will be able to walk in the community as needed without increased hip pain .  Baseline: was at [facility] a couple of weeks ago, improved Goal status: ongoing    PLAN: PT FREQUENCY: 2x/week   PT DURATION: 6 weeks   PLANNED INTERVENTIONS: Therapeutic exercises, Therapeutic activity, Neuromuscular re-education, Balance training, Gait training, Patient/Family education, Self Care, Joint mobilization, Dry Needling, Spinal mobilization, Cryotherapy, Moist heat, Taping, Ionotophoresis '4mg'$ /ml Dexamethasone, Manual therapy. Assess R hip strength.   PLAN FOR NEXT SESSION: check HEP. Manual QL, Glute , DN hip mobility , STS progression. Assess R hip strength.   Matheu Ploeger MS, PT 07/27/22 9:29 AM

## 2022-08-05 ENCOUNTER — Ambulatory Visit: Payer: No Typology Code available for payment source

## 2022-08-10 ENCOUNTER — Ambulatory Visit: Payer: No Typology Code available for payment source | Attending: Internal Medicine

## 2022-08-10 DIAGNOSIS — M25651 Stiffness of right hip, not elsewhere classified: Secondary | ICD-10-CM | POA: Diagnosis present

## 2022-08-10 DIAGNOSIS — M25551 Pain in right hip: Secondary | ICD-10-CM | POA: Diagnosis present

## 2022-08-10 NOTE — Therapy (Signed)
OUTPATIENT PHYSICAL THERAPY TREATMENT NOTE/Progress Note   Patient Name: Vanessa Byrd MRN: [id number] DOB:[birth year], 46 y.o., female Today's Date: 08/10/2022  PCP: Pricilla Holm, MD  REFERRING PROVIDER: Pricilla Holm, MD   Progress Note Reporting Period 06/01/22 to 08/10/22  See note below for Objective Data and Assessment of Progress/Goals.      END OF SESSION:   PT End of Session - 08/10/22 0845     Visit Number 10    Number of Visits 12    Date for PT Re-Evaluation 08/19/22    Authorization Type [employer] employee FOCUS plan    Progress Note Due on Visit 10    PT Start Time 0811    PT Stop Time 0845    PT Time Calculation (min) 34 min    Activity Tolerance Patient tolerated treatment well    Behavior During Therapy WFL for tasks assessed/performed                Past Medical History:  Diagnosis Date   Colitis    Esophagitis    H. pylori infection    Sinusitis    Past Surgical History:  Procedure Laterality Date   TONSILLECTOMY     WISDOM TOOTH EXTRACTION     Patient Active Problem List   Diagnosis Date Noted   Greater trochanteric bursitis of right hip 03/03/2022   Pelvic pain 02/04/2022   Encounter for general adult medical examination with abnormal findings 02/04/2022   Other specified hypothyroidism 11/05/2021   Cyst, dermoid, scalp and neck 11/05/2021   Fibroid 11/05/2021   Morbid obesity ([place]) 11/05/2021   Left foot pain 04/23/2021   Other fatigue 04/23/2021   Pre-diabetes 04/23/2021   Allergic contact dermatitis due to plants, except food 02/12/2021   Chronic foot pain, right 05/21/2020   Iron deficiency 07/19/2018   Gastroesophageal reflux disease with esophagitis 06/13/2018   Right lower quadrant abdominal pain 12/12/2017   Anxiety and depression 05/31/2013   ALLERGIC RHINITIS 11/24/2009    REFERRING DIAG: Pain in right hip  THERAPY DIAG:  Pain in right hip  Stiffness of right hip, not elsewhere  classified  Rationale for Evaluation and Treatment Rehabilitation  SUBJECTIVE:    SUBJECTIVE STATEMENT: Pt reports her R hip/gluteal pain was much better for 2 weeks, but after driving 2 hours each way on a one day trip, her R hip started bothering her more. Her hip bothers her more at time. Also, she has found it bothers her more when she wears a certain pair of shoes at work.  PAIN:  Are you having pain? Yes: NPRS scale: 3/10 Pain location: Rt hip.   Pain description: tightness, Aggravating factors: lying on Rt side. , sit to stand Relieving factors: changing position, heat/ice, hip,flexor stretch   Pain range: 2-5/10/10   PRECAUTIONS: None   WEIGHT BEARING RESTRICTIONS No   FALLS:  Has patient fallen in last 6 months? No   LIVING ENVIRONMENT: Lives with: lives with their spouse Lives in: House/apartment Stairs: no diff.  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    PLOF: Independent. Has a son in college .    PATIENT GOALS I want to be able to move without pain, understand what the problem.      OBJECTIVE: (objective measures completed at initial evaluation unless otherwise dated)   DIAGNOSTIC FINDINGS: neg CT scan    PATIENT SURVEYS:  FOTO 72% FOTO  72%   COGNITION:  Overall cognitive status: Within functional limits for tasks assessed                          SENSATION: WFL   EDEMA:  None    MUSCLE LENGTH: Hamstrings: Right tight  deg; Left less tight deg Thomas test: Right normal  deg; Left normal, tight L TFL  deg   POSTURE: No Significant postural limitations   PALPATION: TTP along Rt TFL but mostly Rt glute medius, minimus and beside L5, spasm and pain in Rt quadratus lumborum.    LOWER EXTREMITY ROM:   Active ROM Right eval Left eval RT 07/27/22  Hip flexion 100 deg  100 deg    Hip extension       Hip abduction       Hip adduction       Hip internal rotation [facility] WFL   Hip external rotation  Tight, approx. 30 deg  Tight ,approx. 40 deg 43d  Knee flexion       Knee extension       Ankle dorsiflexion       Ankle plantarflexion       Ankle inversion       Ankle eversion        (Blank rows = not tested)   LOWER EXTREMITY MMT:   MMT Right eval Left eval  Hip flexion 5/5 5/5  Hip extension 4+/5 4+/5  Hip abduction 4+/5 5/5  Hip adduction      Hip internal rotation      Hip external rotation      Knee flexion 5/5 5/5  Knee extension 5/5 5/5  Ankle dorsiflexion      Ankle plantarflexion      Ankle inversion      Ankle eversion       (Blank rows = not tested)   LOWER EXTREMITY SPECIAL TESTS:  Hip special tests: Saralyn Pilar (FABER) test: positive , Thomas test: negative, and Hip scouring test: negative   FUNCTIONAL TESTS:  NT  SLS WNL  Squats without pain in Rt hip, has knee discomfort    GAIT: Distance walked: 150 Assistive device utilized: None Level of assistance: Complete Independence Comments: no deviations        TODAY'S TREATMENT: [facility] Adult PT Treatment:                                                DATE: 08/05/22 Therapeutic Exercise: Piriformis stretch x3 20" ITB stretch x3 20" SLS R  Manual Therapy: STM c MTPR to the R piriformis and glut minimus Self Care: Instructed pt is sitting positions (Hips higher than knees, sleeping positions, proper shoe wear, and the use of heat or cold prior to sleeping to reduce and manage R hip pain  [facility] Adult PT Treatment:                                                DATE: 07/27/22 Therapeutic Exercise: Half kneeling c trunk rotation to the L stretch x3 20" Pigeon stretch x3 20" Piriformis stretch x3 20" Figure 4 stretch x2 20" SLR c hip IR x15 SL hip abd c IR x15 Lateral steps 4" x15 Updated HEP to complete  hip abd in IR Manual Therapy: STM to the R TFL   [facility] Adult PT Treatment:                                                DATE: 07/13/22 Therapeutic Exercise: Half kneeling c trunk rotation to the L  stretch x3 20 SL hip add 2x15 SL hip abd c IR 2x15 HL hip add sets x15 5" Hip clams x15 10sec BluTB STS x15 10# Manual Therapy: STM and DTM to R TFL  Skilled palpation to identify TrPs and taut muscle bands  Trigger Point Dry Needling Treatment: Pre-treatment instruction: Patient instructed on dry needling rationale, procedures, and possible side effects including pain during treatment (achy,cramping feeling), bruising, drop of blood, lightheadedness, nausea, sweating. Patient Consent Given: Yes Education handout provided: Yes Muscles treated: R TFL Needle size and number .11m x.30 1 needle Electrical stimulation performed: No Parameters: N/A Treatment response/outcome: Twitch response elicited Post-treatment instructions: Patient instructed to expect possible mild to moderate muscle soreness later today and/or tomorrow. Patient instructed in methods to reduce muscle soreness and to continue prescribed HEP. If patient was dry needled over the lung field, patient was instructed on signs and symptoms of pneumothorax and, however unlikely, to see immediate medical attention should they occur. Patient was also educated on signs and symptoms of infection and to seek medical attention should they occur. Patient verbalized understanding of these instructions and education.   [facility]                                                DATE: 07/11/22 Therapeutic Exercise: Rec Bike L3 x 5 minutes  STS x 10, standard chair 10# x10 with 15#  Standing quad/hip flexor stretch 2 x 20 sec each  Green band side step squats 12fx 3 each  way Supine Bridge with blue band  S/L blue band clams - cues for positioning  Verbally updated HEP- medbridge down- added STS and side squats green band   [facility] Adult PT Treatment:                                                DATE: 07/08/22 Therapeutic Exercise: STS x 10 , bariatric chair Standing quad/hip flexor stretch 2 x 20 sec each  Supine wide  knee LTR x 10 Figure 4 with LTR Supine Bridge with blue band  Hamstring  stretch passive  Piriformis and figure 4 stretches Banded bridge 2x10 Hip clams x15 10sec BluTB Manual Therapy: Manual axial distraction of the R hip/LE A/P mobs to Right Hip  PATIENT EDUCATION:  Education details: HEP outer hip, tennis ball, Dry needling  Person educated: Patient Education method: ExConsulting Byrd engineerDemonstration, Verbal cues, and Handouts Education comprehension: verbalized understanding and needs further education     HOME EXERCISE PROGRAM: Access Code: [email or web address] URL: [email or web address] Date: 06/10/2022 Prepared by: AlGar PontoExercises - Supine Bilateral Hip Internal Rotation Stretch  - 2 x daily - 7 x weekly - 2 sets - 10 reps - 5-10 hold - Pigeon Pose  - 2 x daily -  7 x weekly - 1 sets - 3 reps - 30 hold - Clamshell with Resistance  - 1-2 x daily - 7 x weekly - 2 sets - 10 reps - 0-5 hold - Supine Piriformis Stretch with Foot on Ground  - 1 x daily - 7 x weekly - 1 sets - 3 reps - 20 hold - Supine Figure 4 Piriformis Stretch  - 1 x daily - 7 x weekly - 1 sets - 3 reps - 20 hold - Standing ITB Stretch  - 1 x daily - 7 x weekly - 1 sets - 3 reps - 20 hold - Supine Bridge  - 1 x daily - 7 x weekly - 1 sets - 10 reps - 10 hold - Sidelying Hip Abduction in IR - 1 x daily - 7 x weekly - 2 sets - 10 reps - 3 hold - Supine Hip Adduction Isometric with Ball  - 1 x daily - 7 x weekly - 1 sets - 10 reps - 5 hold   ASSESSMENT:   CLINICAL IMPRESSION: Overall, pt's R hip pain and flexibility is improved. As noted in subjective, there are issues/circumstances that are problematic with aggravating pt's pain. Instruction in measures for symptom reduction and management was provided to pt. Pt voiced understanding. STM c MTPR was completed and followed by flexibility and muscle activation therex. Pt tolerated PT today without adverse effects. Pt will continue to benefit from skilled PT to  address impairments for improved function.     OBJECTIVE IMPAIRMENTS decreased mobility, difficulty walking, decreased ROM, hypomobility, increased fascial restrictions, impaired flexibility, obesity, and pain.    ACTIVITY LIMITATIONS sitting, standing, squatting, sleeping, transfers, and locomotion level   PARTICIPATION LIMITATIONS: shopping, community activity, and fitness   PERSONAL FACTORS 1 comorbidity: back pain   are also affecting patient's functional outcome.    REHAB POTENTIAL: Excellent   CLINICAL DECISION MAKING: Stable/uncomplicated   EVALUATION COMPLEXITY: Low     GOALS: 08/19/22 Goals reviewed with patient? Yes FOTO score will improve to 79% Baseline: 72% Status  72% 6th visit Goal status: ongoing    2.  PT will be able to sleep on her Rt side without increased pain in Rt hip.  Baseline: 06/30/22: better but last night was an issue Goal status: ongoing   3.  Pt will be able to sit with her Rt leg crossed over Lt. To don footwear.  Baseline: can do this now but still limited , L knee pain and increased effort Status: 07/27/22, R hip ER= 43d Goal status: Improved   4.  Pt will be able to complete HEP and lower body workout without increased pain .  Baseline:  Goal status: ongoing    5.  Pt will be able to walk in the community as needed without increased hip pain .  Baseline: was at [facility] a couple of weeks ago, improved Goal status: ongoing    PLAN: PT FREQUENCY: 2x/week   PT DURATION: 6 weeks   PLANNED INTERVENTIONS: Therapeutic exercises, Therapeutic activity, Neuromuscular re-education, Balance training, Gait training, Patient/Family education, Self Care, Joint mobilization, Dry Needling, Spinal mobilization, Cryotherapy, Moist heat, Taping, Ionotophoresis '4mg'$ /ml Dexamethasone, Manual therapy. Assess R hip strength.   PLAN FOR NEXT SESSION: check HEP. Manual QL, Glute , DN hip mobility , STS progression. Assess R hip strength. Consider TPDN for next  appt.   Aleera Gilcrease MS, PT 08/10/22 10:14 AM

## 2022-08-11 NOTE — Therapy (Signed)
OUTPATIENT PHYSICAL THERAPY TREATMENT NOTE/Progress Note   Patient Name: Vanessa Byrd MRN: [id number] DOB:September 24, 1975, 46 y.o., female Today's Date: 08/12/2022  PCP: Pricilla Holm, MD  REFERRING PROVIDER: Pricilla Holm, MD   Progress Note Reporting Period 06/01/22 to 08/10/22  See note below for Objective Data and Assessment of Progress/Goals.      END OF SESSION:   PT End of Session - 08/12/22 1055     Visit Number 11    Number of Visits 12    Date for PT Re-Evaluation 08/19/22    Authorization Type [employer] employee [employer]OC[employer] plan    Progress Note Due on Visit 20    PT Start Time 1026    PT Stop Time 1101    PT Time Calculation (min) 35 min    Activity Tolerance Patient tolerated treatment well    Behavior During Therapy WFL for tasks assessed/performed                Past Medical History:  Diagnosis Date   Colitis    Esophagitis    H. pylori infection    Sinusitis    Past Surgical History:  Procedure Laterality Date   TONSILLECTOMY     WISDOM TOOTH EXTRACTION     Patient Active Problem List   Diagnosis Date Noted   Greater trochanteric bursitis of right hip 03/03/2022   Pelvic pain 02/04/2022   Encounter for general adult medical examination with abnormal findings 02/04/2022   Other specified hypothyroidism 11/05/2021   Cyst, dermoid, scalp and neck 11/05/2021   Fibroid 11/05/2021   Morbid obesity ([place]) 11/05/2021   Left foot pain 04/23/2021   Other fatigue 04/23/2021   Pre-diabetes 04/23/2021   Allergic contact dermatitis due to plants, except food 02/12/2021   Chronic foot pain, right 05/21/2020   Iron deficiency 07/19/2018   Gastroesophageal reflux disease with esophagitis 06/13/2018   Right lower quadrant abdominal pain 12/12/2017   Anxiety and depression 05/31/2013   ALLERGIC RHINITIS 11/24/2009    REFERRING DIAG: Pain in right hip  THERAPY DIAG:  Pain in right hip  Stiffness of right hip, not elsewhere  classified  Rationale for Evaluation and Treatment Rehabilitation  SUBJECTIVE:    SUBJECTIVE STATEMENT: Pt reports her R hip/gluteal pian has been better. She is wearing her better shoes and has found a comfortable sleeping position a pillow or towel roll under her R iliac crest.  PAIN:  Are you having pain? Yes: NPRS scale: 1/10 Pain location: Rt hip.   Pain description: tightness, Aggravating factors: lying on Rt side. , sit to stand Relieving factors: changing position, heat/ice, hip,flexor stretch   Pain range: 0-1/10   PRECAUTIONS: None   WEIGHT BEARING RESTRICTIONS No   FALLS:  Has patient fallen in last 6 months? No   LIVING ENVIRONMENT: Lives with: lives with their spouse Lives in: House/apartment Stairs: no diff.  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    PLOF: Independent. Has a son in college .    PATIENT GOALS I want to be able to move without pain, understand what the problem.      OBJECTIVE: (objective measures completed at initial evaluation unless otherwise dated)   DIAGNOSTIC FINDINGS: neg CT scan    PATIENT SURVEYS:  FOTO 72% FOTO  72%   COGNITION:           Overall cognitive status: Within functional limits for tasks assessed  SENSATION: WFL   EDEMA:  None    MUSCLE LENGTH: Hamstrings: Right tight  deg; Left less tight deg Thomas test: Right normal  deg; Left normal, tight L TFL  deg   POSTURE: No Significant postural limitations   PALPATION: TTP along Rt TFL but mostly Rt glute medius, minimus and beside L5, spasm and pain in Rt quadratus lumborum.    LOWER EXTREMITY ROM:   Active ROM Right eval Left eval RT 07/27/22  Hip flexion 100 deg  100 deg    Hip extension       Hip abduction       Hip adduction       Hip internal rotation [facility] WFL   Hip external rotation Tight, approx. 30 deg  Tight ,approx. 40 deg 43d  Knee flexion       Knee extension       Ankle  dorsiflexion       Ankle plantarflexion       Ankle inversion       Ankle eversion        (Blank rows = not tested)   LOWER EXTREMITY MMT:   MMT Right eval Left eval  Hip flexion 5/5 5/5  Hip extension 4+/5 4+/5  Hip abduction 4+/5 5/5  Hip adduction      Hip internal rotation      Hip external rotation      Knee flexion 5/5 5/5  Knee extension 5/5 5/5  Ankle dorsiflexion      Ankle plantarflexion      Ankle inversion      Ankle eversion       (Blank rows = not tested)   LOWER EXTREMITY SPECIAL TESTS:  Hip special tests: Saralyn Pilar (FABER) test: positive , Thomas test: negative, and Hip scouring test: negative   FUNCTIONAL TESTS:  NT  SLS WNL  Squats without pain in Rt hip, has knee discomfort    GAIT: Distance walked: 150 Assistive device utilized: None Level of assistance: Complete Independence Comments: no deviations        TODAY'S TREATMENT: [facility] Adult PT Treatment:                                                DATE: 08/12/22 Therapeutic Exercise: Piriformis stretch x2 20" Standing ITB stretch x2 20" Standing Thomas stretch x2 20" Half pigeon stretch x2 20" Bridge c hip abd BluTB 2x10 Supine clam BluTB 2x10 Manual Therapy: STM and DTM to the piriformis and gluteals Skilled palpation to the piriformis and gluteal muscles to identify TrPs and taut muscle bands   [facility] Adult PT Treatment:                                                DATE: 08/05/22 Therapeutic Exercise: Piriformis stretch x3 20" ITB stretch x3 20" SLS R  Manual Therapy: STM c MTPR to the R piriformis and glut minimus Self Care: Instructed pt is sitting positions (Hips higher than knees, sleeping positions, proper shoe wear, and the use of heat or cold prior to sleeping to reduce and manage R hip pain Trigger Point Dry Needling Treatment: Pre-treatment instruction: Patient instructed on dry needling rationale, procedures, and possible side effects including pain during treatment  (  achy,cramping feeling), bruising, drop of blood, lightheadedness, nausea, sweating. Patient Consent Given: Yes Education handout provided: Yes Muscles treated: R piriformis, glut minimus, medius, and maximus Needle size and number .80m x.30 1 needle Electrical stimulation performed: No Parameters: N/A Treatment response/outcome: Twitch response elicited Post-treatment instructions: Patient instructed to expect possible mild to moderate muscle soreness later today and/or tomorrow. Patient instructed in methods to reduce muscle soreness and to continue prescribed HEP. If patient was dry needled over the lung field, patient was instructed on signs and symptoms of pneumothorax and, however unlikely, to see immediate medical attention should they occur. Patient was also educated on signs and symptoms of infection and to seek medical attention should they occur. Patient verbalized understanding of these instructions and education.   [facility]                                                [facility]: [facility] Therapeutic Exercise: Half kneeling c trunk rotation to the L stretch x3 20" Pigeon stretch x3 20" Piriformis stretch x3 20" Figure 4 stretch x2 20" SLR c hip IR x15 SL hip abd c IR x15 Lateral steps 4" x15 Updated HEP to complete hip abd in IR Manual Therapy: STM to the R TFL   [facility]                                                [facility]: [facility] Therapeutic Exercise: Half kneeling c trunk rotation to the L stretch x3 20 SL hip add 2x15 SL hip abd c IR 2x15 HL hip add sets x15 5" Hip clams x15 10sec BluTB STS x15 10# Manual Therapy: STM and DTM to R TFL  Skilled palpation to identify TrPs and taut muscle bands  Trigger Point Dry Needling Treatment: Pre-treatment instruction: Patient instructed on dry needling rationale, procedures, and possible side effects including pain during treatment (achy,cramping feeling), bruising, drop of blood,  lightheadedness, nausea, sweating. Patient Consent Given: Yes Education handout provided: Yes Muscles treated: R TFL Needle size and number .765mx.30 1 needle Electrical stimulation performed: No Parameters: N/A Treatment response/outcome: Twitch response elicited Post-treatment instructions: Patient instructed to expect possible mild to moderate muscle soreness later today and/or tomorrow. Patient instructed in methods to reduce muscle soreness and to continue prescribed HEP. If patient was dry needled over the lung field, patient was instructed on signs and symptoms of pneumothorax and, however unlikely, to see immediate medical attention should they occur. Patient was also educated on signs and symptoms of infection and to seek medical attention should they occur. Patient verbalized understanding of these instructions and education.   PATIENT EDUCATION:  Education details: HEP outer hip, tennis ball, Dry needling  Person educated: Patient Education method: Explanation, Demonstration, Verbal cues, and Handouts Education comprehension: verbalized understanding and needs further education     HOME EXERCISE PROGRAM: Access Code: [email or web address] URL: [email or web address] Date: 06/10/2022 Prepared by: AlGar PontoExercises - Supine Bilateral Hip Internal Rotation Stretch  - 2 x daily - 7 x weekly - 2 sets - 10 reps - 5-10 hold - Pigeon Pose  - 2 x daily - 7 x weekly - 1 sets - 3 reps - 30 hold - Clamshell  with Resistance  - 1-2 x daily - 7 x weekly - 2 sets - 10 reps - 0-5 hold - Supine Piriformis Stretch with Foot on Ground  - 1 x daily - 7 x weekly - 1 sets - 3 reps - 20 hold - Supine Figure 4 Piriformis Stretch  - 1 x daily - 7 x weekly - 1 sets - 3 reps - 20 hold - Standing ITB Stretch  - 1 x daily - 7 x weekly - 1 sets - 3 reps - 20 hold - Supine Bridge  - 1 x daily - 7 x weekly - 1 sets - 10 reps - 10 hold - Sidelying Hip Abduction in IR - 1 x daily - 7 x weekly - 2 sets -  10 reps - 3 hold - Supine Hip Adduction Isometric with Ball  - 1 x daily - 7 x weekly - 1 sets - 10 reps - 5 hold   ASSESSMENT:   CLINICAL IMPRESSION: Pt reports improvement with R hip/gluteal pain. Wearing her most supportive shoes at work and sleeping on a towel/pillow roll under her R iliac crest has been helpful measures to decrease and manage pain. Manual therapy with TPDN to the piriformis and gluteal muscles was completed today with muscle twitch elicited. Stretching and activation therex was then completed. Pt tolerated PT today without adverse effects. Overall, symptoms, flexibility and function have improved. Will discuss with pt continuation of PT vs DC the next PT session.  OBJECTIVE IMPAIRMENTS decreased mobility, difficulty walking, decreased ROM, hypomobility, increased fascial restrictions, impaired flexibility, obesity, and pain.    ACTIVITY LIMITATIONS sitting, standing, squatting, sleeping, transfers, and locomotion level   PARTICIPATION LIMITATIONS: shopping, community activity, and fitness   PERSONAL FACTORS 1 comorbidity: back pain   are also affecting patient's functional outcome.    REHAB POTENTIAL: Excellent   CLINICAL DECISION MAKING: Stable/uncomplicated   EVALUATION COMPLEXITY: Low     GOALS: 08/19/22 Goals reviewed with patient? Yes FOTO score will improve to 79% Baseline: 72% Status  72% 6th visit Goal status: ongoing    2.  PT will be able to sleep on her Rt side without increased pain in Rt hip.  Baseline: 06/30/22: better but last night was an issue Goal status: ongoing   3.  Pt will be able to sit with her Rt leg crossed over Lt. To don footwear.  Baseline: can do this now but still limited , L knee pain and increased effort Status: 07/27/22, R hip ER= 43d Goal status: Improved   4.  Pt will be able to complete HEP and lower body workout without increased pain .  Baseline:  Goal status: ongoing    5.  Pt will be able to walk in the community  as needed without increased hip pain .  Baseline: was at [facility] a couple of weeks ago, improved Goal status: ongoing    PLAN: PT FREQUENCY: 2x/week   PT DURATION: 6 weeks   PLANNED INTERVENTIONS: Therapeutic exercises, Therapeutic activity, Neuromuscular re-education, Balance training, Gait training, Patient/Family education, Self Care, Joint mobilization, Dry Needling, Spinal mobilization, Cryotherapy, Moist heat, Taping, Ionotophoresis '4mg'$ /ml Dexamethasone, Manual therapy. Assess R hip strength.   PLAN FOR NEXT SESSION: check HEP. Manual QL, Glute , DN hip mobility , STS progression. Assess R hip strength. Complete FOTO and discuss continuation vs. DC the next PT session.   Daniell Mancinas MS, PT 08/12/22 1:12 PM

## 2022-08-12 ENCOUNTER — Ambulatory Visit: Payer: No Typology Code available for payment source

## 2022-08-12 DIAGNOSIS — M25551 Pain in right hip: Secondary | ICD-10-CM

## 2022-08-12 DIAGNOSIS — M25651 Stiffness of right hip, not elsewhere classified: Secondary | ICD-10-CM

## 2022-08-15 ENCOUNTER — Ambulatory Visit: Payer: No Typology Code available for payment source | Admitting: Physical Therapy

## 2022-08-15 ENCOUNTER — Encounter: Payer: Self-pay | Admitting: Physical Therapy

## 2022-08-15 DIAGNOSIS — M25551 Pain in right hip: Secondary | ICD-10-CM

## 2022-08-15 DIAGNOSIS — M25651 Stiffness of right hip, not elsewhere classified: Secondary | ICD-10-CM

## 2022-08-15 NOTE — Therapy (Addendum)
OUTPATIENT PHYSICAL THERAPY TREATMENT NOTE DISCHARGE   Patient Name: Vanessa Byrd MRN: [id number] DOB:Jan 13, 1976, 46 y.o., female Today's Date: 08/15/2022  PCP: Pricilla Holm, MD  REFERRING PROVIDER: Pricilla Holm, MD       END OF SESSION:   PT End of Session - 08/15/22 0809     Visit Number 12    Number of Visits 12    Date for PT Re-Evaluation 08/19/22    Authorization Type [employer] employee FOC[employer] plan    Progress Note Due on Visit 20    PT Start Time 0800    PT Stop Time 0845    PT Time Calculation (min) 45 min                Past Medical History:  Diagnosis Date   Colitis    Esophagitis    H. pylori infection    Sinusitis    Past Surgical History:  Procedure Laterality Date   TONSILLECTOMY     WISDOM TOOTH EXTRACTION     Patient Active Problem List   Diagnosis Date Noted   Greater trochanteric bursitis of right hip 03/03/2022   Pelvic pain 02/04/2022   Encounter for general adult medical examination with abnormal findings 02/04/2022   Other specified hypothyroidism 11/05/2021   Cyst, dermoid, scalp and neck 11/05/2021   Fibroid 11/05/2021   Morbid obesity ([place]) 11/05/2021   Left foot pain 04/23/2021   Other fatigue 04/23/2021   Pre-diabetes 04/23/2021   Allergic contact dermatitis due to plants, except food 02/12/2021   Chronic foot pain, right 05/21/2020   Iron deficiency 07/19/2018   Gastroesophageal reflux disease with esophagitis 06/13/2018   Right lower quadrant abdominal pain 12/12/2017   Anxiety and depression 05/31/2013   ALLERGIC RHINITIS 11/24/2009    REFERRING DIAG: Pain in right hip  THERAPY DIAG:  Pain in right hip  Stiffness of right hip, not elsewhere classified  Rationale for Evaluation and Treatment Rehabilitation  SUBJECTIVE:    SUBJECTIVE STATEMENT: Pt reports she was a little sore after needling treatment last visit but no pain on arrival today.   PAIN:  Are you having pain? Yes: NPRS scale:  0/10 Pain location: Rt hip.   Pain description: tightness, Aggravating factors: lying on Rt side. , sit to stand Relieving factors: changing position, heat/ice, hip,flexor stretch   Pain range: 0-1/10   PRECAUTIONS: None   WEIGHT BEARING RESTRICTIONS No   FALLS:  Has patient fallen in last 6 months? No   LIVING ENVIRONMENT: Lives with: lives with their spouse Lives in: House/apartment Stairs: no diff.  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    PLOF: Independent. Has a son in college .    PATIENT GOALS I want to be able to move without pain, understand what the problem.      OBJECTIVE: (objective measures completed at initial evaluation unless otherwise dated)   DIAGNOSTIC FINDINGS: neg CT scan    PATIENT SURVEYS:  FOTO 72% FOTO  72% FOTO 77%   COGNITION:           Overall cognitive status: Within functional limits for tasks assessed                          SENSATION: WFL   EDEMA:  None    MUSCLE LENGTH: Hamstrings: Right tight  deg; Left less tight deg Thomas test: Right normal  deg; Left normal, tight L TFL  deg  POSTURE: No Significant postural limitations   PALPATION: TTP along Rt TFL but mostly Rt glute medius, minimus and beside L5, spasm and pain in Rt quadratus lumborum.    LOWER EXTREMITY ROM:   Active ROM Right eval Left eval RT 07/27/22  Hip flexion 100 deg  100 deg    Hip extension       Hip abduction       Hip adduction       Hip internal rotation [facility] WFL   Hip external rotation Tight, approx. 30 deg  Tight ,approx. 40 deg 43d  Knee flexion       Knee extension       Ankle dorsiflexion       Ankle plantarflexion       Ankle inversion       Ankle eversion        (Blank rows = not tested)   LOWER EXTREMITY MMT:   MMT Right eval Left eval Right 08/15/22  Hip flexion 5/5 5/5   Hip extension 4+/5 4+/5   Hip abduction 4+/5 5/5 5/5  Hip adduction       Hip internal rotation       Hip  external rotation       Knee flexion 5/5 5/5   Knee extension 5/5 5/5   Ankle dorsiflexion       Ankle plantarflexion       Ankle inversion       Ankle eversion        (Blank rows = not tested)   LOWER EXTREMITY SPECIAL TESTS:  Hip special tests: Saralyn Pilar (FABER) test: positive , Thomas test: negative, and Hip scouring test: negative   FUNCTIONAL TESTS:  NT  SLS WNL  Squats without pain in Rt hip, has knee discomfort    GAIT: Distance walked: 150 Assistive device utilized: None Level of assistance: Complete Independence Comments: no deviations        TODAY'S TREATMENT: [facility] Adult PT Treatment:                                                DATE: 08/15/22 Therapeutic Exercise: Review of HEP Discharge to HEP   [facility] Adult PT Treatment:                                                DATE: 08/12/22 Therapeutic Exercise: Piriformis stretch x2 20" Standing ITB stretch x2 20" Standing Thomas stretch x2 20" Half pigeon stretch x2 20" Bridge c hip abd BluTB 2x10 Supine clam BluTB 2x10 Manual Therapy: STM and DTM to the piriformis and gluteals Skilled palpation to the piriformis and gluteal muscles to identify TrPs and taut muscle bands   [facility] Adult PT Treatment:                                                DATE: 08/05/22 Therapeutic Exercise: Piriformis stretch x3 20" ITB stretch x3 20" SLS R  Manual Therapy: STM c MTPR to the R piriformis and glut minimus Self Care: Instructed pt is sitting positions (Hips higher than knees, sleeping positions, proper  shoe wear, and the use of heat or cold prior to sleeping to reduce and manage R hip pain Trigger Point Dry Needling Treatment: Pre-treatment instruction: Patient instructed on dry needling rationale, procedures, and possible side effects including pain during treatment (achy,cramping feeling), bruising, drop of blood, lightheadedness, nausea, sweating. Patient Consent Given: Yes Education handout provided: Yes Muscles  treated: R piriformis, glut minimus, medius, and maximus Needle size and number .31m x.30 1 needle Electrical stimulation performed: No Parameters: N/A Treatment response/outcome: Twitch response elicited Post-treatment instructions: Patient instructed to expect possible mild to moderate muscle soreness later today and/or tomorrow. Patient instructed in methods to reduce muscle soreness and to continue prescribed HEP. If patient was dry needled over the lung field, patient was instructed on signs and symptoms of pneumothorax and, however unlikely, to see immediate medical attention should they occur. Patient was also educated on signs and symptoms of infection and to seek medical attention should they occur. Patient verbalized understanding of these instructions and education.   [facility]                                                [facility]: [facility] Therapeutic Exercise: Half kneeling c trunk rotation to the L stretch x3 20" Pigeon stretch x3 20" Piriformis stretch x3 20" Figure 4 stretch x2 20" SLR c hip IR x15 SL hip abd c IR x15 Lateral steps 4" x15 Updated HEP to complete hip abd in IR Manual Therapy: STM to the R TFL   [facility]                                                [facility]: [facility] Therapeutic Exercise: Half kneeling c trunk rotation to the L stretch x3 20 SL hip add 2x15 SL hip abd c IR 2x15 HL hip add sets x15 5" Hip clams x15 10sec BluTB STS x15 10# Manual Therapy: STM and DTM to R TFL  Skilled palpation to identify TrPs and taut muscle bands  Trigger Point Dry Needling Treatment: Pre-treatment instruction: Patient instructed on dry needling rationale, procedures, and possible side effects including pain during treatment (achy,cramping feeling), bruising, drop of blood, lightheadedness, nausea, sweating. Patient Consent Given: Yes Education handout provided: Yes Muscles treated: R TFL Needle size and number .766mx.30 1  needle Electrical stimulation performed: No Parameters: N/A Treatment response/outcome: Twitch response elicited Post-treatment instructions: Patient instructed to expect possible mild to moderate muscle soreness later today and/or tomorrow. Patient instructed in methods to reduce muscle soreness and to continue prescribed HEP. If patient was dry needled over the lung field, patient was instructed on signs and symptoms of pneumothorax and, however unlikely, to see immediate medical attention should they occur. Patient was also educated on signs and symptoms of infection and to seek medical attention should they occur. Patient verbalized understanding of these instructions and education.   PATIENT EDUCATION:  Education details: HEP outer hip, tennis ball, Dry needling  Person educated: Patient Education method: Explanation, Demonstration, Verbal cues, and Handouts Education comprehension: verbalized understanding and needs further education     HOME EXERCISE PROGRAM: Access Code: [email or web address] URL: [email or web address] Date: 06/10/2022 Prepared by: AlGar PontoExercises - Supine Bilateral Hip Internal Rotation  Stretch  - 2 x daily - 7 x weekly - 2 sets - 10 reps - 5-10 hold - Pigeon Pose  - 2 x daily - 7 x weekly - 1 sets - 3 reps - 30 hold - Clamshell with Resistance  - 1-2 x daily - 7 x weekly - 2 sets - 10 reps - 0-5 hold - Supine Piriformis Stretch with Foot on Ground  - 1 x daily - 7 x weekly - 1 sets - 3 reps - 20 hold - Supine Figure 4 Piriformis Stretch  - 1 x daily - 7 x weekly - 1 sets - 3 reps - 20 hold - Standing ITB Stretch  - 1 x daily - 7 x weekly - 1 sets - 3 reps - 20 hold - Supine Bridge  - 1 x daily - 7 x weekly - 1 sets - 10 reps - 10 hold - Sidelying Hip Abduction in IR - 1 x daily - 7 x weekly - 2 sets - 10 reps - 3 hold - Supine Hip Adduction Isometric with Ball  - 1 x daily - 7 x weekly - 1 sets - 10 reps - 5 hold   ASSESSMENT:   CLINICAL  IMPRESSION: Pt reports improvement with R hip/gluteal pain. She denies difficulty with walking however can have intermittent discomfort with prolonged sitting and then getting up.Her sleep comfort is much improved. She is able to complete low level lower body work out as part of HEP without increased pain. Her FOTO score improved to near target. She has met all LTGs at this time and is agreeable to discharge to HEP. She plans to return to the gym for continued strengthening.   OBJECTIVE IMPAIRMENTS decreased mobility, difficulty walking, decreased ROM, hypomobility, increased fascial restrictions, impaired flexibility, obesity, and pain.    ACTIVITY LIMITATIONS sitting, standing, squatting, sleeping, transfers, and locomotion level   PARTICIPATION LIMITATIONS: shopping, community activity, and fitness   PERSONAL FACTORS 1 comorbidity: back pain   are also affecting patient's functional outcome.    REHAB POTENTIAL: Excellent   CLINICAL DECISION MAKING: Stable/uncomplicated   EVALUATION COMPLEXITY: Low     GOALS: 08/19/22 Goals reviewed with patient? Yes FOTO score will improve to 79% Baseline: 72% Status  72% 6th visit Status: 77%  Goal status: MET   2.  PT will be able to sleep on her Rt side without increased pain in Rt hip.  Baseline: 06/30/22: better but last night was an issue Status: 08/15/22: improved, use a towel roll which helps a lot  Goal status: MET   3.  Pt will be able to sit with her Rt leg crossed over Lt. To don footwear.  Baseline: can do this now but still limited , L knee pain and increased effort Status: 07/27/22, R hip ER= 43d Status: 08/15/22: able to complete task Goal status: MET   4.  Pt will be able to complete HEP and lower body workout without increased pain .  Baseline: Status: 08/15/22: is performing squats and step ups as part of home routine without pain Goal status: MET   5.  Pt will be able to walk in the community as needed without increased  hip pain .  Baseline: was at [facility] a couple of weeks ago, improved Status: 08/15/22: no difficulty with walking Goal status: MET    PLAN: PT FREQUENCY: 2x/week   PT DURATION: 6 weeks   PLANNED INTERVENTIONS: Therapeutic exercises, Therapeutic activity, Neuromuscular re-education, Balance training, Gait training, Patient/Family education, Self  Care, Joint mobilization, Dry Needling, Spinal mobilization, Cryotherapy, Moist heat, Taping, Ionotophoresis 12m/ml Dexamethasone, Manual therapy. Assess R hip strength.   PLAN FOR NEXT SESSION:N/A Discharge to HEP today JHessie Diener PTA 08/15/22 8:40 AM Phone: [phone]: [phone]    PHYSICAL THERAPY DISCHARGE SUMMARY  Visits from Start of Care: 12  Current functional level related to goals / functional outcomes: See above    Remaining deficits: None limiting function   Education / Equipment: HEP, Dry needling   Patient agrees to discharge. Patient goals were met. Patient is being discharged due to meeting the stated rehab goals.  JRaeford Razor PT 08/31/22 8:16 AM Phone: [phone]: [phone]

## 2022-09-29 ENCOUNTER — Other Ambulatory Visit: Payer: Self-pay | Admitting: Internal Medicine

## 2022-09-29 ENCOUNTER — Other Ambulatory Visit (HOSPITAL_COMMUNITY): Payer: Self-pay

## 2022-09-29 MED ORDER — LEVOTHYROXINE SODIUM 50 MCG PO TABS
50.0000 ug | ORAL_TABLET | Freq: Every day | ORAL | 0 refills | Status: DC
Start: 2022-09-29 — End: 2023-04-26
  Filled 2022-09-29: qty 90, 90d supply, fill #0

## 2022-12-23 ENCOUNTER — Other Ambulatory Visit (HOSPITAL_COMMUNITY): Payer: Self-pay

## 2022-12-23 ENCOUNTER — Telehealth: Payer: 59 | Admitting: Emergency Medicine

## 2022-12-23 DIAGNOSIS — R0981 Nasal congestion: Secondary | ICD-10-CM

## 2022-12-23 MED ORDER — IPRATROPIUM BROMIDE 0.03 % NA SOLN
2.0000 | Freq: Two times a day (BID) | NASAL | 0 refills | Status: DC
  Filled 2022-12-23: qty 30, 43d supply, fill #0

## 2022-12-23 NOTE — Progress Notes (Signed)
E-Visit for Sinus Problems  We are sorry that you are not feeling well.  Here is how we plan to help!  Based on what you have shared with me it looks like you have sinusitis.  Sinusitis is inflammation and infection in the sinus cavities of the head.  Based on your presentation I believe you most likely have Acute Viral Sinusitis.This is an infection most likely caused by a virus. There is not specific treatment for viral sinusitis other than to help you with the symptoms until the infection runs its course.  You may use an oral decongestant such as Mucinex D or if you have glaucoma or high blood pressure use plain Mucinex. Saline nasal spray help and can safely be used as often as needed for congestion, I have prescribed: Ipratropium Bromide nasal spray 0.03% 2 sprays in eah nostril 2-3 times a day  Some authorities believe that zinc sprays or the use of Echinacea may shorten the course of your symptoms.  Sinus infections are not as easily transmitted as other respiratory infection, however we still recommend that you avoid close contact with loved ones, especially the very young and elderly.  Remember to wash your hands thoroughly throughout the day as this is the number one way to prevent the spread of infection!  Providers prescribe antibiotics to treat infections caused by bacteria. Antibiotics are very powerful in treating bacterial infections when they are used properly. To maintain their effectiveness, they should be used only when necessary. Overuse of antibiotics has resulted in the development of superbugs that are resistant to treatment!    After careful review of your answers, I would not recommend an antibiotic for your condition.  Antibiotics are not effective against viruses and therefore should not be used to treat them. Common examples of infections caused by viruses include colds and flu   Home Care: Only take medications as instructed by your medical team. Do not take these  medications with alcohol. A steam or ultrasonic humidifier can help congestion.  You can place a towel over your head and breathe in the steam from hot water coming from a faucet. Avoid close contacts especially the very young and the elderly. Cover your mouth when you cough or sneeze. Always remember to wash your hands.  Get Help Right Away If: You develop worsening fever or sinus pain. You develop a severe head ache or visual changes. Your symptoms persist after you have completed your treatment plan.  Make sure you Understand these instructions. Will watch your condition. Will get help right away if you are not doing well or get worse.   Thank you for choosing an e-visit.  Your e-visit answers were reviewed by a board certified advanced clinical practitioner to complete your personal care plan. Depending upon the condition, your plan could have included both over the counter or prescription medications.  Please review your pharmacy choice. Make sure the pharmacy is open so you can pick up prescription now. If there is a problem, you may contact your provider through MyChart messaging and have the prescription routed to another pharmacy.  Your safety is important to us. If you have drug allergies check your prescription carefully.   For the next 24 hours you can use MyChart to ask questions about today's visit, request a non-urgent call back, or ask for a work or school excuse. You will get an email in the next two days asking about your experience. I hope that your e-visit has been valuable and will   speed your recovery.  Approximately 5 minutes was used in reviewing the patient's chart, questionnaire, prescribing medications, and documentation.  

## 2023-04-13 ENCOUNTER — Encounter: Payer: Self-pay | Admitting: Obstetrics and Gynecology

## 2023-04-26 ENCOUNTER — Ambulatory Visit (INDEPENDENT_AMBULATORY_CARE_PROVIDER_SITE_OTHER): Payer: 59 | Admitting: Internal Medicine

## 2023-04-26 ENCOUNTER — Other Ambulatory Visit (HOSPITAL_COMMUNITY): Payer: Self-pay

## 2023-04-26 ENCOUNTER — Encounter: Payer: Self-pay | Admitting: Internal Medicine

## 2023-04-26 VITALS — BP 118/80 | HR 71 | Temp 98.4°F | Ht 63.0 in | Wt 202.0 lb

## 2023-04-26 DIAGNOSIS — F32A Depression, unspecified: Secondary | ICD-10-CM

## 2023-04-26 DIAGNOSIS — R7303 Prediabetes: Secondary | ICD-10-CM | POA: Diagnosis not present

## 2023-04-26 DIAGNOSIS — E611 Iron deficiency: Secondary | ICD-10-CM | POA: Diagnosis not present

## 2023-04-26 DIAGNOSIS — Z0001 Encounter for general adult medical examination with abnormal findings: Secondary | ICD-10-CM

## 2023-04-26 DIAGNOSIS — Z1283 Encounter for screening for malignant neoplasm of skin: Secondary | ICD-10-CM

## 2023-04-26 DIAGNOSIS — Z0000 Encounter for general adult medical examination without abnormal findings: Secondary | ICD-10-CM

## 2023-04-26 DIAGNOSIS — F419 Anxiety disorder, unspecified: Secondary | ICD-10-CM

## 2023-04-26 DIAGNOSIS — E038 Other specified hypothyroidism: Secondary | ICD-10-CM | POA: Diagnosis not present

## 2023-04-26 LAB — LIPID PANEL
Cholesterol: 170 mg/dL (ref 0–200)
HDL: 37.3 mg/dL — ABNORMAL LOW (ref >39.00)
LDL Cholesterol: 119 mg/dL — ABNORMAL HIGH (ref 0–99)
NonHDL: 132.42
Total CHOL/HDL Ratio: 5
Triglycerides: 68 mg/dL (ref 0.0–149.0)
VLDL: 13.6 mg/dL (ref 0.0–40.0)

## 2023-04-26 LAB — CBC
HCT: 43.8 % (ref 36.0–46.0)
Hemoglobin: 14.4 g/dL (ref 12.0–15.0)
MCHC: 32.9 g/dL (ref 30.0–36.0)
MCV: 86.8 fl (ref 78.0–100.0)
Platelets: 275 10*3/uL (ref 150.0–400.0)
RBC: 5.04 Mil/uL (ref 3.87–5.11)
RDW: 14.1 % (ref 11.5–15.5)
WBC: 7.5 10*3/uL (ref 4.0–10.5)

## 2023-04-26 LAB — HEMOGLOBIN A1C: Hgb A1c MFr Bld: 5.9 % (ref 4.6–6.5)

## 2023-04-26 LAB — COMPREHENSIVE METABOLIC PANEL
ALT: 21 U/L (ref 0–35)
AST: 18 U/L (ref 0–37)
Albumin: 4.1 g/dL (ref 3.5–5.2)
Alkaline Phosphatase: 83 U/L (ref 39–117)
BUN: 11 mg/dL (ref 6–23)
CO2: 28 mEq/L (ref 19–32)
Calcium: 9.2 mg/dL (ref 8.4–10.5)
Chloride: 103 mEq/L (ref 96–112)
Creatinine, Ser: 0.65 mg/dL (ref 0.40–1.20)
GFR: 104.59 mL/min (ref >60.00)
Glucose, Bld: 108 mg/dL — ABNORMAL HIGH (ref 70–99)
Potassium: 3.7 mEq/L (ref 3.5–5.1)
Sodium: 139 mEq/L (ref 135–145)
Total Bilirubin: 0.5 mg/dL (ref 0.2–1.2)
Total Protein: 6.7 g/dL (ref 6.0–8.3)

## 2023-04-26 LAB — URINALYSIS, ROUTINE W REFLEX MICROSCOPIC
Bilirubin Urine: NEGATIVE
Hgb urine dipstick: NEGATIVE
Ketones, ur: NEGATIVE
Leukocytes,Ua: NEGATIVE
Nitrite: NEGATIVE
RBC / HPF: NONE SEEN (ref 0–?)
Specific Gravity, Urine: 1.01 (ref 1.000–1.030)
Total Protein, Urine: NEGATIVE
Urine Glucose: NEGATIVE
Urobilinogen, UA: 0.2 (ref 0.0–1.0)
pH: 6 (ref 5.0–8.0)

## 2023-04-26 LAB — TSH: TSH: 1.23 u[IU]/mL (ref 0.35–5.50)

## 2023-04-26 MED ORDER — LEVOTHYROXINE SODIUM 50 MCG PO TABS
50.0000 ug | ORAL_TABLET | Freq: Every day | ORAL | 3 refills | Status: AC
  Filled 2023-04-26: qty 90, 90d supply, fill #0

## 2023-04-26 MED ORDER — BUPROPION HCL ER (XL) 150 MG PO TB24
150.0000 mg | ORAL_TABLET | Freq: Every day | ORAL | 1 refills | Status: DC
  Filled 2023-04-26: qty 90, 90d supply, fill #0

## 2023-04-26 NOTE — Progress Notes (Signed)
   Subjective:   Patient ID: Vanessa Byrd, female    DOB: [birth year], 47 y.o.   MRN: [id number]  HPI The patient is here for physical.  PMH, [facility] [address], social history reviewed and updated  Review of Systems  Constitutional: Negative.   HENT: Negative.    Eyes: Negative.   Respiratory:  Negative for cough, chest tightness and shortness of breath.   Cardiovascular:  Negative for chest pain, palpitations and leg swelling.  Gastrointestinal:  Negative for abdominal distention, abdominal pain, constipation, diarrhea, nausea and vomiting.  Musculoskeletal: Negative.   Skin: Negative.   Neurological: Negative.   Psychiatric/Behavioral: Negative.      Objective:  Physical Exam Constitutional:      Appearance: She is well-developed.  HENT:     Head: Normocephalic and atraumatic.  Cardiovascular:     Rate and Rhythm: Normal rate and regular rhythm.  Pulmonary:     Effort: Pulmonary effort is normal. No respiratory distress.     Breath sounds: Normal breath sounds. No wheezing or rales.  Abdominal:     General: Bowel sounds are normal. There is no distension.     Palpations: Abdomen is soft.     Tenderness: There is no abdominal tenderness. There is no rebound.  Musculoskeletal:     Cervical back: Normal range of motion.  Skin:    General: Skin is warm and dry.  Neurological:     Mental Status: She is alert and oriented to person, place, and time.     Coordination: Coordination normal.     Vitals:   04/26/23 0846  BP: 118/80  Pulse: 71  Temp: 98.4 F (36.9 C)  TempSrc: Oral  SpO2: 99%  Weight: 202 lb (91.6 kg)  Height: 5\' 3"  (1.6 m)    Assessment & Plan:

## 2023-04-26 NOTE — Patient Instructions (Signed)
We have sent in wellbutrin to see if this helps.

## 2023-04-28 ENCOUNTER — Encounter: Payer: Self-pay | Admitting: Internal Medicine

## 2023-04-28 NOTE — Assessment & Plan Note (Signed)
Off prozac as she was taking intermittently. She needs replacement and would like agent that may help with weight loss as well. Rx wellbutrin 150 mg daily and she will monitor and let us know in 1 month efficacy.

## 2023-04-28 NOTE — Assessment & Plan Note (Signed)
Checking TSH and adjust synthroid 50 mcg daily. Adjust as needed.

## 2023-04-28 NOTE — Assessment & Plan Note (Signed)
BMI 35.7 and complicated by pre-diabetes and GERD and anxiety/depression. Counseled about diet and she will start wellbutrin to help with this.

## 2023-04-28 NOTE — Assessment & Plan Note (Signed)
Checking HgA1c and adjust as needed.  

## 2023-04-28 NOTE — Assessment & Plan Note (Signed)
Flu shot yearly. Tetanus up to date. Colonoscopy up to date. Mammogram up to date with gyn, pap smear up to date with gyn. Counseled about sun safety and mole surveillance. Counseled about the dangers of distracted driving. Given 10 year screening recommendations.

## 2023-04-28 NOTE — Assessment & Plan Note (Signed)
Checking CBC for stability.

## 2023-05-11 ENCOUNTER — Encounter: Payer: Self-pay | Admitting: Internal Medicine

## 2023-05-11 NOTE — Telephone Encounter (Signed)
Please advise as MD is out office

## 2023-05-23 ENCOUNTER — Encounter (HOSPITAL_COMMUNITY): Payer: Self-pay

## 2023-05-23 ENCOUNTER — Other Ambulatory Visit (HOSPITAL_COMMUNITY): Payer: Self-pay

## 2023-05-23 ENCOUNTER — Ambulatory Visit (HOSPITAL_COMMUNITY)
Admission: EM | Admit: 2023-05-23 | Discharge: 2023-05-23 | Disposition: A | Discharge status: Home / Self Care | Payer: 59 | Attending: Nurse Practitioner | Admitting: Nurse Practitioner

## 2023-05-23 DIAGNOSIS — T50905A Adverse effect of unspecified drugs, medicaments and biological substances, initial encounter: Secondary | ICD-10-CM | POA: Diagnosis not present

## 2023-05-23 DIAGNOSIS — R21 Rash and other nonspecific skin eruption: Secondary | ICD-10-CM | POA: Diagnosis not present

## 2023-05-23 DIAGNOSIS — L509 Urticaria, unspecified: Secondary | ICD-10-CM | POA: Diagnosis not present

## 2023-05-23 MED ORDER — HYDROXYZINE HCL 10 MG PO TABS
10.0000 mg | ORAL_TABLET | Freq: Three times a day (TID) | ORAL | 0 refills | Status: DC | PRN
  Filled 2023-05-23: qty 30, 10d supply, fill #0

## 2023-05-23 NOTE — Discharge Instructions (Addendum)
As we discussed, I suspect the rash and itching is coming from the Wellbutrin.  Please stop taking this medication and do not take it again in the future.  Start taking the hydroxyzine every 8 hours as needed for itching.  Also continue Allegra daily and take at nighttime as well to help with the itching.  Seek care emergently if you develop shortness of breath, throat or tongue swelling, or inability to tolerate secretions.

## 2023-05-23 NOTE — ED Triage Notes (Signed)
Patient here today with c/o itchy rash all over that started last night. She started taking Wellbutrin last month and after taking it for 2 weeks and started having these same symptoms on face and neck so she stopped taking it and the symptoms went away. She had also started on a moisturizer at around the same time so she had stopped that too. She started taking the Wellbutrin again last Thursday. Last taken Sunday night.

## 2023-05-23 NOTE — ED Provider Notes (Signed)
[facility]    CSN: [id number] Arrival date & time: 05/23/23  0820      History   Chief Complaint Chief Complaint  Patient presents with   Allergic Reaction    HPI Vanessa Byrd is a 47 y.o. female.   Patient presents today with itchy, raised, red rash to left and right upper extremities, face, and lower extremities with that began last night.  Reports she had similar symptoms a couple of weeks ago after starting Wellbutrin, but also used a new moisturizer and did not know if it was from the moisturizer.  Reports she stopped taking the Wellbutrin and using the moisturizer and symptoms improved.  She resumed the Wellbutrin 3 days ago.  Patient denies fever, shortness of breath, throat or tongue swelling.  She states she feels itchy all over.  Denies any recent change in any detergents or soaps other than the moisturizer which she has discontinued now for 2 weeks.  No other recent medication or supplement changes.  Took Benadryl last night which seemed to help with the itching a little bit.    Past Medical History:  Diagnosis Date   Colitis    Esophagitis    H. pylori infection    Sinusitis     Patient Active Problem List   Diagnosis Date Noted   Greater trochanteric bursitis of right hip 03/03/2022   Pelvic pain 02/04/2022   Encounter for general adult medical examination with abnormal findings 02/04/2022   Other specified hypothyroidism 11/05/2021   Cyst, dermoid, scalp and neck 11/05/2021   Fibroid 11/05/2021   Morbid obesity (HCC) 11/05/2021   Left foot pain 04/23/2021   Other fatigue 04/23/2021   Pre-diabetes 04/23/2021   Chronic foot pain, right 05/21/2020   Iron deficiency 07/19/2018   Gastroesophageal reflux disease with esophagitis 06/13/2018   Anxiety and depression 05/31/2013   ALLERGIC RHINITIS 11/24/2009    Past Surgical History:  Procedure Laterality Date   TONSILLECTOMY     WISDOM TOOTH EXTRACTION      OB History   No  obstetric history on file.      Home Medications    Prior to Admission medications   Medication Sig Start Date End Date Taking? Authorizing Provider  hydrOXYzine (ATARAX) 10 MG tablet Take 1 tablet (10 mg total) by mouth every 8 (eight) hours as needed for itching. Do not take with alcohol or while driving or operating heavy machinery.  May cause drowsiness. 05/23/23  Yes Valentino Nose, NP  fluticasone (FLONASE) 50 MCG/ACT nasal spray Place 2 sprays into both nostrils daily. 09/17/17   Belinda Fisher, PA-C  levothyroxine (SYNTHROID) 50 MCG tablet Take 1 tablet (50 mcg total) by mouth daily. 04/26/23   Myrlene Broker, MD  Multiple Vitamin (MULTIVITAMIN) tablet Take 1 tablet by mouth daily.    [provider]  omeprazole (PRILOSEC) 40 MG capsule Take 1 capsule (40 mg total) by mouth 2 (two) times daily. 06/13/18 07/07/20  Mansouraty, Netty Starring., MD    Family History Family History  Problem Relation Age of Onset   GER disease Mother    Colon cancer Neg Hx    Esophageal cancer Neg Hx    Inflammatory bowel disease Neg Hx    Liver disease Neg Hx    Pancreatic cancer Neg Hx    Rectal cancer Neg Hx    Stomach cancer Neg Hx     Social History Social History   Tobacco Use   Smoking status: Never  Smokeless tobacco: Never  Vaping Use   Vaping status: Never Used  Substance Use Topics   Alcohol use: No   Drug use: No     Allergies   Amoxicillin, Penicillins, and Wellbutrin [bupropion]   Review of Systems Review of Systems Per HPI  Physical Exam Triage Vital Signs ED Triage Vitals  Encounter Vitals Group     BP 05/23/23 0900 114/78     Systolic BP Percentile --      Diastolic BP Percentile --      Pulse Rate 05/23/23 0900 68     Resp 05/23/23 0900 16     Temp 05/23/23 0900 98.6 F (37 C)     Temp Source 05/23/23 0900 Oral     SpO2 05/23/23 0900 96 %     Weight 05/23/23 0900 200 lb (90.7 kg)     Height 05/23/23 0900 5\' 3"  (1.6 m)     Head  Circumference --      Peak Flow --      Pain Score 05/23/23 0859 0     Pain Loc --      Pain Education --      Exclude from Growth Chart --    No data found.  Updated Vital Signs BP 114/78 (BP Location: Right Arm)   Pulse 68   Temp 98.6 F (37 C) (Oral)   Resp 16   Ht 5\' 3"  (1.6 m)   Wt 200 lb (90.7 kg)   LMP 08/20/2018   SpO2 96%   BMI 35.43 kg/m   Visual Acuity Right Eye Distance:   Left Eye Distance:   Bilateral Distance:    Right Eye Near:   Left Eye Near:    Bilateral Near:     Physical Exam Vitals and nursing note reviewed.  Constitutional:      General: She is not in acute distress.    Appearance: Normal appearance. She is not toxic-appearing.  HENT:     Mouth/Throat:     Mouth: Mucous membranes are moist.     Pharynx: Oropharynx is clear. No oropharyngeal exudate or posterior oropharyngeal erythema.  Pulmonary:     Effort: Pulmonary effort is normal. No respiratory distress.  Skin:    General: Skin is warm and dry.     Capillary Refill: Capillary refill takes less than 2 seconds.     Findings: Rash present. Rash is papular and urticarial.     Comments: Rash noted to right upper arm posteriorly, face, bilateral forearms  Neurological:     Mental Status: She is alert and oriented to person, place, and time.  Psychiatric:        Behavior: Behavior is cooperative.      UC Treatments / Results  Labs (all labs ordered are listed, but only abnormal results are displayed) Labs Reviewed - No data to display  EKG   Radiology No results found.  Procedures Procedures (including critical care time)  Medications Ordered in UC Medications - No data to display  Initial Impression / Assessment and Plan / UC Course  I have reviewed the triage vital signs and the nursing notes.  Pertinent labs & imaging results that were available during my care of the patient were reviewed by me and considered in my medical decision making (see chart for details).    Patient is well-appearing, normotensive, afebrile, not tachycardic, not tachypneic, oxygenating well on room air.    1. Urticaria 2. Rash and nonspecific skin eruption 3. Adverse effect of drug, initial  encounter No red flags in history or on exam Oral airway is patent Treat with hydroxyzine up to 3 times daily as needed for itching, resume home Allegra twice daily Recommended complete discontinuance of Wellbutrin and marked as allergy in her chart Follow-up with PCP as planned to discuss possible alternative medications to Wellbutrin  The patient was given the opportunity to ask questions.  All questions answered to their satisfaction.  The patient is in agreement to this plan.    Final Clinical Impressions(s) / UC Diagnoses   Final diagnoses:  Urticaria  Rash and nonspecific skin eruption  Adverse effect of drug, initial encounter     Discharge Instructions      As we discussed, I suspect the rash and itching is coming from the Wellbutrin.  Please stop taking this medication and do not take it again in the future.  Start taking the hydroxyzine every 8 hours as needed for itching.  Also continue Allegra daily and take at nighttime as well to help with the itching.  Seek care emergently if you develop shortness of breath, throat or tongue swelling, or inability to tolerate secretions.    ED Prescriptions     Medication Sig Dispense Auth. Provider   hydrOXYzine (ATARAX) 10 MG tablet Take 1 tablet (10 mg total) by mouth every 8 (eight) hours as needed for itching. Do not take with alcohol or while driving or operating heavy machinery.  May cause drowsiness. 30 tablet Valentino Nose, NP      PDMP not reviewed this encounter.   Valentino Nose, NP 05/23/23 1059

## 2023-06-30 ENCOUNTER — Encounter (HOSPITAL_COMMUNITY): Payer: Self-pay

## 2023-06-30 ENCOUNTER — Ambulatory Visit (HOSPITAL_COMMUNITY)
Admission: EM | Admit: 2023-06-30 | Discharge: 2023-06-30 | Disposition: A | Discharge status: Home / Self Care | Payer: 59 | Attending: Emergency Medicine | Admitting: Emergency Medicine

## 2023-06-30 DIAGNOSIS — J069 Acute upper respiratory infection, unspecified: Secondary | ICD-10-CM

## 2023-06-30 DIAGNOSIS — H65192 Other acute nonsuppurative otitis media, left ear: Secondary | ICD-10-CM | POA: Diagnosis not present

## 2023-06-30 MED ORDER — DOXYCYCLINE HYCLATE 100 MG PO CAPS
100.0000 mg | ORAL_CAPSULE | Freq: Two times a day (BID) | ORAL | 0 refills | Status: AC
Start: 2023-06-30 — End: 2023-07-05

## 2023-06-30 MED ORDER — BENZONATATE 100 MG PO CAPS: 100.0000 mg | ORAL_CAPSULE | Freq: Three times a day (TID) | ORAL | 0 refills | Status: DC

## 2023-06-30 NOTE — ED Triage Notes (Signed)
Pt c/o cough and congestion since Monday. States now having lt ear pain and fullness. States taking OTC with no relief.

## 2023-06-30 NOTE — ED Provider Notes (Signed)
[facility]    CSN: [id number] Arrival date & time: 06/30/23  1628     History   Chief Complaint Chief Complaint  Patient presents with   Cough   Otalgia    HPI Vanessa Byrd is a 47 y.o. female.  4 day history of nasal congestion and cough Now developed left ear pain, fullness and popping  No fever. Denies GI symptoms. 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 pylori infection    Sinusitis     Patient Active Problem List   Diagnosis Date Noted   Greater trochanteric bursitis of right hip 03/03/2022   Pelvic pain 02/04/2022   Encounter for general adult medical examination with abnormal findings 02/04/2022   Other specified hypothyroidism 11/05/2021   Cyst, dermoid, scalp and neck 11/05/2021   Fibroid 11/05/2021   Morbid obesity (HCC) 11/05/2021   Left foot pain 04/23/2021   Other fatigue 04/23/2021   Pre-diabetes 04/23/2021   Chronic foot pain, right 05/21/2020   Iron deficiency 07/19/2018   Gastroesophageal reflux disease with esophagitis 06/13/2018   Anxiety and depression 05/31/2013   Allergic rhinitis 11/24/2009    Past Surgical History:  Procedure Laterality Date   TONSILLECTOMY     WISDOM TOOTH EXTRACTION      OB History   No obstetric history on file.      Home Medications    Prior to Admission medications   Medication Sig Start Date End Date Taking? Authorizing Provider  benzonatate (TESSALON) 100 MG capsule Take 1 capsule (100 mg total) by mouth every 8 (eight) hours. 06/30/23  Yes Marcele Kosta, Lurena Joiner, PA-C  doxycycline (VIBRAMYCIN) 100 MG capsule Take 1 capsule (100 mg total) by mouth 2 (two) times daily for 5 days. 06/30/23 07/05/23 Yes Demontrae Gilbert, Lurena Joiner, PA-C  fluticasone (FLONASE) 50 MCG/ACT nasal spray Place 2 sprays into both nostrils daily. 09/17/17   Cathie Hoops, Amy V, PA-C  hydrOXYzine (ATARAX) 10  MG tablet Take 1 tablet (10 mg total) by mouth every 8 (eight) hours as needed for itching. Do not take with alcohol or while driving or operating heavy machinery.  May cause drowsiness. 05/23/23   Valentino Nose, NP  levothyroxine (SYNTHROID) 50 MCG tablet Take 1 tablet (50 mcg total) by mouth daily. 04/26/23   Myrlene Broker, MD  Multiple Vitamin (MULTIVITAMIN) tablet Take 1 tablet by mouth daily.    [provider]  omeprazole (PRILOSEC) 40 MG capsule Take 1 capsule (40 mg total) by mouth 2 (two) times daily. 06/13/18 07/07/20  Mansouraty, Netty Starring., MD    Family History Family History  Problem Relation Age of Onset   GER disease Mother    Colon cancer Neg Hx    Esophageal cancer Neg Hx    Inflammatory bowel disease Neg Hx    Liver disease Neg Hx    Pancreatic cancer Neg Hx    Rectal cancer Neg Hx    Stomach cancer Neg Hx     Social History Social History   Tobacco Use   Smoking status: Never   Smokeless tobacco: Never  Vaping Use   Vaping status: Never Used  Substance Use Topics   Alcohol use: No   Drug use: No     Allergies   Amoxicillin, Penicillins, and Wellbutrin [bupropion]   Review of Systems Review of Systems Per HPI  Physical Exam Triage Vital  Signs ED Triage Vitals  Encounter Vitals Group     BP 06/30/23 1744 132/81     Systolic BP Percentile --      Diastolic BP Percentile --      Pulse Rate 06/30/23 1744 77     Resp 06/30/23 1744 18     Temp 06/30/23 1744 98.6 F (37 C)     Temp Source 06/30/23 1744 Oral     SpO2 06/30/23 1744 97 %     Weight --      Height --      Head Circumference --      Peak Flow --      Pain Score 06/30/23 1745 4     Pain Loc --      Pain Education --      Exclude from Growth Chart --    No data found.  Updated Vital Signs BP 132/81 (BP Location: Right Arm)   Pulse 77   Temp 98.6 F (37 C) (Oral)   Resp 18   LMP 08/20/2018   SpO2 97%   Physical Exam Vitals and nursing note reviewed.   Constitutional:      General: She is not in acute distress.    Appearance: She is not ill-appearing.  HENT:     Right Ear: Ear canal normal.     Left Ear: Ear canal normal. A middle ear effusion is present. Tympanic membrane is bulging.     Nose: No rhinorrhea.     Mouth/Throat:     Mouth: Mucous membranes are moist.     Pharynx: Oropharynx is clear. No posterior oropharyngeal erythema.  Cardiovascular:     Rate and Rhythm: Normal rate and regular rhythm.     Heart sounds: Normal heart sounds.  Pulmonary:     Effort: Pulmonary effort is normal.     Breath sounds: Normal breath sounds.  Musculoskeletal:     Cervical back: Normal range of motion. No rigidity.  Lymphadenopathy:     Cervical: No cervical adenopathy.  Skin:    General: Skin is warm and dry.  Neurological:     Mental Status: She is alert and oriented to person, place, and time.     UC Treatments / Results  Labs (all labs ordered are listed, but only abnormal results are displayed) Labs Reviewed - No data to display  EKG  Radiology No results found.  Procedures Procedures   Medications Ordered in UC Medications - No data to display  Initial Impression / Assessment and Plan / UC Course  I have reviewed the triage vital signs and the nursing notes.  Pertinent labs & imaging results that were available during my care of the patient were reviewed by me and considered in my medical decision making (see chart for details).  Afebrile  Left otitis media. Patient with PCN allergy (anaphylaxis). Doxy BID x 5 days. Continue daily flonase. Sent tessalon TID prn, with drowsy precautions. Work note provided. Return precautions   Final Clinical Impressions(s) / UC Diagnoses   Final diagnoses:  Other non-recurrent acute nonsuppurative otitis media of left ear  Viral URI with cough     Discharge Instructions      Please take medication as prescribed. Take with food to avoid upset stomach. Continue flonase  daily The tessalon cough pills can be taken 3x daily. If this medication makes you drowsy, take only one pill before bed  Please return if needed!     ED Prescriptions     Medication Sig  Dispense Auth. Provider   benzonatate (TESSALON) 100 MG capsule Take 1 capsule (100 mg total) by mouth every 8 (eight) hours. 30 capsule Avi Kerschner, PA-C   doxycycline (VIBRAMYCIN) 100 MG capsule Take 1 capsule (100 mg total) by mouth 2 (two) times daily for 5 days. 10 capsule Sherrell Weir, Lurena Joiner, PA-C      PDMP not reviewed this encounter.   Marlow Baars, [place] 06/30/23 1815

## 2023-06-30 NOTE — Discharge Instructions (Addendum)
Please take medication as prescribed. Take with food to avoid upset stomach. Continue flonase daily The tessalon cough pills can be taken 3x daily. If this medication makes you drowsy, take only one pill before bed  Please return if needed!

## 2023-07-04 ENCOUNTER — Encounter: Payer: Self-pay | Admitting: Internal Medicine

## 2023-07-04 IMAGING — DX DG FOOT COMPLETE 3+V*L*
3 series · 3 of 3 positions shown · non-contrast
Comparison: None.

CLINICAL DATA: Left lateral foot pain

EXAM:
LEFT FOOT - COMPLETE 3+ VIEW

[foot ap]
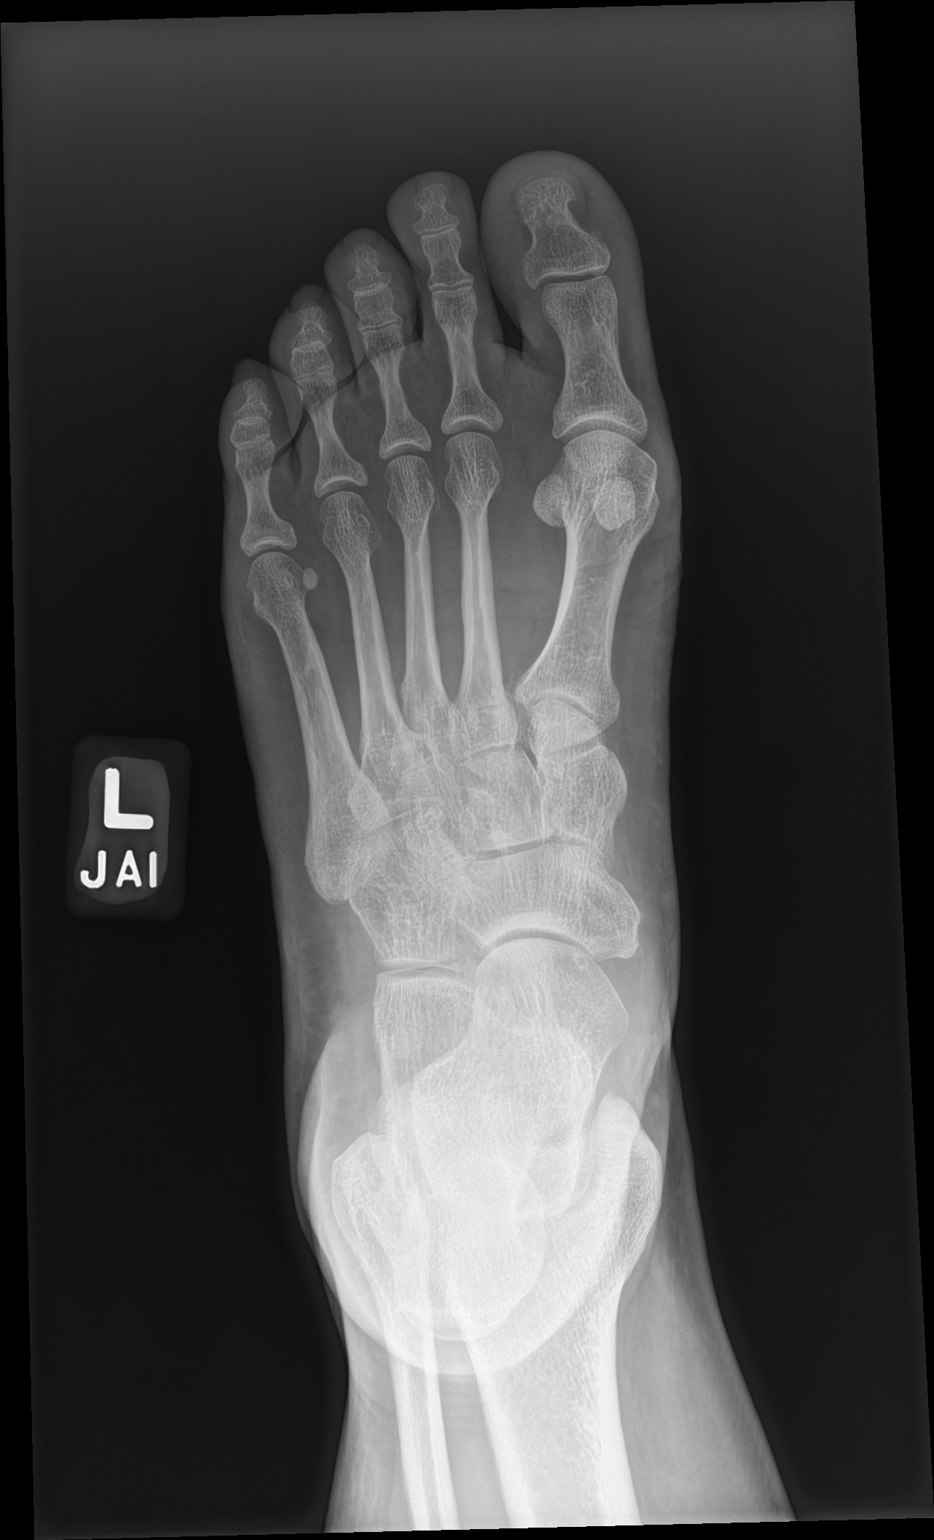

[foot obl]
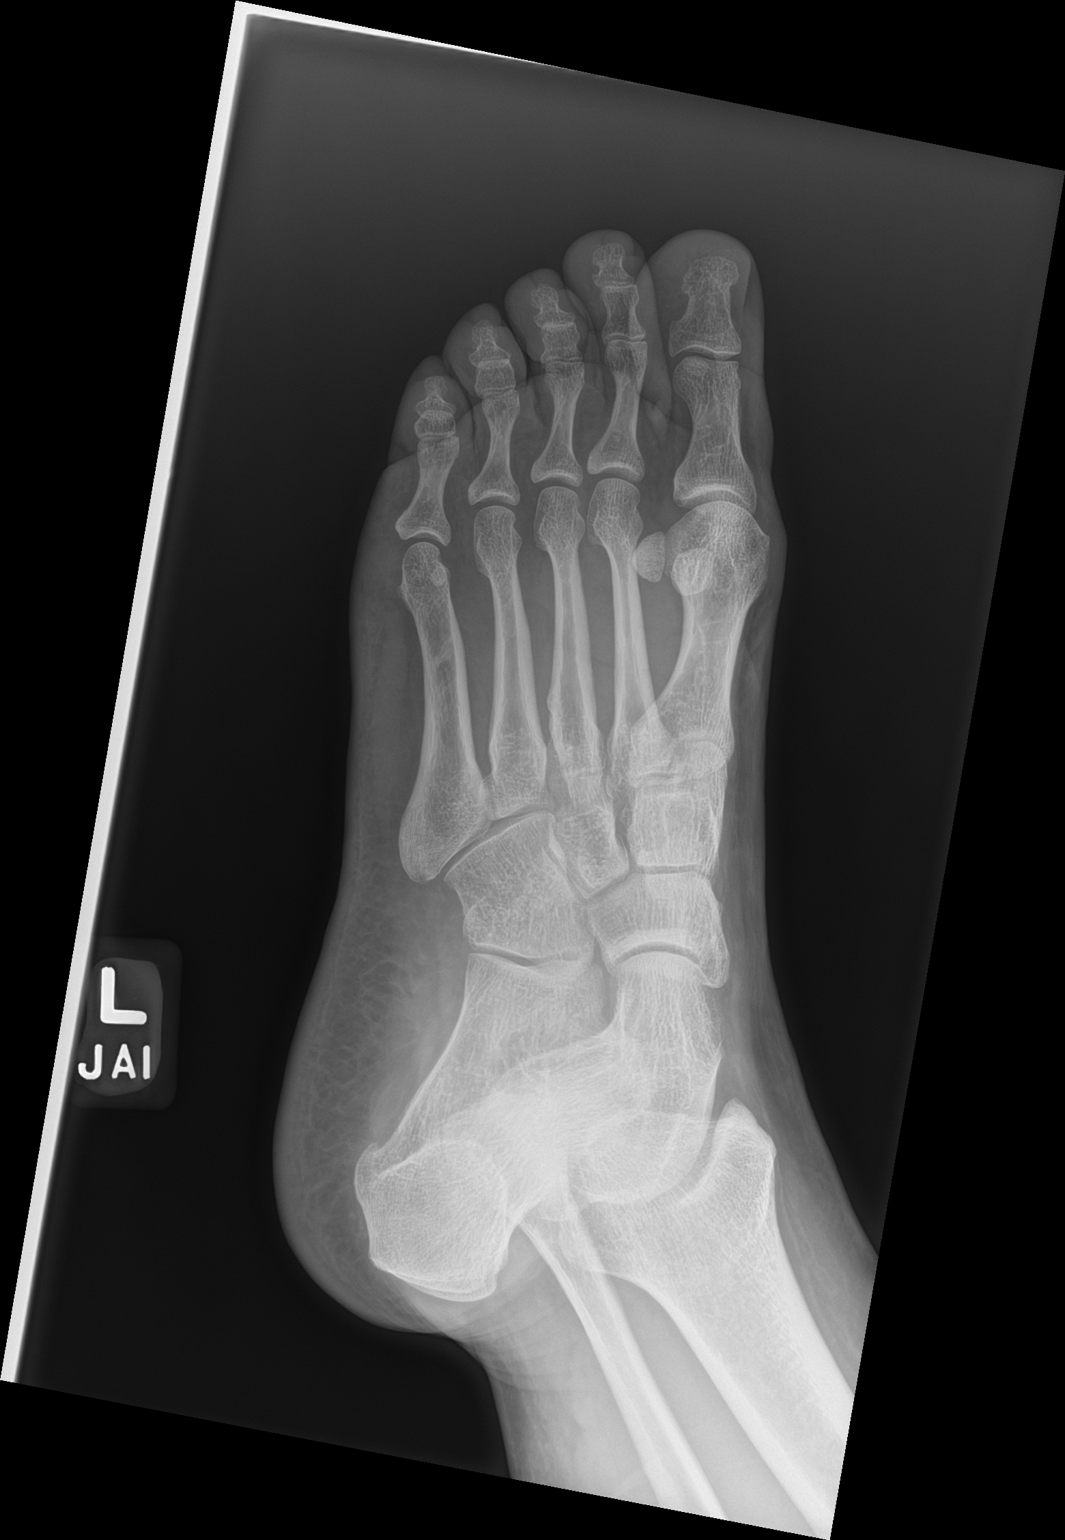

[foot lat]
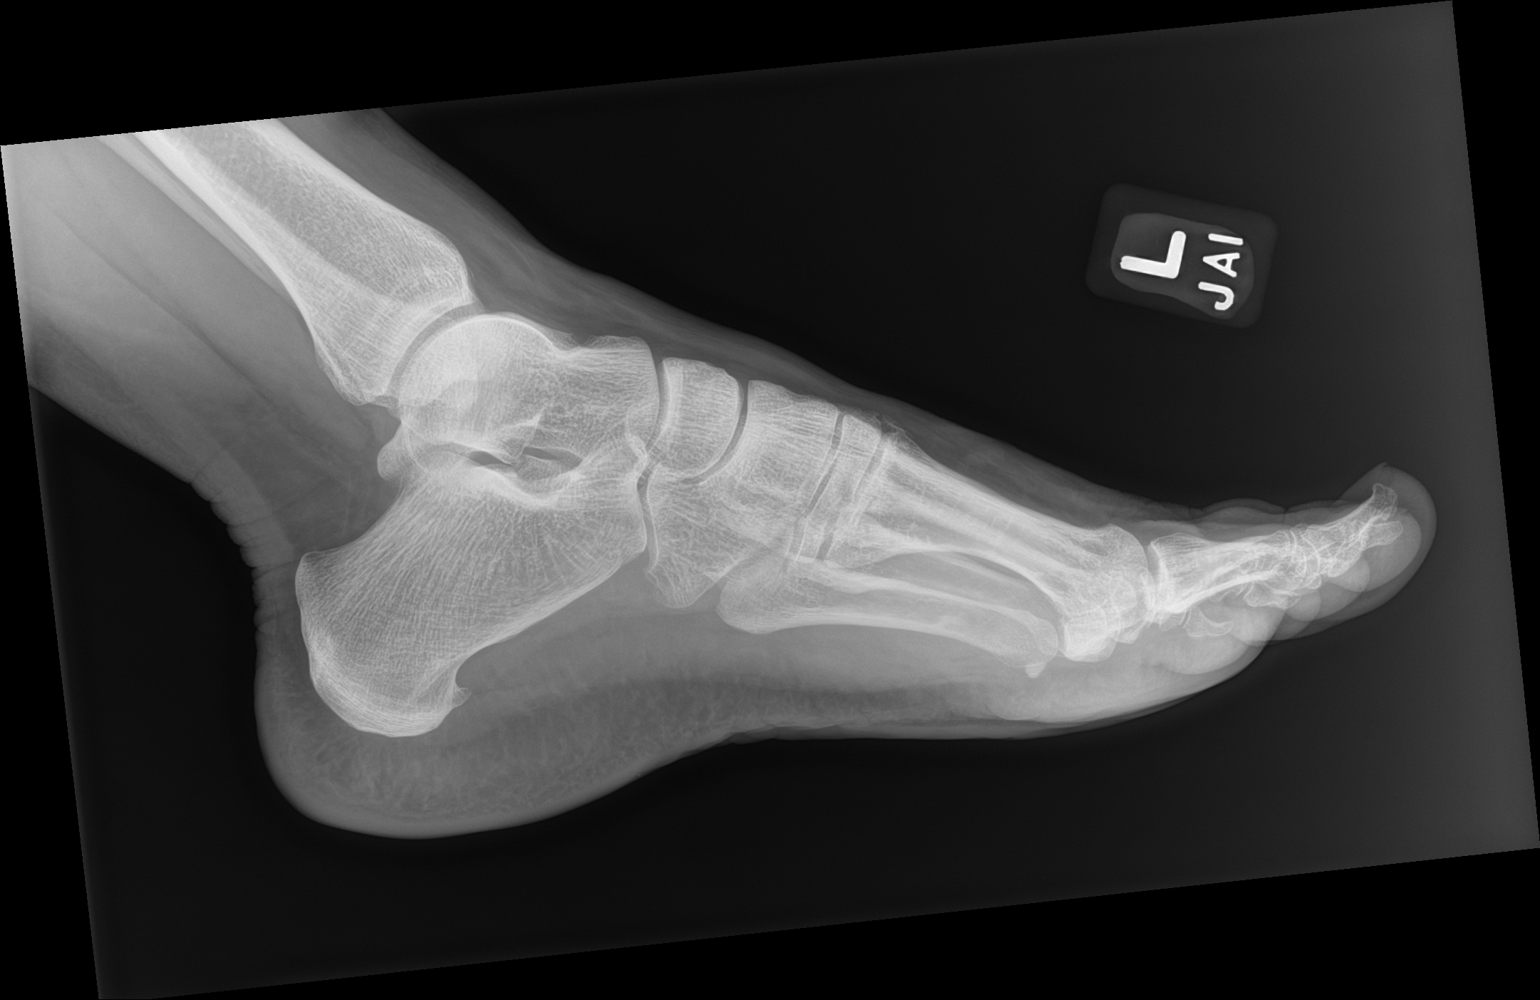

[3 of 3 positions shown; findings below may reference images not displayed]

FINDINGS: Normal alignment. No fracture or dislocation. Joint spaces are
preserved. Tiny plantar calcaneal spur is present. No ankle
effusion. Soft tissues are unremarkable.
IMPRESSION: Negative.

## 2023-07-30 ENCOUNTER — Emergency Department (HOSPITAL_COMMUNITY)
Admission: EM | Admit: 2023-07-30 | Discharge: 2023-07-30 | Disposition: A | Discharge status: Home / Self Care | Payer: 59 | Attending: Emergency Medicine | Admitting: Emergency Medicine

## 2023-07-30 ENCOUNTER — Other Ambulatory Visit: Payer: Self-pay

## 2023-07-30 ENCOUNTER — Emergency Department (HOSPITAL_COMMUNITY): Payer: 59

## 2023-07-30 DIAGNOSIS — E876 Hypokalemia: Secondary | ICD-10-CM | POA: Diagnosis not present

## 2023-07-30 DIAGNOSIS — D72829 Elevated white blood cell count, unspecified: Secondary | ICD-10-CM | POA: Diagnosis not present

## 2023-07-30 DIAGNOSIS — N390 Urinary tract infection, site not specified: Secondary | ICD-10-CM

## 2023-07-30 DIAGNOSIS — R350 Frequency of micturition: Secondary | ICD-10-CM | POA: Diagnosis present

## 2023-07-30 DIAGNOSIS — R7401 Elevation of levels of liver transaminase levels: Secondary | ICD-10-CM | POA: Insufficient documentation

## 2023-07-30 DIAGNOSIS — N3090 Cystitis, unspecified without hematuria: Secondary | ICD-10-CM

## 2023-07-30 DIAGNOSIS — R109 Unspecified abdominal pain: Secondary | ICD-10-CM | POA: Diagnosis not present

## 2023-07-30 DIAGNOSIS — N3091 Cystitis, unspecified with hematuria: Secondary | ICD-10-CM | POA: Diagnosis not present

## 2023-07-30 DIAGNOSIS — N3001 Acute cystitis with hematuria: Secondary | ICD-10-CM | POA: Diagnosis not present

## 2023-07-30 LAB — URINALYSIS, ROUTINE W REFLEX MICROSCOPIC
Bacteria, UA: NONE SEEN
Bilirubin Urine: NEGATIVE
Glucose, UA: NEGATIVE mg/dL
Ketones, ur: NEGATIVE mg/dL
Nitrite: NEGATIVE
Protein, ur: NEGATIVE mg/dL
Specific Gravity, Urine: 1.003 — ABNORMAL LOW (ref 1.005–1.030)
WBC, UA: >50 WBC/hpf (ref 0–5)
pH: 6 (ref 5.0–8.0)

## 2023-07-30 LAB — COMPREHENSIVE METABOLIC PANEL
ALT: 45 U/L — ABNORMAL HIGH (ref 0–44)
AST: 36 U/L (ref 15–41)
Albumin: 3.7 g/dL (ref 3.5–5.0)
Alkaline Phosphatase: 80 U/L (ref 38–126)
Anion gap: 9 (ref 5–15)
BUN: 9 mg/dL (ref 6–20)
CO2: 24 mmol/L (ref 22–32)
Calcium: 9.2 mg/dL (ref 8.9–10.3)
Chloride: 106 mmol/L (ref 98–111)
Creatinine, Ser: 0.65 mg/dL (ref 0.44–1.00)
GFR, Estimated: >60 mL/min (ref >60)
Glucose, Bld: 109 mg/dL — ABNORMAL HIGH (ref 70–99)
Potassium: 3.4 mmol/L — ABNORMAL LOW (ref 3.5–5.1)
Sodium: 139 mmol/L (ref 135–145)
Total Bilirubin: 0.6 mg/dL (ref <1.2)
Total Protein: 6.5 g/dL (ref 6.5–8.1)

## 2023-07-30 LAB — CBC
HCT: 41.4 % (ref 36.0–46.0)
Hemoglobin: 13.9 g/dL (ref 12.0–15.0)
MCH: 28.6 pg (ref 26.0–34.0)
MCHC: 33.6 g/dL (ref 30.0–36.0)
MCV: 85.2 fL (ref 80.0–100.0)
Platelets: 273 10*3/uL (ref 150–400)
RBC: 4.86 MIL/uL (ref 3.87–5.11)
RDW: 13.2 % (ref 11.5–15.5)
WBC: 11.3 10*3/uL — ABNORMAL HIGH (ref 4.0–10.5)
nRBC: 0 % (ref 0.0–0.2)

## 2023-07-30 LAB — LIPASE, BLOOD: Lipase: 40 U/L (ref 11–51)

## 2023-07-30 MED ORDER — SULFAMETHOXAZOLE-TRIMETHOPRIM 800-160 MG PO TABS
1.0000 | ORAL_TABLET | Freq: Two times a day (BID) | ORAL | 0 refills | Status: AC
  Filled 2023-07-30: qty 20, 10d supply, fill #0

## 2023-07-30 MED ORDER — OXYCODONE-ACETAMINOPHEN 5-325 MG PO TABS
1.0000 | ORAL_TABLET | Freq: Once | ORAL | Status: AC
  Administered 2023-07-30: 1 via ORAL
  Filled 2023-07-30: qty 1

## 2023-07-30 MED ORDER — IOHEXOL 350 MG/ML SOLN
75.0000 mL | Freq: Once | INTRAVENOUS | Status: AC | PRN
  Administered 2023-07-30: 75 mL via INTRAVENOUS

## 2023-07-30 MED ORDER — SULFAMETHOXAZOLE-TRIMETHOPRIM 800-160 MG PO TABS
1.0000 | ORAL_TABLET | Freq: Once | ORAL | Status: AC
  Administered 2023-07-30: 1 via ORAL
  Filled 2023-07-30: qty 1

## 2023-07-30 NOTE — ED Triage Notes (Signed)
Abd pain  burning when she voids  that started last Tuesday blood in urine frequency    lmp none

## 2023-07-30 NOTE — Discharge Instructions (Signed)
Today you were seen for a UTI.  Please pick up your prescription and take as prescribed.  If your symptoms persist please follow-up with your primary care physician for further evaluation and workup.  Thank you for letting us treat you today. After reviewing your labs and imaging, I feel you are safe to go home. Please follow up with your PCP in the next several days and provide them with your records from this visit. Return to the Emergency Room if pain becomes severe or symptoms worsen.

## 2023-07-30 NOTE — ED Provider Notes (Signed)
[place] [facility] AT [facility] Provider Note   CSN: [id number] Arrival date & time: 07/30/23  2004     History  Chief Complaint  Patient presents with   Urinary Frequency    Vanessa Byrd is a 47 y.o. female presents today for flank pain, burning with urination, and hematuria x 5 days.  Patient denies any history of pyelonephritis or kidney stones.  Patient endorses chills and nausea.  Patient denies vomiting, diarrhea, shortness of breath, chest pain, or fever.   Urinary Frequency       Home Medications Prior to Admission medications   Medication Sig Start Date End Date Taking? Authorizing Provider  sulfamethoxazole-trimethoprim (BACTRIM DS) 800-160 MG tablet Take 1 tablet by mouth 2 (two) times daily for 10 days. 07/30/23 08/09/23 Yes Dolphus Jenny, PA-C  benzonatate (TESSALON) 100 MG capsule Take 1 capsule (100 mg total) by mouth every 8 (eight) hours. 06/30/23   Rising, Rebecca, PA-C  fluticasone (FLONASE) 50 MCG/ACT nasal spray Place 2 sprays into both nostrils daily. 09/17/17   Cathie Hoops, Amy V, PA-C  hydrOXYzine (ATARAX) 10 MG tablet Take 1 tablet (10 mg total) by mouth every 8 (eight) hours as needed for itching. Do not take with alcohol or while driving or operating heavy machinery.  May cause drowsiness. 05/23/23   Valentino Nose, NP  levothyroxine (SYNTHROID) 50 MCG tablet Take 1 tablet (50 mcg total) by mouth daily. 04/26/23   Myrlene Broker, MD  Multiple Vitamin (MULTIVITAMIN) tablet Take 1 tablet by mouth daily.    [provider]  omeprazole (PRILOSEC) 40 MG capsule Take 1 capsule (40 mg total) by mouth 2 (two) times daily. 06/13/18 07/07/20  Mansouraty, Netty Starring., MD      Allergies    Amoxicillin, Penicillins, and Wellbutrin [bupropion]    Review of Systems   Review of Systems  Genitourinary:  Positive for dysuria, flank pain, frequency and hematuria.    Physical Exam Updated Vital Signs BP 126/75 (BP  Location: Right Arm)   Pulse 77   Temp 98.1 F (36.7 C)   Resp 20   Ht 5\' 3"  (1.6 m)   Wt 90.7 kg   LMP 08/20/2018   SpO2 98%   BMI 35.42 kg/m  Physical Exam Vitals and nursing note reviewed.  Constitutional:      General: She is not in acute distress.    Appearance: Normal appearance. She is well-developed.  HENT:     Head: Normocephalic and atraumatic.     Right Ear: External ear normal.     Left Ear: External ear normal.  Eyes:     Conjunctiva/sclera: Conjunctivae normal.  Cardiovascular:     Rate and Rhythm: Normal rate and regular rhythm.     Heart sounds: No murmur heard. Pulmonary:     Effort: Pulmonary effort is normal. No respiratory distress.     Breath sounds: Normal breath sounds.  Abdominal:     General: Bowel sounds are normal.     Palpations: Abdomen is soft.     Tenderness: There is no abdominal tenderness. There is right CVA tenderness and left CVA tenderness. There is no guarding or rebound.  Musculoskeletal:        General: No swelling.     Cervical back: Neck supple.  Skin:    General: Skin is warm and dry.     Capillary Refill: Capillary refill takes less than 2 seconds.  Neurological:     General: No focal deficit present.  Mental Status: She is alert.     Motor: No weakness.  Psychiatric:        Mood and Affect: Mood normal.     ED Results / Procedures / Treatments   Labs (all labs ordered are listed, but only abnormal results are displayed) Labs Reviewed  COMPREHENSIVE METABOLIC PANEL - Abnormal; Notable for the following components:      Result Value   Potassium 3.4 (*)    Glucose, Bld 109 (*)    ALT 45 (*)    All other components within normal limits  CBC - Abnormal; Notable for the following components:   WBC 11.3 (*)    All other components within normal limits  URINALYSIS, ROUTINE W REFLEX MICROSCOPIC - Abnormal; Notable for the following components:   Color, Urine STRAW (*)    APPearance HAZY (*)    Specific Gravity,  Urine 1.003 (*)    Hgb urine dipstick MODERATE (*)    Leukocytes,Ua MODERATE (*)    All other components within normal limits  LIPASE, BLOOD    EKG None  Radiology CT ABDOMEN PELVIS W CONTRAST  Result Date: 07/30/2023 CLINICAL DATA:  Abdominal pain and flank pain. EXAM: CT ABDOMEN AND PELVIS WITH CONTRAST TECHNIQUE: Multidetector CT imaging of the abdomen and pelvis was performed using the standard protocol following bolus administration of intravenous contrast. RADIATION DOSE REDUCTION: This exam was performed according to the departmental dose-optimization program which includes automated exposure control, adjustment of the mA and/or kV according to patient size and/or use of iterative reconstruction technique. CONTRAST:  75mL OMNIPAQUE IOHEXOL 350 MG/ML SOLN COMPARISON:  CT abdomen and pelvis 04/15/2022 FINDINGS: Lower chest: No acute abnormality. Hepatobiliary: No focal liver abnormality is seen. No gallstones, gallbladder wall thickening, or biliary dilatation. Pancreas: Unremarkable. No pancreatic ductal dilatation or surrounding inflammatory changes. Spleen: Normal in size without focal abnormality. Adrenals/Urinary Tract: Adrenal glands are unremarkable. Kidneys are normal, without renal calculi, focal lesion, or hydronephrosis. Bladder is unremarkable. Right extrarenal pelvis again noted, unchanged. Stomach/Bowel: Stomach is within normal limits. Appendix appears normal. No evidence of bowel wall thickening, distention, or inflammatory changes. Vascular/Lymphatic: No significant vascular findings are present. No enlarged abdominal or pelvic lymph nodes. Reproductive: Uterus and bilateral adnexa are unremarkable. Other: No abdominal wall hernia or abnormality. No abdominopelvic ascites. Musculoskeletal: No acute or significant osseous findings. IMPRESSION: No acute localizing process in the abdomen or pelvis. Electronically Signed   By: Darliss Cheney M.D.   On: 07/30/2023 23:26     Procedures Procedures    Medications Ordered in ED Medications  sulfamethoxazole-trimethoprim (BACTRIM DS) 800-160 MG per tablet 1 tablet (has no administration in time range)  oxyCODONE-acetaminophen (PERCOCET/ROXICET) 5-325 MG per tablet 1 tablet (1 tablet Oral Given 07/30/23 2018)  iohexol (OMNIPAQUE) 350 MG/ML injection 75 mL (75 mLs Intravenous Contrast Given 07/30/23 2319)    ED Course/ Medical Decision Making/ A&P                                 Medical Decision Making Amount and/or Complexity of Data Reviewed Labs: ordered. Radiology: ordered.  Risk Prescription drug management.   This patient presents to the ED with chief complaint(s) of dysuria, hematuria, flank pain with pertinent past medical history of none which further complicates the presenting complaint. The complaint involves an extensive differential diagnosis and also carries with it a high risk of complications and morbidity.    The differential diagnosis includes pyelonephritis,  kidney stone, UTI  Additional history obtained: Additional history obtained from spouse Records reviewed internal med office notes  ED Course and Reassessment:   Independent labs interpretation:  The following labs were independently interpreted:  CBC: Mild leukocytosis at 11.3 CMP: mildly elevated ALT, mild hypokalemia Lipase: 40 UA: Moderate hemoglobin, moderate leuks, mildly decreased specific gravity, greater than 50 WBCs  Independent visualization of imaging: - I independently visualized the following imaging with scope of interpretation limited to determining acute life threatening conditions related to emergency care: CT abdomen pelvis with contrast, which revealed no acute localizing process in abdomen or pelvis.  Consultation: - Consulted or discussed management/test interpretation w/ external professional: None  Consideration for admission or further workup: Consider admission further workup however patient's  vital signs, physical exam, labs, and imaging of all been reassuring she will be treated outpatient with antibiotics as a pyelonephritis/acute cystitis.  Patient should follow-up with PCP if symptoms persist for further evaluation and workup.        Final Clinical Impression(s) / ED Diagnoses Final diagnoses:  Cystitis  Urinary tract infection with hematuria, site unspecified    Rx / DC Orders ED Discharge Orders          Ordered    sulfamethoxazole-trimethoprim (BACTRIM DS) 800-160 MG tablet  2 times daily        07/30/23 2344              Dolphus Jenny, PA-C 07/30/23 2346    Melene Plan, DO 08/02/23 [phone]

## 2023-07-31 ENCOUNTER — Other Ambulatory Visit (HOSPITAL_COMMUNITY): Payer: Self-pay

## 2023-10-27 ENCOUNTER — Telehealth: Payer: BC Managed Care – PPO | Admitting: Physician Assistant

## 2023-10-27 ENCOUNTER — Other Ambulatory Visit (HOSPITAL_COMMUNITY): Payer: Self-pay

## 2023-10-27 DIAGNOSIS — J329 Chronic sinusitis, unspecified: Secondary | ICD-10-CM

## 2023-10-27 MED ORDER — DOXYCYCLINE HYCLATE 100 MG PO TABS: 100.0000 mg | ORAL_TABLET | Freq: Two times a day (BID) | ORAL | 0 refills | Status: DC

## 2023-10-27 NOTE — Addendum Note (Signed)
 Addended by: Margaretann Loveless on: 10/27/2023 05:40 PM   Modules accepted: Orders, Level of Service

## 2023-10-27 NOTE — Progress Notes (Signed)
E-Visit for Sinus Problems  We are sorry that you are not feeling well.  Here is how we plan to help!  Based on what you have shared with me it looks like you have sinusitis.  Sinusitis is inflammation and infection in the sinus cavities of the head.  Based on your presentation I believe you most likely have Acute Bacterial Sinusitis.  This is an infection caused by bacteria and is treated with antibiotics. I have prescribed Doxycycline 100mg by mouth twice a day for 10 days. You may use an oral decongestant such as Mucinex D or if you have glaucoma or high blood pressure use plain Mucinex. Saline nasal spray help and can safely be used as often as needed for congestion.  If you develop worsening sinus pain, fever or notice severe headache and vision changes, or if symptoms are not better after completion of antibiotic, please schedule an appointment with a health care provider.    Sinus infections are not as easily transmitted as other respiratory infection, however we still recommend that you avoid close contact with loved ones, especially the very young and elderly.  Remember to wash your hands thoroughly throughout the day as this is the number one way to prevent the spread of infection!  Home Care: Only take medications as instructed by your medical team. Complete the entire course of an antibiotic. Do not take these medications with alcohol. A steam or ultrasonic humidifier can help congestion.  You can place a towel over your head and breathe in the steam from hot water coming from a faucet. Avoid close contacts especially the very young and the elderly. Cover your mouth when you cough or sneeze. Always remember to wash your hands.  Get Help Right Away If: You develop worsening fever or sinus pain. You develop a severe head ache or visual changes. Your symptoms persist after you have completed your treatment plan.  Make sure you Understand these instructions. Will watch your  condition. Will get help right away if you are not doing well or get worse.  Thank you for choosing an e-visit.  Your e-visit answers were reviewed by a board certified advanced clinical practitioner to complete your personal care plan. Depending upon the condition, your plan could have included both over the counter or prescription medications.  Please review your pharmacy choice. Make sure the pharmacy is open so you can pick up prescription now. If there is a problem, you may contact your provider through MyChart messaging and have the prescription routed to another pharmacy.  Your safety is important to us. If you have drug allergies check your prescription carefully.   For the next 24 hours you can use MyChart to ask questions about today's visit, request a non-urgent call back, or ask for a work or school excuse. You will get an email in the next two days asking about your experience. I hope that your e-visit has been valuable and will speed your recovery.   Approximately 5 minutes was spent documenting and reviewing patient's chart.   

## 2023-11-15 ENCOUNTER — Encounter: Payer: Self-pay | Admitting: Internal Medicine

## 2023-11-15 ENCOUNTER — Other Ambulatory Visit: Payer: Self-pay

## 2023-11-15 ENCOUNTER — Other Ambulatory Visit (HOSPITAL_COMMUNITY): Payer: Self-pay

## 2023-11-15 ENCOUNTER — Ambulatory Visit: Admitting: Internal Medicine

## 2023-11-15 VITALS — BP 122/80 | HR 77 | Temp 98.6°F | Ht 63.0 in | Wt 204.0 lb

## 2023-11-15 DIAGNOSIS — F32A Depression, unspecified: Secondary | ICD-10-CM

## 2023-11-15 DIAGNOSIS — D234 Other benign neoplasm of skin of scalp and neck: Secondary | ICD-10-CM

## 2023-11-15 DIAGNOSIS — F419 Anxiety disorder, unspecified: Secondary | ICD-10-CM | POA: Diagnosis not present

## 2023-11-15 MED ORDER — NEOMYCIN-POLYMYXIN-HC 3.5-10000-1 OT SUSP
3.0000 [drp] | Freq: Three times a day (TID) | OTIC | 0 refills | Status: DC
  Filled 2023-11-15: qty 10, 8d supply, fill #0

## 2023-11-15 MED ORDER — ESCITALOPRAM OXALATE 10 MG PO TABS
10.0000 mg | ORAL_TABLET | Freq: Every day | ORAL | 1 refills | Status: AC
  Filled 2023-11-15: qty 90, 90d supply, fill #0
  Filled 2024-03-22: qty 90, 90d supply, fill #1

## 2023-11-15 NOTE — Patient Instructions (Addendum)
 We will try lexapro for anxiety and weight. Take 1/2 pill daily for first week. Then increase to 1 pill daily. Let us know in 3-4 weeks how you are doing or sooner if problems.  Call [facility] if you want to get tested for ADD/ADHD [address] [place], [place] [address] Phone: [phone]  We have sent in the ear drops to use 3 drops in each ear 3 times a day for 3 days

## 2023-11-15 NOTE — Progress Notes (Unsigned)
   Subjective:   Patient ID: Vanessa Byrd, female    DOB: 15-Aug-1976, 48 y.o.   MRN: [id number]  HPI The patient is a 48 YO female coming in for multiple concerns including attention and focus (going on for awhle but worse lately, never tested for ADD/ADHD) and multiple cysts on scalp and arm that she wants removed which are tender and growing.   Review of Systems  Constitutional: Negative.   HENT: Negative.    Eyes: Negative.   Respiratory:  Negative for cough, chest tightness and shortness of breath.   Cardiovascular:  Negative for chest pain, palpitations and leg swelling.  Gastrointestinal:  Negative for abdominal distention, abdominal pain, constipation, diarrhea, nausea and vomiting.  Musculoskeletal: Negative.   Skin: Negative.        cysts  Neurological: Negative.   Psychiatric/Behavioral:  Positive for decreased concentration and dysphoric mood.     Objective:  Physical Exam Constitutional:      Appearance: She is well-developed.  HENT:     Head: Normocephalic and atraumatic.  Cardiovascular:     Rate and Rhythm: Normal rate and regular rhythm.  Pulmonary:     Effort: Pulmonary effort is normal. No respiratory distress.     Breath sounds: Normal breath sounds. No wheezing or rales.  Abdominal:     General: Bowel sounds are normal. There is no distension.     Palpations: Abdomen is soft.     Tenderness: There is no abdominal tenderness. There is no rebound.  Musculoskeletal:     Cervical back: Normal range of motion.  Skin:    General: Skin is warm and dry.     Comments: Two cysts on scalp and one on trunk/arm  Neurological:     Mental Status: She is alert and oriented to person, place, and time.     Coordination: Coordination normal.     Vitals:   11/15/23 0816  BP: 122/80  Pulse: 77  Temp: 98.6 F (37 C)  TempSrc: Oral  SpO2: 97%  Weight: 204 lb (92.5 kg)  Height: 5\' 3"  (1.6 m)    Assessment & Plan:  Visit time 25 minutes in face to  face communication with patient and coordination of care, additional 10 minutes spent in record review, coordination or care, ordering tests, communicating/referring to other healthcare professionals, documenting in medical records all on the same day of the visit for total time 35 minutes spent on the visit.

## 2023-11-17 NOTE — Assessment & Plan Note (Signed)
 We discussed how this could be causing some of the attention problems. She should have formal add/adhd assessment and given information on how to schedule that.

## 2023-11-17 NOTE — Assessment & Plan Note (Signed)
 Wishes removal with derm. She has two on scalp which are growing and now tender with caring for hair.

## 2023-12-28 ENCOUNTER — Ambulatory Visit: Admitting: Dermatology

## 2023-12-28 ENCOUNTER — Encounter: Payer: Self-pay | Admitting: Dermatology

## 2023-12-28 VITALS — BP 131/84 | HR 82

## 2023-12-28 DIAGNOSIS — R2231 Localized swelling, mass and lump, right upper limb: Secondary | ICD-10-CM

## 2023-12-28 DIAGNOSIS — L814 Other melanin hyperpigmentation: Secondary | ICD-10-CM | POA: Diagnosis not present

## 2023-12-28 DIAGNOSIS — D229 Melanocytic nevi, unspecified: Secondary | ICD-10-CM

## 2023-12-28 DIAGNOSIS — D2239 Melanocytic nevi of other parts of face: Secondary | ICD-10-CM | POA: Diagnosis not present

## 2023-12-28 DIAGNOSIS — Z1283 Encounter for screening for malignant neoplasm of skin: Secondary | ICD-10-CM | POA: Diagnosis not present

## 2023-12-28 DIAGNOSIS — D1801 Hemangioma of skin and subcutaneous tissue: Secondary | ICD-10-CM

## 2023-12-28 DIAGNOSIS — L578 Other skin changes due to chronic exposure to nonionizing radiation: Secondary | ICD-10-CM

## 2023-12-28 DIAGNOSIS — L821 Other seborrheic keratosis: Secondary | ICD-10-CM

## 2023-12-28 DIAGNOSIS — W098XXA Fall on or from other playground equipment, initial encounter: Secondary | ICD-10-CM

## 2023-12-28 DIAGNOSIS — R229 Localized swelling, mass and lump, unspecified: Secondary | ICD-10-CM

## 2023-12-28 DIAGNOSIS — L7211 Pilar cyst: Secondary | ICD-10-CM

## 2023-12-28 NOTE — Patient Instructions (Addendum)
 Skin Education :  We counseled the patient regarding the following: Sun screen (SPF 30 or greater) should be applied during peak UV exposure (between 10am and 2pm) and reapplied after exercise or swimming.  The ABCDEs of melanoma were reviewed with the patient, and the importance of monthly self-examination of moles was emphasized. Should any moles change in shape or color, or itch, bleed or burn, pt will contact our office for evaluation sooner then their interval appointment.  Plan: Sunscreen Recommendations We recommended a broad spectrum sunscreen with a SPF of 30 or higher. SPF 30 sunscreens block approximately 97 percent of the sun's harmful rays. Sunscreens should be applied at least 15 minutes prior to expected sun exposure and then every 2 hours after that as long as sun exposure continues. If swimming or exercising sunscreen should be reapplied every 45 minutes to an hour after getting wet or sweating. One ounce, or the equivalent of a shot glass full of sunscreen, is adequate to protect the skin not covered by a bathing suit. We also recommended a lip balm with a sunscreen as well. Sun protective clothing can be used in lieu of sunscreen but must be worn the entire time you are exposed to the sun's rays.   Important Information  Due to recent changes in healthcare laws, you may see results of your pathology and/or laboratory studies on MyChart before the doctors have had a chance to review them. We understand that in some cases there may be results that are confusing or concerning to you. Please understand that not all results are received at the same time and often the doctors may need to interpret multiple results in order to provide you with the best plan of care or course of treatment. Therefore, we ask that you please give us  2 business days to thoroughly review all your results before contacting the office for clarification. Should we see a critical lab result, you will be contacted  sooner.   If You Need Anything After Your Visit  If you have any questions or concerns for your doctor, please call our main line at [phone] If no one answers, please leave a voicemail as directed and we will return your call as soon as possible. Messages left after 4 pm will be answered the following business day.   You may also send us  a message via MyChart. We typically respond to MyChart messages within 1-2 business days.  For prescription refills, please ask your pharmacy to contact our office. Our fax number is [phone].  If you have an urgent issue when the clinic is closed that cannot wait until the next business day, you can page your doctor at the number below.    Please note that while we do our best to be available for urgent issues outside of office hours, we are not available 24/7.   If you have an urgent issue and are unable to reach us , you may choose to seek medical care at your doctor's office, retail clinic, urgent care center, or emergency room.  If you have a medical emergency, please immediately call 911 or go to the emergency department. In the event of inclement weather, please call our main line at [phone] for an update on the status of any delays or closures.  Dermatology Medication Tips: Please keep the boxes that topical medications come in in order to help keep track of the instructions about where and how to use these. Pharmacies typically print the medication instructions only on the  boxes and not directly on the medication tubes.   If your medication is too expensive, please contact our office at [phone] or send us  a message through MyChart.   We are unable to tell what your co-pay for medications will be in advance as this is different depending on your insurance coverage. However, we may be able to find a substitute medication at lower cost or fill out paperwork to get insurance to cover a needed medication.   If a prior authorization is  required to get your medication covered by your insurance company, please allow us  1-2 business days to complete this process.  Drug prices often vary depending on where the prescription is filled and some pharmacies may offer cheaper prices.  The website www.goodrx.com contains coupons for medications through different pharmacies. The prices here do not account for what the cost may be with help from insurance (it may be cheaper with your insurance), but the website can give you the price if you did not use any insurance.  - You can print the associated coupon and take it with your prescription to the pharmacy.  - You may also stop by our office during regular business hours and pick up a GoodRx coupon card.  - If you need your prescription sent electronically to a different pharmacy, notify our office through [facility] or by phone at [phone]    Skin Education :   I counseled the patient regarding the following: Sun screen (SPF 30 or greater) should be applied during peak UV exposure (between 10am and 2pm) and reapplied after exercise or swimming.  The ABCDEs of melanoma were reviewed with the patient, and the importance of monthly self-examination of moles was emphasized. Should any moles change in shape or color, or itch, bleed or burn, pt will contact our office for evaluation sooner then their interval appointment.  Plan: Sunscreen Recommendations I recommended a broad spectrum sunscreen with a SPF of 30 or higher. I explained that SPF 30 sunscreens block approximately 97 percent of the sun's harmful rays. Sunscreens should be applied at least 15 minutes prior to expected sun exposure and then every 2 hours after that as long as sun exposure continues. If swimming or exercising sunscreen should be reapplied every 45 minutes to an hour after getting wet or sweating. One ounce, or the equivalent of a shot glass full of sunscreen, is adequate to protect the skin not covered by a  bathing suit. I also recommended a lip balm with a sunscreen as well. Sun protective clothing can be used in lieu of sunscreen but must be worn the entire time you are exposed to the sun's rays.

## 2023-12-28 NOTE — Progress Notes (Signed)
   New Patient Visit   Subjective  Vanessa Byrd is a 48 y.o. female who presents for the following: Skin Cancer Screening and Full Body Skin Exam. No personal or family history of skin cancer.   The patient presents for Total-Body Skin Exam (TBSE) for skin cancer screening and mole check. The patient has spots, moles and lesions to be evaluated, some may be new or changing.  The following portions of the chart were reviewed this encounter and updated as appropriate: medications, allergies, medical history  Review of Systems:  No other skin or systemic complaints except as noted in HPI or Assessment and Plan.  Objective  Well appearing patient in no apparent distress; mood and affect are within normal limits.  A full examination was performed including scalp, head, eyes, ears, nose, lips, neck, chest, axillae, abdomen, back, buttocks, bilateral upper extremities, bilateral lower extremities, hands, feet, fingers, toes, fingernails, and toenails. All findings within normal limits unless otherwise noted below.   Relevant physical exam findings are noted in the Assessment and Plan.    Assessment & Plan   SKIN CANCER SCREENING PERFORMED TODAY.  ACTINIC DAMAGE - Chronic condition, secondary to cumulative UV/sun exposure - diffuse scaly erythematous macules with underlying dyspigmentation - Recommend daily broad spectrum sunscreen SPF 30+ to sun-exposed areas, reapply every 2 hours as needed.  - Staying in the shade or wearing long sleeves, sun glasses (UVA+UVB protection) and wide brim hats (4-inch brim around the entire circumference of the hat) are also recommended for sun protection.  - Call for new or changing lesions.  LENTIGINES, SEBORRHEIC KERATOSES, HEMANGIOMAS - Benign normal skin lesions - Benign-appearing - Call for any changes  MELANOCYTIC NEVI  - Tan-brown and/or pink-flesh-colored symmetric macules and papules - Benign appearing on exam today -  Observation - Call clinic for new or changing moles - Recommend daily use of broad spectrum spf 30+ sunscreen to sun-exposed areas.   Pilar Cyst x 2- scalp Exam: Subcutaneous nodule mid frontal scalp and right scalp  Benign-appearing. Exam most consistent with a pilar cyst. Discussed that a cyst is a benign growth that can grow over time and sometimes get irritated or inflamed. Recommend observation if it is not bothersome. Discussed option of surgical excision to remove it if it is growing, symptomatic, or other changes noted. Please call for new or changing lesions so they can be evaluated.  Would like to schedule for excision  LENTIGINES bilateral upper back Exam: scattered tan macules Due to sun exposure Treatment Plan: Benign-appearing, observe. Recommend daily broad spectrum sunscreen SPF 30+ to sun-exposed areas, reapply every 2 hours as needed.  Call for any changes   Fibrous Papule left-alar crease - 1-2 mm smooth symmetric flesh colored to pink papule(s) without features suspicious for malignancy on dermoscopy - Benign-appearing.  Observation.  Call clinic for new or changing lesions.    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    Return for 2 WLE, scalp for pilar cyst and right upper arm for neoplasm, seperate appointments.  I, Haig Levan, Surg Tech III, am acting as scribe for Deneise Finlay, MD.   Documentation: I have reviewed the above documentation for accuracy and completeness, and I agree with the above.  Deneise Finlay, MD

## 2024-01-03 ENCOUNTER — Encounter: Payer: Self-pay | Admitting: Dermatology

## 2024-01-09 ENCOUNTER — Ambulatory Visit: Admitting: Dermatology

## 2024-01-09 ENCOUNTER — Encounter: Payer: Self-pay | Admitting: Dermatology

## 2024-01-09 VITALS — BP 136/83 | HR 75 | Temp 99.1°F

## 2024-01-09 DIAGNOSIS — D492 Neoplasm of unspecified behavior of bone, soft tissue, and skin: Secondary | ICD-10-CM

## 2024-01-09 DIAGNOSIS — L7211 Pilar cyst: Secondary | ICD-10-CM | POA: Diagnosis not present

## 2024-01-09 DIAGNOSIS — D485 Neoplasm of uncertain behavior of skin: Secondary | ICD-10-CM

## 2024-01-09 NOTE — Patient Instructions (Addendum)
 Wound Care Instructions for After Surgery  On the day following your surgery, you should begin doing daily dressing changes until your sutures are removed: Remove the bandage. Cleanse the wound gently with soap and water.  Make sure you then dry the skin surrounding the wound completely or the tape will not stick to the skin. Do not use cotton balls on the wound. After the wound is clean and dry, apply the ointment (either prescription antibiotic prescribed by your doctor or plain Vaseline if nothing was prescribed) gently with a Q-tip. If you are using a bandaid to cover: Apply a bandaid large enough to cover the entire wound. If you do not have a bandaid large enough to cover the wound OR if you are sensitive to bandaid adhesive: Cut a non-stick pad (such as Telfa) to fit the size of the wound.  Cover the wound with the non-stick pad. If the wound is draining, you may want to add a small amount of gauze on top of the non-stick pad for a little added compression to the area. Use tape to seal the area completely.  For the next 1-2 weeks: Be sure to keep the wound moist with ointment 24/7 to ensure best healing. If you are unable to cover the wound with a bandage to hold the ointment in place, you may need to reapply the ointment several times a day. Do not bend over or lift heavy items to reduce the chance of elevated blood pressure to the wound. Do not participate in particularly strenuous activities.  Below is a list of dressing supplies you might need.  Cotton-tipped applicators - Q-tips Gauze pads (2x2 and/or 4x4) - All-Purpose Sponges New and clean tube of petroleum jelly (Vaseline) OR prescription antibiotic ointment if prescribed Either a bandaid large enough to cover the entire wound OR non-stick dressing material (Telfa) and Tape (Paper or Hypafix)  FOR ADULT SURGERY PATIENTS: If you need something for pain relief, you may take 1 extra strength Tylenol (acetaminophen) and 2  ibuprofen (200 mg) together every 4 hours as needed. (Do not take these medications if you are allergic to them or if you know you cannot take them for any other reason). Typically you may only need pain medication for 1-3 days.   Comments on the Post-Operative Period Slight swelling and redness often appear around the wound. This is normal and will disappear within several days following the surgery. The healing wound will drain a brownish-red-yellow discharge during healing. This is a normal phase of wound healing. As the wound begins to heal, the drainage may increase in amount. Again, this drainage is normal. Notify us if the drainage becomes persistently bloody, excessively swollen, or intensely painful or develops a foul odor or red streaks.  The healing wound will also typically be itchy. This is normal. If you have severe or persistent pain, Notify us if the discomfort is severe or persistent. Avoid alcoholic beverages when taking pain medicine.  In Case of Wound Hemorrhage A wound hemorrhage is when the bandage suddenly becomes soaked with bright red blood and flows profusely. If this happens, sit down or lie down with your head elevated. If the wound has a dressing on it, do not remove the dressing. Apply pressure to the existing gauze. If the wound is not covered, use a gauze pad to apply pressure and continue applying the pressure for 20 minutes without peeking. DO NOT COVER THE WOUND WITH A LARGE TOWEL OR WASH CLOTH. Release your hand from  the wound site but do not remove the dressing. If the bleeding has stopped, gently clean around the wound. Leave the dressing in place for 24 hours if possible. This wait time allows the blood vessels to close off so that you do not spark a new round of bleeding by disrupting the newly clotted blood vessels with an immediate dressing change. If the bleeding does not subside, continue to hold pressure for 40 minutes. If bleeding continues, page your  physician, contact an After Hours clinic or go to the Emergency Room.   Important Information   Due to recent changes in healthcare laws, you may see results of your pathology and/or laboratory studies on MyChart before the doctors have had a chance to review them. We understand that in some cases there may be results that are confusing or concerning to you. Please understand that not all results are received at the same time and often the doctors may need to interpret multiple results in order to provide you with the best plan of care or course of treatment. Therefore, we ask that you please give [place] 2 business days to thoroughly review all your results before contacting the office for clarification. Should we see a critical lab result, you will be contacted sooner.     If You Need Anything After Your Visit   If you have any questions or concerns for your doctor, please call our main line at [phone]. If no one answers, please leave a voicemail as directed and we will return your call as soon as possible. Messages left after 4 pm will be answered the following business day.    You may also send [place] a message via MyChart. We typically respond to MyChart messages within 1-2 business days.  For prescription refills, please ask your pharmacy to contact our office. Our fax number is [phone].  If you have an urgent issue when the clinic is closed that cannot wait until the next business day, you can page your doctor at the number below.     Please note that while we do our best to be available for urgent issues outside of office hours, we are not available 24/7.    If you have an urgent issue and are unable to reach [place], you may choose to seek medical care at your doctor's office, retail clinic, urgent care center, or emergency room.   If you have a medical emergency, please immediately call 911 or go to the emergency department. In the event of inclement weather, please call our main line at  [phone] for an update on the status of any delays or closures.  Dermatology Medication Tips: Please keep the boxes that topical medications come in in order to help keep track of the instructions about where and how to use these. Pharmacies typically print the medication instructions only on the boxes and not directly on the medication tubes.   If your medication is too expensive, please contact our office at [phone] or send [place] a message through MyChart.    We are unable to tell what your co-pay for medications will be in advance as this is different depending on your insurance coverage. However, we may be able to find a substitute medication at lower cost or fill out paperwork to get insurance to cover a needed medication.    If a prior authorization is required to get your medication covered by your insurance company, please allow [place] 1-2 business days to complete this process.   Drug  prices often vary depending on where the prescription is filled and some pharmacies may offer cheaper prices.   The website www.goodrx.com contains coupons for medications through different pharmacies. The prices here do not account for what the cost may be with help from insurance (it may be cheaper with your insurance), but the website can give you the price if you did not use any insurance.  - You can print the associated coupon and take it with your prescription to the pharmacy.  - You may also stop by our office during regular business hours and pick up a GoodRx coupon card.  - If you need your prescription sent electronically to a different pharmacy, notify our office through [facility] or by phone at [phone]

## 2024-01-09 NOTE — Progress Notes (Signed)
 Follow-Up Visit   Subjective  Vanessa Byrd is a 48 y.o. female who presents for the following: Excision of a neoplasm of skin on the right shoulder.   She also has several lesion on her scalp that she will schedule for excision for.   The following portions of the chart were reviewed this encounter and updated as appropriate: medications, allergies, medical history  Review of Systems:  No other skin or systemic complaints except as noted in HPI or Assessment and Plan.  Objective  Well appearing patient in no apparent distress; mood and affect are within normal limits.  A focused examination was performed of the following areas: Right shoulder Relevant physical exam findings are noted in the Assessment and Plan.   Right Shoulder Firm subcutaneous nodule   Assessment & Plan   NEOPLASM OF UNCERTAIN BEHAVIOR OF SKIN Right Shoulder Skin excision  Excision method:  elliptical Lesion length (cm):  2.1 Lesion width (cm):  2.5 Margin per side (cm):  0.1 Total excision diameter (cm):  2.7 Informed consent: discussed and consent obtained   Timeout: patient name, date of birth, surgical site, and procedure verified   Procedure prep:  Patient was prepped and draped in usual sterile fashion Prep type:  Chlorhexidine Anesthesia: the lesion was anesthetized in a standard fashion   Anesthetic:  1% lidocaine  w/ epinephrine 1-100,000 buffered w/ 8.4% NaHCO3 Instrument used: #15 blade   Hemostasis achieved with: suture, pressure and electrodesiccation   Outcome: patient tolerated procedure well with no complications   Post-procedure details: sterile dressing applied and wound care instructions given   Dressing type: bandage and pressure dressing    Skin repair Complexity:  Complex Final length (cm):  3.2 Informed consent: discussed and consent obtained   Timeout: patient name, date of birth, surgical site, and procedure verified   Procedure prep:  Patient was prepped  and draped in usual sterile fashion Prep type:  Chlorhexidine Anesthesia: the lesion was anesthetized in a standard fashion   Anesthetic:  1% lidocaine  w/ epinephrine 1-100,000 buffered w/ 8.4% NaHCO3 Reason for type of repair: reduce tension to allow closure, preserve normal anatomy and avoid adjacent structures   Undermining: area extensively undermined   Subcutaneous layers (deep stitches):  Suture size:  3-0 Suture type: PDS (polydioxanone)   Stitches:  Buried vertical mattress Fine/surface layer approximation (top stitches):  Suture type: cyanoacrylate tissue glue   Hemostasis achieved with: suture, pressure and electrodesiccation Outcome: patient tolerated procedure well with no complications   Post-procedure details: sterile dressing applied and wound care instructions given   Dressing type: bandage and pressure dressing   Specimen 1 - Surgical pathology Differential Diagnosis: R/O neoplasm vs pilomatricoma  Check Margins: yes PILAR CYST    The surgical wound was then cleaned, prepped, and re-anesthetized as above. Wound edges were undermined extensively along at least one entire edge and at a distance equal to or greater than the width of the defect (see wound defect size above) in order to achieve closure and decrease wound tension and anatomic distortion. Redundant tissue repair including standing cone removal was performed. Hemostasis was achieved with electrocautery. Subcutaneous and epidermal tissues were approximated with the above sutures. The surgical site was then lightly scrubbed with sterile, saline-soaked gauze. Steri-strips were applied, and the area was then bandaged using Vaseline ointment, non-adherent gauze, gauze pads, and tape to provide an adequate pressure dressing. The patient tolerated the procedure well, was given detailed written and verbal wound care instructions, and was discharged in good  condition.   The patient will follow-up: 2 weeks for WLE on  scalp.  Pilar Cyst- Right parietal scalp Exam: Subcutaneous nodule.  Benign-appearing. Exam most consistent with a pilar cyst. Discussed that a cyst is a benign growth that can grow over time and sometimes get irritated or inflamed. Recommend observation if it is not bothersome. Discussed option of surgical excision to remove it if it is growing, symptomatic, or other changes noted. Please call for new or changing lesions so they can be evaluated.   Return in 15 days (on 01/24/2024) for for cyst WLE.  I, Haig Levan, Surg Tech III, am acting as scribe for Deneise Finlay, MD.   Documentation: I have reviewed the above documentation for accuracy and completeness, and I agree with the above.  Deneise Finlay, MD

## 2024-01-11 LAB — SURGICAL PATHOLOGY

## 2024-01-12 ENCOUNTER — Ambulatory Visit: Payer: Self-pay | Admitting: Dermatology

## 2024-01-24 ENCOUNTER — Ambulatory Visit: Admitting: Dermatology

## 2024-02-13 ENCOUNTER — Encounter: Payer: Self-pay | Admitting: Dermatology

## 2024-02-20 ENCOUNTER — Ambulatory Visit: Admitting: Dermatology

## 2024-04-21 IMAGING — US US PELVIS COMPLETE WITH TRANSVAGINAL
1 series · 14 of 25 positions shown · non-contrast
Comparison: Pelvic ultrasound 07/29/2020.

CLINICAL DATA: Pelvic pain, uterine fibroid.

EXAM:
TRANSABDOMINAL AND TRANSVAGINAL ULTRASOUND OF PELVIS
TECHNIQUE: Both transabdominal and transvaginal ultrasound examinations of the
pelvis were performed. Transabdominal technique was performed for
global imaging of the pelvis including uterus, ovaries, adnexal
regions, and pelvic cul-de-sac. It was necessary to proceed with
endovaginal exam following the transabdominal exam to visualize the
ovaries and endometrium.

[Series 1: us pelvic complete with transvaginal · 14 of 69 slices shown]
[im 1/69]
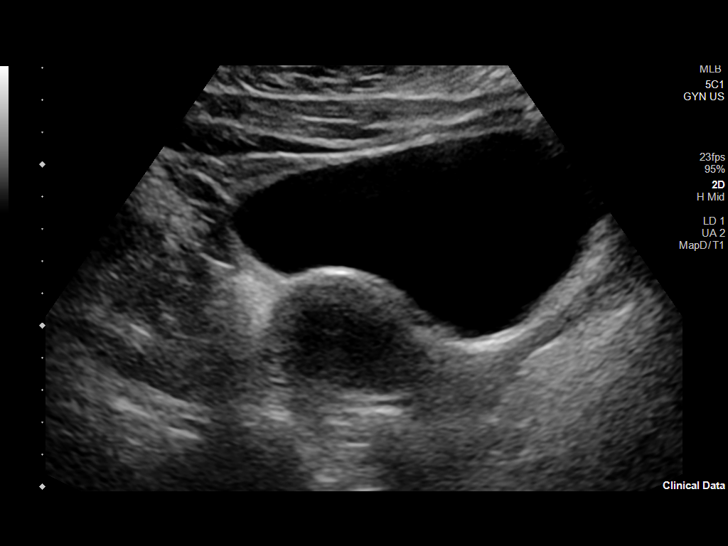
[im 6/69]
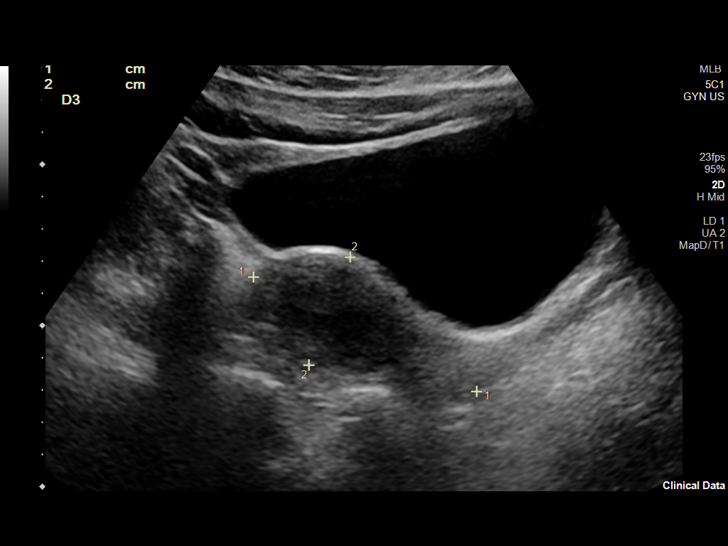
[im 12/69]
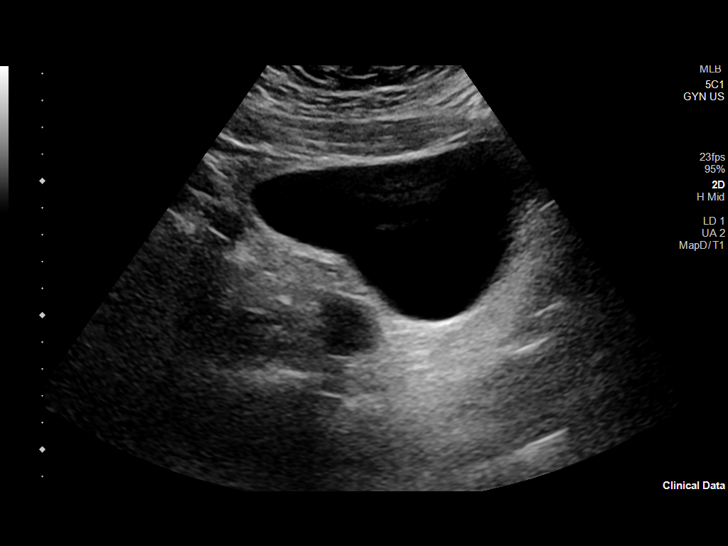
[im 18/69]
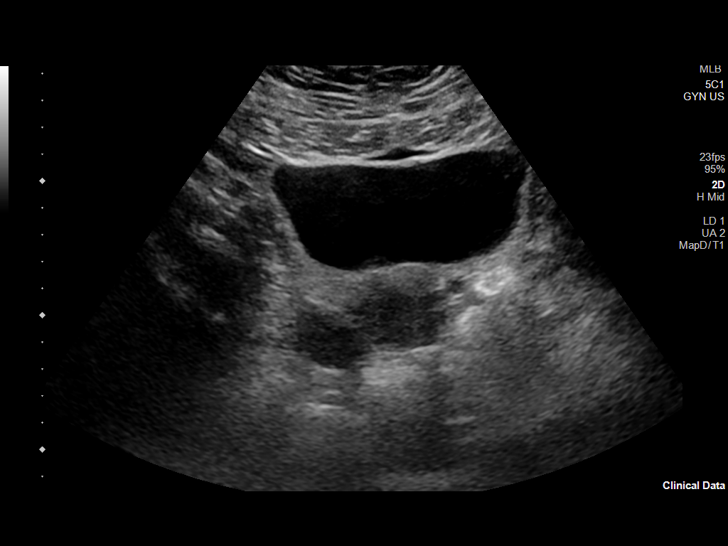
[im 23/69]
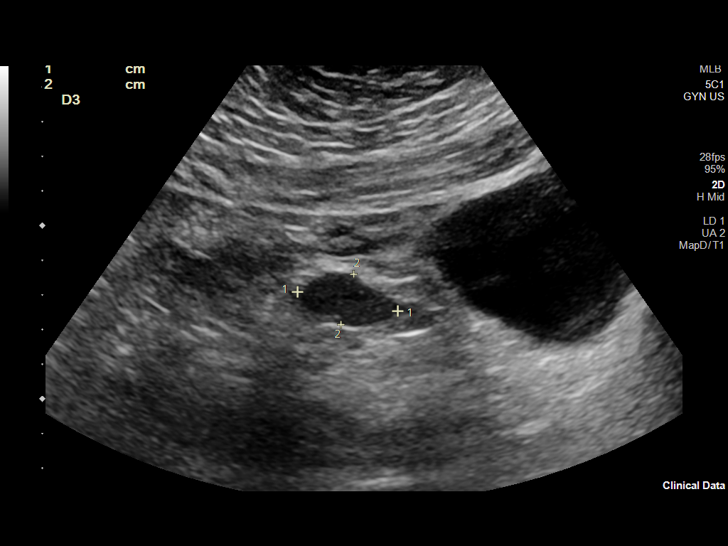
[im 26/69]
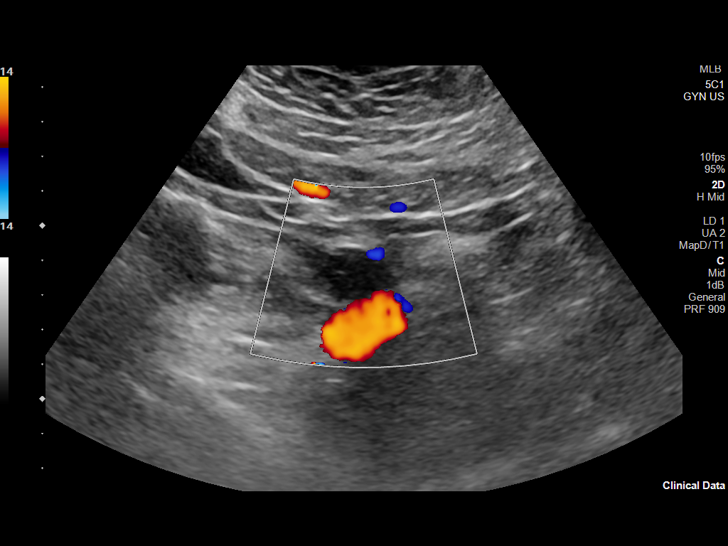
[im 32/69]
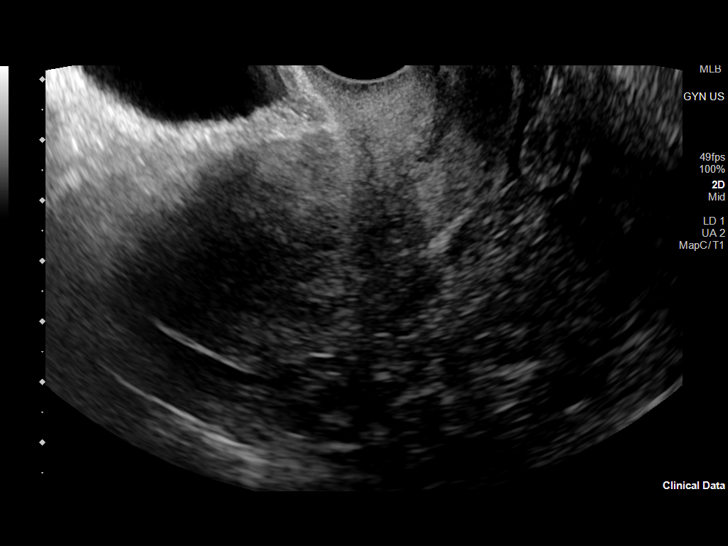
[im 37/69]
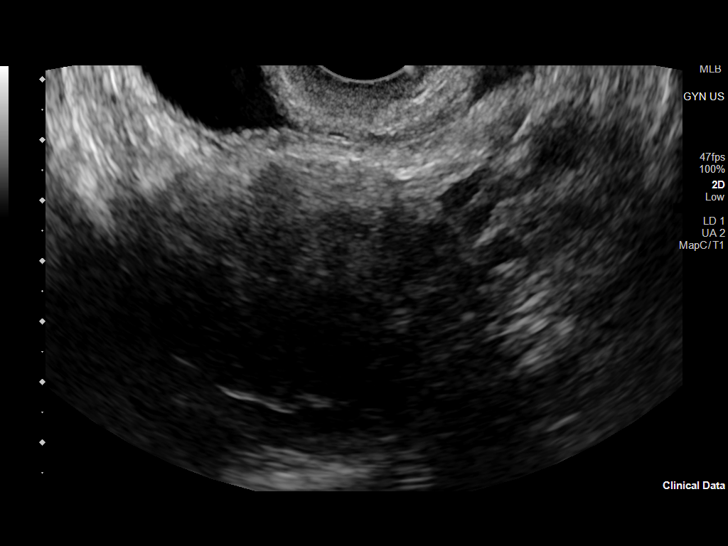
[im 43/69]
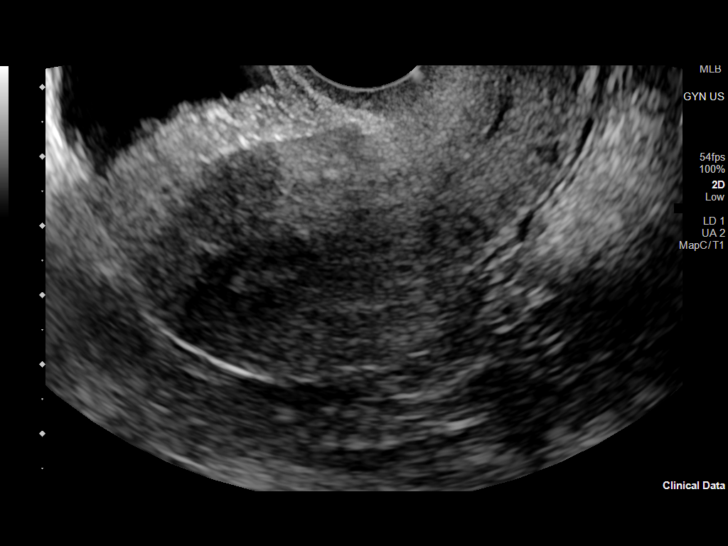
[im 46/69]
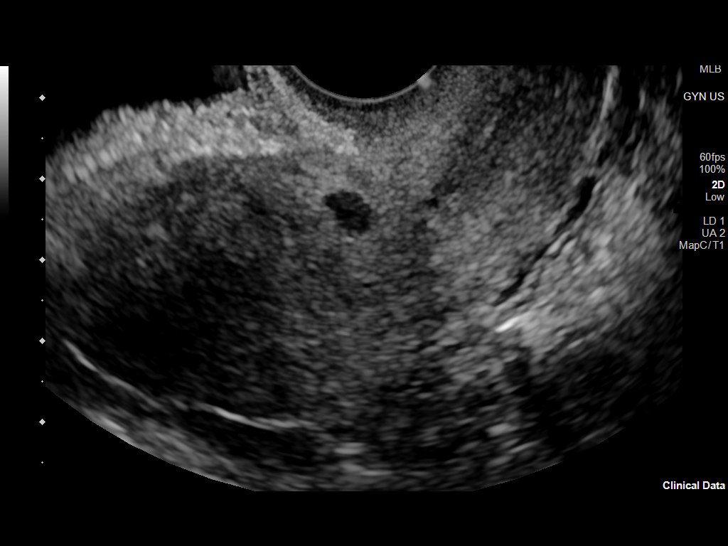
[im 52/69]
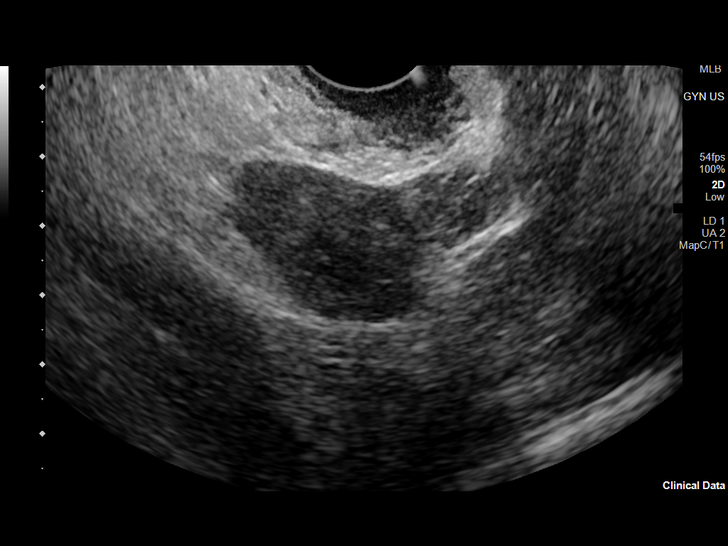
[im 57/69]
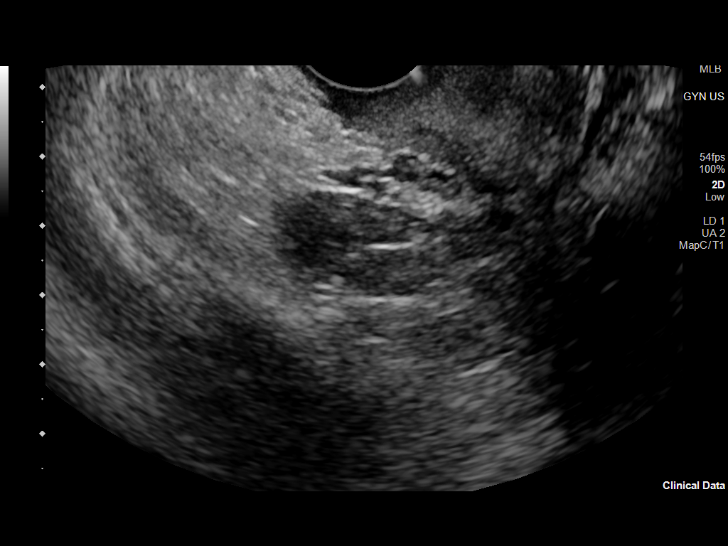
[im 63/69]
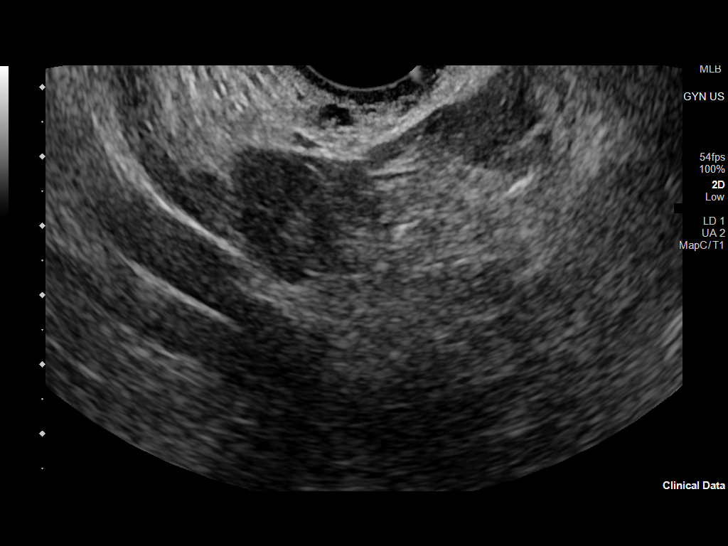
[im 69/69]
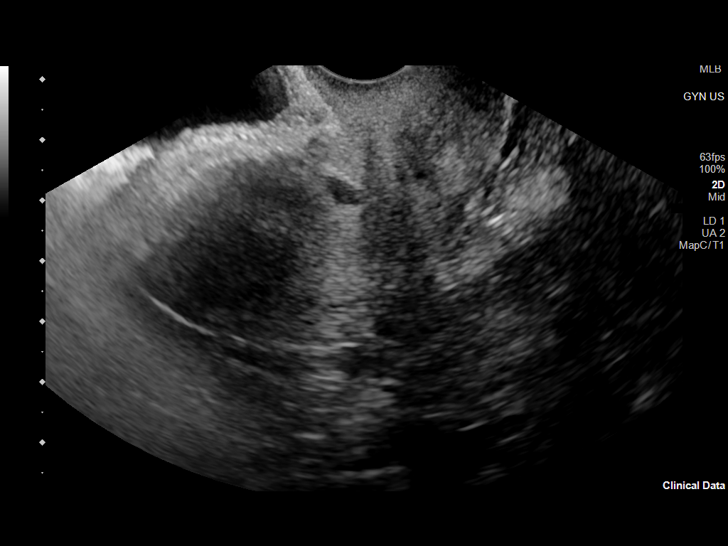

[14 of 25 positions shown; findings below may reference images not displayed]

FINDINGS: Uterus

Measurements: 7.8 x 3.6 x 5.1 cm = volume: 75 mL. There is an 8 x 4
x 7 mm hypoechoic area in the anterior body of the uterus favored as
intramural fibroid. This previously measured 14 mm. No new focal
myometrial lesions are seen.

Endometrium

Thickness: 7 mm.  No focal abnormality visualized.

Right ovary

Measurements: 3.2 x 2.1 x 1.8 cm = volume: 6.1 mL. Normal
appearance/no adnexal mass.

Left ovary

Measurements: 2.4 x 1.7 x 2.0 cm = volume: 4.3 mL. Normal
appearance/no adnexal mass.

Other findings

No abnormal free fluid.
IMPRESSION: Single small intramural fibroid has decreased in size.

## 2024-04-25 ENCOUNTER — Encounter: Payer: Self-pay | Admitting: Dermatology

## 2024-04-25 ENCOUNTER — Ambulatory Visit: Admitting: Dermatology

## 2024-04-25 VITALS — BP 160/83 | HR 79 | Temp 98.1°F

## 2024-04-25 DIAGNOSIS — D485 Neoplasm of uncertain behavior of skin: Secondary | ICD-10-CM

## 2024-04-25 DIAGNOSIS — L7211 Pilar cyst: Secondary | ICD-10-CM

## 2024-04-25 DIAGNOSIS — D492 Neoplasm of unspecified behavior of bone, soft tissue, and skin: Secondary | ICD-10-CM

## 2024-04-25 NOTE — Patient Instructions (Signed)

## 2024-04-25 NOTE — Progress Notes (Signed)
 Follow-Up Visit   Subjective  Vanessa Byrd is a 48 y.o. female who presents for the following: Excision of a subcutaneous nodule on the scalp.  The following portions of the chart were reviewed this encounter and updated as appropriate: medications, allergies, medical history  Review of Systems:  No other skin or systemic complaints except as noted in HPI or Assessment and Plan.  Objective  Well appearing patient in no apparent distress; mood and affect are within normal limits.  A focused examination was performed of the following areas: scalp Relevant physical exam findings are noted in the Assessment and Plan.   Mid Frontal Scalp Subcutaneous nodule   Assessment & Plan   NEOPLASM OF UNCERTAIN BEHAVIOR OF SKIN Mid Frontal Scalp Skin excision  Excision method:  elliptical Lesion length (cm):  3 Lesion width (cm):  2 Margin per side (cm):  0.1 Total excision diameter (cm):  3.2 Informed consent: discussed and consent obtained   Timeout: patient name, date of birth, surgical site, and procedure verified   Procedure prep:  Patient was prepped and draped in usual sterile fashion Prep type:  Chlorhexidine Anesthesia: the lesion was anesthetized in a standard fashion   Anesthetic:  1% lidocaine  w/ epinephrine 1-100,000 buffered w/ 8.4% NaHCO3 Instrument used: #15 blade   Hemostasis achieved with: electrodesiccation   Outcome: patient tolerated procedure well with no complications   Post-procedure details: sterile dressing applied and wound care instructions given   Dressing type: bandage and pressure dressing    Skin repair Complexity:  Complex Final length (cm):  4.8 Informed consent: discussed and consent obtained   Timeout: patient name, date of birth, surgical site, and procedure verified   Procedure prep:  Patient was prepped and draped in usual sterile fashion Prep type:  Chlorhexidine Anesthesia: the lesion was anesthetized in a standard fashion    Anesthetic:  1% lidocaine  w/ epinephrine 1-100,000 buffered w/ 8.4% NaHCO3 Reason for type of repair: reduce the risk of dehiscence, infection, and necrosis   Undermining: area extensively undermined   Subcutaneous layers (deep stitches):  Suture size:  3-0 Suture type: Vicryl (polyglactin 910)   Stitches:  Buried vertical mattress Fine/surface layer approximation (top stitches):  Suture type comment:  Staples Hemostasis achieved with: suture, pressure and electrodesiccation Outcome: patient tolerated procedure well with no complications   Post-procedure details: sterile dressing applied and wound care instructions given   Dressing type: pressure dressing and petrolatum    Specimen 1 - Surgical pathology Differential Diagnosis: R/O cyst vs lipoma vs other  Check Margins: No  The surgical wound was then cleaned, prepped, and re-anesthetized as above. Wound edges were undermined extensively along at least one entire edge and at a distance equal to or greater than the width of the defect (see wound defect size above) in order to achieve closure and decrease wound tension and anatomic distortion. Redundant tissue repair including standing cone removal was performed. Hemostasis was achieved with electrocautery. Subcutaneous and epidermal tissues were approximated with the above sutures. The surgical site was then lightly scrubbed with sterile, saline-soaked gauze. The area was then bandaged using Vaseline ointment, non-adherent gauze, gauze pads, and tape to provide an adequate pressure dressing. The patient tolerated the procedure well, was given detailed written and verbal wound care instructions, and was discharged in good condition.   The patient will follow-up: PRN.  Return in about 2 weeks (around 05/09/2024) for staple removal.  I, Rollene Gobble, RN, am acting as scribe for RUFUS CHRISTELLA HOLY, MD .  Documentation: I have reviewed the above documentation for accuracy and completeness,  and I agree with the above.  RUFUS CHRISTELLA HOLY, MD

## 2024-04-30 LAB — SURGICAL PATHOLOGY

## 2024-05-01 ENCOUNTER — Ambulatory Visit: Payer: Self-pay | Admitting: Dermatology

## 2024-05-09 ENCOUNTER — Encounter: Payer: Self-pay | Admitting: Dermatology

## 2024-05-09 ENCOUNTER — Other Ambulatory Visit (HOSPITAL_COMMUNITY): Payer: Self-pay

## 2024-05-09 ENCOUNTER — Ambulatory Visit: Admitting: Dermatology

## 2024-05-09 VITALS — BP 120/84 | HR 77

## 2024-05-09 DIAGNOSIS — Z48817 Encounter for surgical aftercare following surgery on the skin and subcutaneous tissue: Secondary | ICD-10-CM

## 2024-05-09 DIAGNOSIS — L723 Sebaceous cyst: Secondary | ICD-10-CM

## 2024-05-09 DIAGNOSIS — L720 Epidermal cyst: Secondary | ICD-10-CM | POA: Diagnosis not present

## 2024-05-09 DIAGNOSIS — L905 Scar conditions and fibrosis of skin: Secondary | ICD-10-CM | POA: Diagnosis not present

## 2024-05-09 MED ORDER — DOXYCYCLINE HYCLATE 100 MG PO TABS
100.0000 mg | ORAL_TABLET | Freq: Two times a day (BID) | ORAL | 0 refills | Status: AC
  Filled 2024-05-09: qty 14, 7d supply, fill #0

## 2024-05-09 NOTE — Patient Instructions (Signed)

## 2024-05-09 NOTE — Progress Notes (Signed)
   Follow-Up Visit   Subjective  Vanessa Byrd is a 48 y.o. female who presents for the following: Patient here for a wound check and staple removal s/p cyst removal on the scalp.  She also mentions a tender nodule on her right occipital scalp, present for several days, not previously treated.   The following portions of the chart were reviewed this encounter and updated as appropriate: medications, allergies, medical history  Review of Systems:  No other skin or systemic complaints except as noted in HPI or Assessment and Plan.  Objective  Well appearing patient in no apparent distress; mood and affect are within normal limits.  A focused examination was performed of the following areas: Scalp  Relevant exam findings are noted in the Assessment and Plan.    Assessment & Plan   Encounter for Removal of Sutures - Incision site at the scalp is clean, dry and intact - Wound cleansed  - Discussed pathology results showing Cyst  - Scars remodel for a full year. - Patient advised to call with any concerns or if they notice any new or changing lesions.   Scar s/p WLE for cyst on the scalp, repaired with linear closure - Reassured that wound has healed well - Discussed that scars take up to 12 months to mature from the date of surgery - Recommend SPF 30+ to scar daily to prevent purple color - OK to start scar massage at 4-6 weeks post-op - Can consider silicone based products for scar healing  EPIDERMAL INCLUSION CYST Exam: Subcutaneous nodule at right occipital scalp  Benign-appearing. Exam most consistent with an epidermal inclusion cyst. Discussed that a cyst is a benign growth that can grow over time and sometimes get irritated or inflamed. Recommend observation if it is not bothersome. Recommend doxycycline  100 mg BID x 7 days for potential inflamed cyst   Return if symptoms worsen or fail to improve.  I, Berwyn Lesches, Surg Tech III, am acting as scribe for  RUFUS CHRISTELLA HOLY, MD.   Documentation: I have reviewed the above documentation for accuracy and completeness, and I agree with the above.  RUFUS CHRISTELLA HOLY, MD

## 2024-05-21 ENCOUNTER — Ambulatory Visit: Admitting: Internal Medicine

## 2024-05-21 ENCOUNTER — Encounter (HOSPITAL_COMMUNITY): Payer: Self-pay

## 2024-05-21 ENCOUNTER — Encounter: Payer: Self-pay | Admitting: Internal Medicine

## 2024-05-21 ENCOUNTER — Other Ambulatory Visit (HOSPITAL_COMMUNITY): Payer: Self-pay

## 2024-05-21 DIAGNOSIS — F419 Anxiety disorder, unspecified: Secondary | ICD-10-CM

## 2024-05-21 DIAGNOSIS — F32A Depression, unspecified: Secondary | ICD-10-CM | POA: Diagnosis not present

## 2024-05-21 DIAGNOSIS — Z6837 Body mass index (BMI) 37.0-37.9, adult: Secondary | ICD-10-CM

## 2024-05-21 MED ORDER — SEMAGLUTIDE-WEIGHT MANAGEMENT 1 MG/0.5ML ~~LOC~~ SOAJ
1.0000 mg | SUBCUTANEOUS | 0 refills | Status: DC
Start: 2024-07-18 — End: 2024-07-23
  Filled 2024-05-21: qty 2, 28d supply, fill #0

## 2024-05-21 MED ORDER — SEMAGLUTIDE-WEIGHT MANAGEMENT 1.7 MG/0.75ML ~~LOC~~ SOAJ
1.7000 mg | SUBCUTANEOUS | 0 refills | Status: DC
  Filled 2024-05-21: qty 3, 28d supply, fill #0

## 2024-05-21 MED ORDER — SEMAGLUTIDE-WEIGHT MANAGEMENT 0.5 MG/0.5ML ~~LOC~~ SOAJ
0.5000 mg | SUBCUTANEOUS | 0 refills | Status: AC
  Filled 2024-05-21: qty 2, 28d supply, fill #0

## 2024-05-21 MED ORDER — SEMAGLUTIDE-WEIGHT MANAGEMENT 0.25 MG/0.5ML ~~LOC~~ SOAJ
0.2500 mg | SUBCUTANEOUS | 0 refills | Status: AC
  Filled 2024-05-21 – 2024-05-24 (×2): qty 2, 28d supply, fill #0

## 2024-05-21 MED ORDER — SEMAGLUTIDE-WEIGHT MANAGEMENT 2.4 MG/0.75ML ~~LOC~~ SOAJ
2.4000 mg | SUBCUTANEOUS | 0 refills | Status: DC
  Filled 2024-05-21: qty 3, 28d supply, fill #0

## 2024-05-21 NOTE — Patient Instructions (Signed)
 We have sent in the wegovy  to start.   We will have you take 1/2 pill daily of the lexapro  to see if this helps.

## 2024-05-21 NOTE — Progress Notes (Signed)
 Subjective:   Patient ID: Vanessa Byrd, female    DOB: 08/04/1976, 48 y.o.   MRN: [id number]  Discussed the use of AI scribe software for clinical note transcription with the patient, who gave verbal consent to proceed.  History of Present Illness Vanessa Byrd is a 48 year old female who presents with feelings of emotional numbness and lack of motivation.  She describes a significant change in her emotional state, feeling emotionally numb and lacking motivation. Her current medication initially improved her symptoms, but she now feels emotionally flat and unmotivated. She has been on this medication for a while, starting at a lower dose which initially made her feel good and motivated.  She experiences headaches more frequently, attributing them to sinus issues. The headaches are described as a band-like sensation in the front of her head and are not related to her recent cyst removal from her hand.  She has previously discussed weight management options and has lost some weight through gym activities. She is interested in exploring medications that could assist with weight loss, noting her husband's positive results with Ozempic  for diabetes management.  There is a mention of potential ADD, as she sometimes feels unable to complete tasks, which may contribute to her anxiety. However, she has not been formally tested for ADD.  No nausea or other significant side effects from her current medication.  Review of Systems  Constitutional: Negative.   HENT: Negative.    Eyes: Negative.   Respiratory:  Negative for cough, chest tightness and shortness of breath.   Cardiovascular:  Negative for chest pain, palpitations and leg swelling.  Gastrointestinal:  Negative for abdominal distention, abdominal pain, constipation, diarrhea, nausea and vomiting.  Musculoskeletal: Negative.   Skin: Negative.   Neurological: Negative.   Psychiatric/Behavioral: Negative.       Objective:  Physical Exam Constitutional:      Appearance: She is well-developed.  HENT:     Head: Normocephalic and atraumatic.  Cardiovascular:     Rate and Rhythm: Normal rate and regular rhythm.  Pulmonary:     Effort: Pulmonary effort is normal. No respiratory distress.     Breath sounds: Normal breath sounds. No wheezing or rales.  Abdominal:     General: Bowel sounds are normal. There is no distension.     Palpations: Abdomen is soft.     Tenderness: There is no abdominal tenderness.  Musculoskeletal:     Cervical back: Normal range of motion.  Skin:    General: Skin is warm and dry.  Neurological:     Mental Status: She is alert and oriented to person, place, and time.     Coordination: Coordination normal.     Vitals:   05/21/24 0833  BP: 128/84  Pulse: 72  Resp: 16  SpO2: 98%  Weight: 209 lb 3.2 oz (94.9 kg)  Height: 5' 3 (1.6 m)    Assessment and Plan Assessment & Plan Depression   Current medication may be too strong, causing apathy and lack of motivation without major side effects. Reduce medication to half a pill at night and monitor response for two weeks. If effective, will send new rx fora 5 mg dose. Follow up in two weeks to assess dose reduction effectiveness.  Morbid Obesity   She is interested in Wegovy  for weight management, which was previously discussed and is now more available. She has experienced some weight loss with lifestyle changes. Prescribe Wegovy , contingent on insurance coverage. Monitor weight loss progress and medication  tolerance.

## 2024-05-21 NOTE — Assessment & Plan Note (Signed)
 She is interested in Wegovy  for weight management, which was previously discussed and is now more available. She has experienced some weight loss with lifestyle changes. Prescribe Wegovy , contingent on insurance coverage. Monitor weight loss progress and medication tolerance.She will also continue her diet and exercise changes.

## 2024-05-21 NOTE — Assessment & Plan Note (Signed)
 Current medication may be too strong, causing apathy and lack of motivation without major side effects. Reduce medication to half a pill at night and monitor response for two weeks. If effective, will send new rx fora 5 mg dose. Follow up in two weeks to assess dose reduction effectiveness.

## 2024-05-24 ENCOUNTER — Other Ambulatory Visit (HOSPITAL_COMMUNITY): Payer: Self-pay

## 2024-05-24 ENCOUNTER — Encounter: Payer: Self-pay | Admitting: Internal Medicine

## 2024-06-04 ENCOUNTER — Other Ambulatory Visit (HOSPITAL_COMMUNITY): Payer: Self-pay

## 2024-06-21 ENCOUNTER — Other Ambulatory Visit (HOSPITAL_COMMUNITY): Payer: Self-pay

## 2024-06-21 MED ORDER — FLUZONE 0.5 ML IM SUSY
0.5000 mL | PREFILLED_SYRINGE | Freq: Once | INTRAMUSCULAR | 0 refills | Status: AC
  Filled 2024-06-21: qty 0.5, 1d supply, fill #0

## 2024-07-11 NOTE — Progress Notes (Unsigned)
 [facility] [address] [place] [address] Phone: [phone]   Assessment and Plan:     1. Acute pain of left knee (Primary) 2. Primary osteoarthritis of left knee - Chronic with exacerbation, initial visit - Most consistent with flare of mild medial compartment osteoarthritis based on HPI, physical exam, x-ray imaging - X-ray obtained in clinic.  My interpretation: No acute fracture or dislocation.  Mildly decreased medial joint space - Start HEP for knee - Start meloxicam  15 mg daily x2 weeks.  If still having pain after 2 weeks, complete 3rd-week of NSAID. May use remaining NSAID as needed once daily for pain control.  Do not to use additional over-the-counter NSAIDs (ibuprofen , naproxen , Advil , Aleve , etc.) while taking prescription NSAIDs.  May use Tylenol  [phone] mg 2 to 3 times a day for breakthrough pain.  15 additional minutes spent for educating Therapeutic Home Exercise Program.  This included exercises focusing on stretching, strengthening, with focus on eccentric aspects.   Long term goals include an improvement in range of motion, strength, endurance as well as avoiding reinjury. Patient's frequency would include in 1-2 times a day, 3-5 times a week for a duration of 6-12 weeks. Proper technique shown and discussed handout in great detail with ATC.  All questions were discussed and answered.    Pertinent previous records reviewed include none   Follow Up: 4 weeks for reevaluation.  Could consider physical therapy versus [facility]   Subjective:   I, Apostolos Blagg, am serving as a neurosurgeon for Doctor Morene Mace  Chief Complaint: knee pain   HPI:   07/12/2024 Patient is a 48 year old female with knee pain. Patient states left knee pain . 2 months ago she was playing badminton. Has noticed a twinge. 2 weeks ago her knee was swollen. Decreased ROM. Swelling has gone down with RICES. Pain is increasing as she goes.  Pain going down steps. Pain is anterior knee. States she feels like knee needs to pop.  No numbness or tingling. Does endorse antalgic gait   Relevant Historical Information: Prediabetes, GERD  Additional pertinent review of systems negative.   Current Outpatient Medications:    meloxicam  (MOBIC ) 15 MG tablet, Take 1 tablet (15 mg total) by mouth daily for 2 weeks.  If still in pain after 2 weeks, take 1 tablet daily for an additional 1 week., Disp: 30 tablet, Rfl: 0   escitalopram  (LEXAPRO ) 10 MG tablet, Take 1 tablet (10 mg total) by mouth daily., Disp: 90 tablet, Rfl: 1   fluticasone  (FLONASE ) 50 MCG/ACT nasal spray, Place 2 sprays into both nostrils daily., Disp: 16 g, Rfl: 0   levothyroxine  (SYNTHROID ) 50 MCG tablet, Take 1 tablet (50 mcg total) by mouth daily. (Patient not taking: Reported on 05/21/2024), Disp: 90 tablet, Rfl: 3   Multiple Vitamin (MULTIVITAMIN) tablet, Take 1 tablet by mouth daily., Disp: , Rfl:    semaglutide -weight management (WEGOVY ) 0.5 MG/0.5ML SOAJ SQ injection, Inject 0.5 mg into the skin once a week for 28 days., Disp: 2 mL, Rfl: 0   [START ON 07/18/2024] semaglutide -weight management (WEGOVY ) 1 MG/0.5ML SOAJ SQ injection, Inject 1 mg into the skin once a week for 28 days., Disp: 2 mL, Rfl: 0   [START ON 08/16/2024] semaglutide -weight management (WEGOVY ) 1.7 MG/0.75ML SOAJ SQ injection, Inject 1.7 mg into the skin once a week for 28 days., Disp: 3 mL, Rfl: 0   [START ON 09/14/2024] semaglutide -weight management (WEGOVY ) 2.4 MG/0.75ML SOAJ SQ  injection, Inject 2.4 mg into the skin once a week for 28 days., Disp: 3 mL, Rfl: 0   Objective:     Vitals:   07/12/24 0934  BP: 130/82  Pulse: 68  SpO2: 98%  Weight: 207 lb (93.9 kg)  Height: 5' 3 (1.6 m)      Body mass index is 36.67 kg/m.    Physical Exam:    General:  awake, alert oriented, no acute distress nontoxic Skin: no suspicious lesions or rashes Neuro:sensation intact and strength 5/5 with no  deficits, no atrophy, normal muscle tone Psych: No signs of anxiety, depression or other mood disorder  Left knee: No swelling No deformity Neg fluid wave, joint milking ROM Flex 110, Ext 0 TTP medial joint line NTTP over the quad tendon, medial fem condyle, lat fem condyle, patella, patella tendon, tibial tuberostiy, fibular head, posterior fossa, pes anserine bursa, gerdy's tubercle,   lateral jt line Neg anterior and posterior drawer Neg lachman Negative varus stress Negative valgus stress Negative McMurray Positive Thessaly  Gait normal    Electronically signed by:  Odis Mace D.[facility] 9:56 AM 07/12/24

## 2024-07-12 ENCOUNTER — Ambulatory Visit (INDEPENDENT_AMBULATORY_CARE_PROVIDER_SITE_OTHER)

## 2024-07-12 ENCOUNTER — Other Ambulatory Visit (HOSPITAL_COMMUNITY): Payer: Self-pay

## 2024-07-12 ENCOUNTER — Ambulatory Visit: Admitting: Sports Medicine

## 2024-07-12 VITALS — BP 130/82 | HR 68 | Ht 63.0 in | Wt 207.0 lb

## 2024-07-12 DIAGNOSIS — M25562 Pain in left knee: Secondary | ICD-10-CM

## 2024-07-12 DIAGNOSIS — M1712 Unilateral primary osteoarthritis, left knee: Secondary | ICD-10-CM

## 2024-07-12 MED ORDER — MELOXICAM 15 MG PO TABS
15.0000 mg | ORAL_TABLET | Freq: Every day | ORAL | 0 refills | Status: AC
  Filled 2024-07-12: qty 30, 30d supply, fill #0

## 2024-07-12 NOTE — Patient Instructions (Signed)
-   Start meloxicam 15 mg daily x2 weeks.  If still having pain after 2 weeks, complete 3rd-week of NSAID. May use remaining NSAID as needed once daily for pain control.  Do not to use additional over-the-counter NSAIDs (ibuprofen, naproxen, Advil, Aleve, etc.) while taking prescription NSAIDs.  May use Tylenol [phone] mg 2 to 3 times a day for breakthrough pain. Knee HEP  4 week follow up

## 2024-07-15 ENCOUNTER — Ambulatory Visit: Payer: Self-pay | Admitting: Sports Medicine

## 2024-07-15 ENCOUNTER — Telehealth: Admitting: Emergency Medicine

## 2024-07-15 DIAGNOSIS — R051 Acute cough: Secondary | ICD-10-CM

## 2024-07-15 MED ORDER — PSEUDOEPH-BROMPHEN-DM 30-2-10 MG/5ML PO SYRP: 5.0000 mL | ORAL_SOLUTION | Freq: Four times a day (QID) | ORAL | 0 refills | Status: DC | PRN

## 2024-07-15 NOTE — Progress Notes (Signed)
 We are sorry that you are not feeling well.  Here is how we plan to help!  Based on your presentation I believe you most likely have A cough due to a virus.  This is called viral bronchitis and is best treated by rest, plenty of fluids and control of the cough.  You may use Ibuprofen  or Tylenol  as directed to help your symptoms.    It could also be a cough due to allergies. I have prescribed a cough medicine that has a decongestant, antihistamine, and cough suppressant in it to try.   If you are short of breath or wheezing at all, please be checked in person.    From your responses in the eVisit questionnaire you describe inflammation in the upper respiratory tract which is causing a significant cough.  This is commonly called Bronchitis and has four common causes:   Allergies Viral Infections Acid Reflux Bacterial Infection Allergies, viruses and acid reflux are treated by controlling symptoms or eliminating the cause. An example might be a cough caused by taking certain blood pressure medications. You stop the cough by changing the medication. Another example might be a cough caused by acid reflux. Controlling the reflux helps control the cough.  USE OF BRONCHODILATOR (RESCUE) INHALERS: There is a risk from using your bronchodilator too frequently.  The risk is that over-reliance on a medication which only relaxes the muscles surrounding the breathing tubes can reduce the effectiveness of medications prescribed to reduce swelling and congestion of the tubes themselves.  Although you feel brief relief from the bronchodilator inhaler, your asthma may actually be worsening with the tubes becoming more swollen and filled with mucus.  This can delay other crucial treatments, such as oral steroid medications. If you need to use a bronchodilator inhaler daily, several times per day, you should discuss this with your provider.  There are probably better treatments that could be used to keep your asthma  under control.     HOME CARE Only take medications as instructed by your medical team. Complete the entire course of an antibiotic. Drink plenty of fluids and get plenty of rest. Avoid close contacts especially the very young and the elderly Cover your mouth if you cough or cough into your sleeve. Always remember to wash your hands A steam or ultrasonic humidifier can help congestion.   GET HELP RIGHT AWAY IF: You develop worsening fever. You become short of breath You cough up blood. Your symptoms persist after you have completed your treatment plan MAKE SURE YOU  Understand these instructions. Will watch your condition. Will get help right away if you are not doing well or get worse.  Your e-visit answers were reviewed by a board certified advanced clinical practitioner to complete your personal care plan.  Depending on the condition, your plan could have included both over the counter or prescription medications. If there is a problem please reply  once you have received a response from your provider. Your safety is important to us .  If you have drug allergies check your prescription carefully.    You can use MyChart to ask questions about today's visit, request a non-urgent call back, or ask for a work or school excuse for 24 hours related to this e-Visit. If it has been greater than 24 hours you will need to follow up with your provider, or enter a new e-Visit to address those concerns. You will get an e-mail in the next two days asking about your experience.  I  hope that your e-visit has been valuable and will speed your recovery. Thank you for using e-visits.  I have spent 5 minutes in review of e-visit questionnaire, review and updating patient chart, medical decision making and response to patient.   Jon Belt, PhD, FNP-BC

## 2024-07-23 ENCOUNTER — Ambulatory Visit: Admitting: Internal Medicine

## 2024-07-23 ENCOUNTER — Other Ambulatory Visit (HOSPITAL_COMMUNITY): Payer: Self-pay

## 2024-07-23 VITALS — BP 120/70 | HR 75 | Temp 98.7°F | Ht 63.0 in | Wt 204.2 lb

## 2024-07-23 DIAGNOSIS — J069 Acute upper respiratory infection, unspecified: Secondary | ICD-10-CM

## 2024-07-23 MED ORDER — PSEUDOEPH-BROMPHEN-DM 30-2-10 MG/5ML PO SYRP
5.0000 mL | ORAL_SOLUTION | Freq: Four times a day (QID) | ORAL | 0 refills | Status: AC | PRN
  Filled 2024-07-23: qty 120, 6d supply, fill #0

## 2024-07-23 NOTE — Patient Instructions (Signed)
 You can use the ear drops 3 drops 3 times a day for 3 days in the left ear.  We have refilled the cough medicine.

## 2024-07-23 NOTE — Progress Notes (Signed)
 Subjective:   Patient ID: Vanessa Byrd, female    DOB: 1975/10/22, 48 y.o.   MRN: [id number]  Discussed the use of AI scribe software for clinical note transcription with the patient, who gave verbal consent to proceed.  History of Present Illness Vanessa Byrd is a 48 year old female who presents with persistent cough and ear congestion following a diagnosis of viral bronchitis.  Approximately two weeks ago, she developed a scratchy throat after eating bread, initially attributing it to irritation. The symptoms progressed, and after a sleepover with her sisters, she lost her voice and began feeling unwell. By Sunday, she developed a dry, hacky cough that disrupted her sleep.  On Monday, she called out of work and had an e-visit where she was diagnosed with viral bronchitis and prescribed cough medicine. Despite this, her symptoms evolved to include nasal congestion and ear clogging. She now has a productive cough and experiences occasional wheezing or rattling sounds when breathing, particularly after coughing fits. No fever, but she feels cold at work, which she attributes to the weather.  She has been using Zyrtec , which seemed to help initially, and Flonase  for nasal congestion. Her nasal discharge was previously yellow and green but is now mostly clear. She has ear drops from a previous visit.  Her son is also unwell with a headache and cough, though his symptoms differ from hers.  No fever or chills. Nasal congestion with clear discharge and occasional wheezing or rattling sounds when breathing. Occasional shortness of breath, especially after coughing fits.  Review of Systems  Constitutional:  Positive for appetite change. Negative for activity change, chills, fatigue, fever and unexpected weight change.  HENT:  Positive for congestion, postnasal drip, rhinorrhea and sinus pressure. Negative for ear discharge, ear pain, sinus pain, sneezing, sore throat,  tinnitus, trouble swallowing and voice change.   Eyes: Negative.   Respiratory:  Positive for cough. Negative for chest tightness, shortness of breath and wheezing.   Cardiovascular: Negative.   Gastrointestinal: Negative.   Musculoskeletal:  Positive for myalgias.  Neurological: Negative.     Objective:  Physical Exam Constitutional:      Appearance: She is well-developed.  HENT:     Head: Normocephalic and atraumatic.     Comments: Oropharynx with redness and clear drainage, nose with swollen turbinates, TMs normal bilaterally.  Neck:     Thyroid : No thyromegaly.  Cardiovascular:     Rate and Rhythm: Normal rate and regular rhythm.  Pulmonary:     Effort: Pulmonary effort is normal. No respiratory distress.     Breath sounds: Normal breath sounds. No wheezing or rales.  Abdominal:     Palpations: Abdomen is soft.  Musculoskeletal:        General: Tenderness present.     Cervical back: Normal range of motion.  Lymphadenopathy:     Cervical: No cervical adenopathy.  Skin:    General: Skin is warm and dry.  Neurological:     Mental Status: She is alert and oriented to person, place, and time.     Vitals:   07/23/24 1515  Pulse: 75  Temp: 98.7 F (37.1 C)  TempSrc: Oral  SpO2: 98%  Weight: 204 lb 3.2 oz (92.6 kg)  Height: 5' 3 (1.6 m)    Assessment and Plan Assessment & Plan Acute viral upper respiratory infection with cough and congestion   Symptoms suggest a prolonged or sequential viral infection, presenting with productive cough, congestion, ear fullness, and occasional wheezing. Lungs  are clear with no signs of pneumonia, though fluid is present in the ear without infection. Postnasal drip may be contributing to the cough. Continue Flonase  for nasal congestion and use over-the-counter cold products as needed for symptom relief. Refill prescription cough medicine to help with sleeping. Consider Zyrtec  or Claritin for managing postnasal drip. Use ear drops in the  affected ear if necessary.

## 2024-07-24 ENCOUNTER — Encounter: Payer: Self-pay | Admitting: Internal Medicine

## 2024-08-02 ENCOUNTER — Other Ambulatory Visit (HOSPITAL_COMMUNITY): Payer: Self-pay

## 2024-08-08 NOTE — Progress Notes (Unsigned)
 Ben Jackson D[facility] [address] [place] [address] Phone: [phone]   Assessment and Plan:     1. Acute pain of left knee (Primary) 2. Primary osteoarthritis of left knee -Chronic with exacerbation, subsequent visit - Continued flare of primarily medial compartment osteoarthritis - Only mild and temporary relief with HEP, meloxicam  course, however pain has returned with physical activity such as walking a 5K - Discontinue meloxicam  as it was not significantly beneficial - Start prednisone  Dosepak - Continue HEP for knee and start physical therapy     Pertinent previous records reviewed include none   Follow Up: 6 to 8 weeks for reevaluation.  Could follow-up sooner if pain is not well-controlled to discuss intra-articular CSI   Subjective:   I, Chestine Reeves, am serving as a neurosurgeon for Doctor Morene Mace   Chief Complaint: knee pain    HPI:    07/12/2024 Patient is a 48 year old female with knee pain. Patient states left knee pain . 2 months ago she was playing badminton. Has noticed a twinge. 2 weeks ago her knee was swollen. Decreased ROM. Swelling has gone down with RICES. Pain is increasing as she goes. Pain going down steps. Pain is anterior knee. States she feels like knee needs to pop.  No numbness or tingling. Does endorse antalgic gait   08/09/2024 Patient states she is okay. She felt a pop after a 5k walk run. Thinks she just flared it   Relevant Historical Information: Prediabetes, GERD  Additional pertinent review of systems negative.  Current Medications[1]   Objective:     Vitals:   08/09/24 0832  BP: 120/74  Pulse: 78  SpO2: 98%  Weight: 203 lb (92.1 kg)  Height: 5' 3 (1.6 m)      Body mass index is 35.96 kg/m.    Physical Exam:    General:  awake, alert oriented, no acute distress nontoxic Skin: no suspicious lesions or rashes Neuro:sensation intact and strength 5/5 with no  deficits, no atrophy, normal muscle tone Psych: No signs of anxiety, depression or other mood disorder   Left knee: No swelling No deformity Neg fluid wave, joint milking ROM Flex 110, Ext 0 TTP medial joint line NTTP over the quad tendon, medial fem condyle, lat fem condyle, patella, patella tendon, tibial tuberostiy, fibular head, posterior fossa, pes anserine bursa, gerdy's tubercle,   lateral jt line Neg anterior and posterior drawer Neg lachman Negative varus stress Negative valgus stress Negative McMurray Positive Thessaly   Gait normal     Electronically signed by:  Odis Mace D[facility] 8:44 AM 08/09/2024     [1]  Current Outpatient Medications:    methylPREDNISolone  (MEDROL  DOSEPAK) 4 MG TBPK tablet, Take 6 tablets on day 1.  Take 5 tablets on day 2.  Take 4 tablets on day 3.  Take 3 tablets on day 4.  Take 2 tablets on day 5.  Take 1 tablet on day 6., Disp: 21 tablet, Rfl: 0   brompheniramine-pseudoephedrine -DM 30-2-10 MG/5ML syrup, Take 5 mLs by mouth 4 (four) times daily as needed., Disp: 120 mL, Rfl: 0   escitalopram  (LEXAPRO ) 10 MG tablet, Take 1 tablet (10 mg total) by mouth daily., Disp: 90 tablet, Rfl: 1   fluticasone  (FLONASE ) 50 MCG/ACT nasal spray, Place 2 sprays into both nostrils daily., Disp: 16 g, Rfl: 0   levothyroxine  (SYNTHROID ) 50 MCG tablet, Take 1 tablet (50 mcg total) by mouth daily. (Patient  not taking: Reported on 07/23/2024), Disp: 90 tablet, Rfl: 3   meloxicam  (MOBIC ) 15 MG tablet, Take 1 tablet (15 mg total) by mouth daily for 2 weeks.  If still in pain after 2 weeks, take 1 tablet daily for an additional 1 week., Disp: 30 tablet, Rfl: 0   Multiple Vitamin (MULTIVITAMIN) tablet, Take 1 tablet by mouth daily., Disp: , Rfl:

## 2024-08-09 ENCOUNTER — Other Ambulatory Visit (HOSPITAL_COMMUNITY): Payer: Self-pay

## 2024-08-09 ENCOUNTER — Ambulatory Visit: Admitting: Sports Medicine

## 2024-08-09 VITALS — BP 120/74 | HR 78 | Ht 63.0 in | Wt 203.0 lb

## 2024-08-09 DIAGNOSIS — M1712 Unilateral primary osteoarthritis, left knee: Secondary | ICD-10-CM

## 2024-08-09 DIAGNOSIS — M25562 Pain in left knee: Secondary | ICD-10-CM | POA: Diagnosis not present

## 2024-08-09 MED ORDER — METHYLPREDNISOLONE 4 MG PO TBPK
ORAL_TABLET | ORAL | 0 refills | Status: AC
  Filled 2024-08-09: qty 21, 6d supply, fill #0

## 2024-08-09 NOTE — Patient Instructions (Signed)
 PT referral   Stop meloxicam    Prednisone  dos pak  6-8 week follow up or sooner if you want an injection
